# Patient Record
Sex: Female | Born: 1940 | Race: White | Hispanic: Yes | Smoking: Former smoker
Health system: Southern US, Community
[De-identification: ages and names within clinical notes are randomized; demographics above are authoritative.]

## PROBLEM LIST (undated history)

## (undated) DIAGNOSIS — M797 Fibromyalgia: Secondary | ICD-10-CM

## (undated) DIAGNOSIS — K279 Peptic ulcer, site unspecified, unspecified as acute or chronic, without hemorrhage or perforation: Secondary | ICD-10-CM

## (undated) DIAGNOSIS — C8590 Non-Hodgkin lymphoma, unspecified, unspecified site: Secondary | ICD-10-CM

## (undated) DIAGNOSIS — G939 Disorder of brain, unspecified: Secondary | ICD-10-CM

## (undated) DIAGNOSIS — R519 Headache, unspecified: Secondary | ICD-10-CM

## (undated) DIAGNOSIS — G4700 Insomnia, unspecified: Secondary | ICD-10-CM

## (undated) DIAGNOSIS — R51 Headache: Secondary | ICD-10-CM

## (undated) DIAGNOSIS — E785 Hyperlipidemia, unspecified: Secondary | ICD-10-CM

## (undated) DIAGNOSIS — L409 Psoriasis, unspecified: Secondary | ICD-10-CM

## (undated) DIAGNOSIS — R001 Bradycardia, unspecified: Secondary | ICD-10-CM

## (undated) DIAGNOSIS — R778 Other specified abnormalities of plasma proteins: Secondary | ICD-10-CM

## (undated) DIAGNOSIS — D649 Anemia, unspecified: Secondary | ICD-10-CM

## (undated) DIAGNOSIS — M519 Unspecified thoracic, thoracolumbar and lumbosacral intervertebral disc disorder: Secondary | ICD-10-CM

## (undated) DIAGNOSIS — J309 Allergic rhinitis, unspecified: Secondary | ICD-10-CM

## (undated) DIAGNOSIS — I493 Ventricular premature depolarization: Secondary | ICD-10-CM

## (undated) DIAGNOSIS — F329 Major depressive disorder, single episode, unspecified: Secondary | ICD-10-CM

## (undated) DIAGNOSIS — F32A Depression, unspecified: Secondary | ICD-10-CM

## (undated) DIAGNOSIS — J341 Cyst and mucocele of nose and nasal sinus: Secondary | ICD-10-CM

## (undated) DIAGNOSIS — N390 Urinary tract infection, site not specified: Secondary | ICD-10-CM

## (undated) DIAGNOSIS — R5382 Chronic fatigue, unspecified: Secondary | ICD-10-CM

## (undated) DIAGNOSIS — F419 Anxiety disorder, unspecified: Secondary | ICD-10-CM

## (undated) DIAGNOSIS — M1990 Unspecified osteoarthritis, unspecified site: Secondary | ICD-10-CM

## (undated) DIAGNOSIS — T7840XA Allergy, unspecified, initial encounter: Secondary | ICD-10-CM

## (undated) DIAGNOSIS — R350 Frequency of micturition: Secondary | ICD-10-CM

## (undated) HISTORY — DX: Disorder of brain, unspecified: G93.9

## (undated) HISTORY — DX: Depression, unspecified: F32.A

## (undated) HISTORY — DX: Ventricular premature depolarization: I49.3

## (undated) HISTORY — DX: Fibromyalgia: M79.7

## (undated) HISTORY — DX: Headache, unspecified: R51.9

## (undated) HISTORY — DX: Headache: R51

## (undated) HISTORY — DX: Other specified abnormalities of plasma proteins: R77.8

## (undated) HISTORY — DX: Bradycardia, unspecified: R00.1

## (undated) HISTORY — PX: APPENDECTOMY: SHX54

## (undated) HISTORY — DX: Major depressive disorder, single episode, unspecified: F32.9

## (undated) HISTORY — DX: Non-Hodgkin lymphoma, unspecified, unspecified site: C85.90

## (undated) HISTORY — DX: Allergic rhinitis, unspecified: J30.9

## (undated) HISTORY — DX: Chronic fatigue, unspecified: R53.82

## (undated) HISTORY — DX: Cyst and mucocele of nose and nasal sinus: J34.1

## (undated) HISTORY — PX: ABDOMINAL HYSTERECTOMY: SHX81

## (undated) HISTORY — DX: Anxiety disorder, unspecified: F41.9

## (undated) HISTORY — DX: Allergy, unspecified, initial encounter: T78.40XA

## (undated) HISTORY — DX: Insomnia, unspecified: G47.00

## (undated) HISTORY — DX: Peptic ulcer, site unspecified, unspecified as acute or chronic, without hemorrhage or perforation: K27.9

## (undated) HISTORY — DX: Hyperlipidemia, unspecified: E78.5

## (undated) HISTORY — DX: Unspecified osteoarthritis, unspecified site: M19.90

## (undated) HISTORY — DX: Anemia, unspecified: D64.9

## (undated) HISTORY — DX: Frequency of micturition: R35.0

## (undated) HISTORY — DX: Urinary tract infection, site not specified: N39.0

## (undated) HISTORY — DX: Unspecified thoracic, thoracolumbar and lumbosacral intervertebral disc disorder: M51.9

## (undated) HISTORY — DX: Psoriasis, unspecified: L40.9

---

## 2011-11-12 ENCOUNTER — Telehealth: Payer: Self-pay | Admitting: Internal Medicine

## 2011-11-12 NOTE — Telephone Encounter (Signed)
Message copied by Etheleen Sia on Tue Nov 12, 2011  8:25 AM ------      Message from: Etheleen Sia      Created: Tue Nov 05, 2011 11:36 AM      Regarding: FW: NEW PT       Phone didn't work.  Mailed a letter 6-11      ----- Message -----         From: Corwin Levins, MD         Sent: 11/04/2011   1:43 PM           To: Etheleen Sia      Subject: RE: NEW PT                                               Ok with me      ----- Message -----         From: Etheleen Sia         Sent: 11/04/2011   1:24 PM           To: Corwin Levins, MD      Subject: NEW PT                                                   Belinda Mcdowell would like to be worked in as a new [employer] patient before she runs out of medicine at the end of this month.  She moved from [place] at the end of May.

## 2011-11-15 ENCOUNTER — Encounter: Payer: Self-pay | Admitting: Internal Medicine

## 2011-11-15 DIAGNOSIS — Z0000 Encounter for general adult medical examination without abnormal findings: Secondary | ICD-10-CM | POA: Insufficient documentation

## 2011-11-18 ENCOUNTER — Encounter: Payer: Self-pay | Admitting: Internal Medicine

## 2011-11-18 ENCOUNTER — Other Ambulatory Visit (INDEPENDENT_AMBULATORY_CARE_PROVIDER_SITE_OTHER): Payer: Self-pay

## 2011-11-18 ENCOUNTER — Ambulatory Visit (INDEPENDENT_AMBULATORY_CARE_PROVIDER_SITE_OTHER): Payer: Medicare Other | Admitting: Internal Medicine

## 2011-11-18 VITALS — BP 132/80 | HR 57 | Temp 98.1°F | Ht 61.0 in | Wt 150.4 lb

## 2011-11-18 DIAGNOSIS — D649 Anemia, unspecified: Secondary | ICD-10-CM

## 2011-11-18 DIAGNOSIS — F329 Major depressive disorder, single episode, unspecified: Secondary | ICD-10-CM | POA: Insufficient documentation

## 2011-11-18 DIAGNOSIS — F32A Depression, unspecified: Secondary | ICD-10-CM | POA: Insufficient documentation

## 2011-11-18 DIAGNOSIS — C8589 Other specified types of non-Hodgkin lymphoma, extranodal and solid organ sites: Secondary | ICD-10-CM

## 2011-11-18 DIAGNOSIS — K279 Peptic ulcer, site unspecified, unspecified as acute or chronic, without hemorrhage or perforation: Secondary | ICD-10-CM

## 2011-11-18 DIAGNOSIS — G4700 Insomnia, unspecified: Secondary | ICD-10-CM | POA: Insufficient documentation

## 2011-11-18 DIAGNOSIS — Z8572 Personal history of non-Hodgkin lymphomas: Secondary | ICD-10-CM | POA: Insufficient documentation

## 2011-11-18 DIAGNOSIS — M797 Fibromyalgia: Secondary | ICD-10-CM

## 2011-11-18 DIAGNOSIS — F419 Anxiety disorder, unspecified: Secondary | ICD-10-CM | POA: Insufficient documentation

## 2011-11-18 DIAGNOSIS — I4891 Unspecified atrial fibrillation: Secondary | ICD-10-CM

## 2011-11-18 DIAGNOSIS — C8590 Non-Hodgkin lymphoma, unspecified, unspecified site: Secondary | ICD-10-CM

## 2011-11-18 DIAGNOSIS — J309 Allergic rhinitis, unspecified: Secondary | ICD-10-CM

## 2011-11-18 DIAGNOSIS — M519 Unspecified thoracic, thoracolumbar and lumbosacral intervertebral disc disorder: Secondary | ICD-10-CM | POA: Insufficient documentation

## 2011-11-18 DIAGNOSIS — E785 Hyperlipidemia, unspecified: Secondary | ICD-10-CM | POA: Insufficient documentation

## 2011-11-18 DIAGNOSIS — L409 Psoriasis, unspecified: Secondary | ICD-10-CM | POA: Insufficient documentation

## 2011-11-18 DIAGNOSIS — Z0000 Encounter for general adult medical examination without abnormal findings: Secondary | ICD-10-CM

## 2011-11-18 DIAGNOSIS — R002 Palpitations: Secondary | ICD-10-CM | POA: Insufficient documentation

## 2011-11-18 DIAGNOSIS — M1990 Unspecified osteoarthritis, unspecified site: Secondary | ICD-10-CM | POA: Insufficient documentation

## 2011-11-18 DIAGNOSIS — J341 Cyst and mucocele of nose and nasal sinus: Secondary | ICD-10-CM

## 2011-11-18 DIAGNOSIS — Z79899 Other long term (current) drug therapy: Secondary | ICD-10-CM

## 2011-11-18 HISTORY — DX: Psoriasis, unspecified: L40.9

## 2011-11-18 HISTORY — DX: Unspecified osteoarthritis, unspecified site: M19.90

## 2011-11-18 HISTORY — DX: Cyst and mucocele of nose and nasal sinus: J34.1

## 2011-11-18 HISTORY — DX: Fibromyalgia: M79.7

## 2011-11-18 HISTORY — DX: Anemia, unspecified: D64.9

## 2011-11-18 HISTORY — DX: Non-Hodgkin lymphoma, unspecified, unspecified site: C85.90

## 2011-11-18 HISTORY — DX: Unspecified thoracic, thoracolumbar and lumbosacral intervertebral disc disorder: M51.9

## 2011-11-18 HISTORY — DX: Peptic ulcer, site unspecified, unspecified as acute or chronic, without hemorrhage or perforation: K27.9

## 2011-11-18 LAB — LIPID PANEL
HDL: 44 mg/dL (ref >39.00)
Triglycerides: 274 mg/dL — ABNORMAL HIGH (ref 0.0–149.0)

## 2011-11-18 LAB — CBC WITH DIFFERENTIAL/PLATELET
Basophils Relative: 0.8 % (ref 0.0–3.0)
Eosinophils Relative: 4.5 % (ref 0.0–5.0)
HCT: 40.5 % (ref 36.0–46.0)
Hemoglobin: 13.6 g/dL (ref 12.0–15.0)
Lymphs Abs: 1.7 10*3/uL (ref 0.7–4.0)
Monocytes Relative: 7.6 % (ref 3.0–12.0)
Neutro Abs: 4.4 10*3/uL (ref 1.4–7.7)
WBC: 7.1 10*3/uL (ref 4.5–10.5)

## 2011-11-18 LAB — BASIC METABOLIC PANEL
BUN: 15 mg/dL (ref 6–23)
Calcium: 9.8 mg/dL (ref 8.4–10.5)
GFR: 90.41 mL/min (ref >60.00)
Glucose, Bld: 92 mg/dL (ref 70–99)
Sodium: 139 mEq/L (ref 135–145)

## 2011-11-18 LAB — HEPATIC FUNCTION PANEL
Albumin: 4.1 g/dL (ref 3.5–5.2)
Alkaline Phosphatase: 69 U/L (ref 39–117)

## 2011-11-18 LAB — URINALYSIS, ROUTINE W REFLEX MICROSCOPIC
Ketones, ur: NEGATIVE
Specific Gravity, Urine: 1.02 (ref 1.000–1.030)
Urine Glucose: NEGATIVE
pH: 6 (ref 5.0–8.0)

## 2011-11-18 LAB — LDL CHOLESTEROL, DIRECT: Direct LDL: 80.1 mg/dL

## 2011-11-18 MED ORDER — CYCLOBENZAPRINE HCL 5 MG PO TABS: 5.0000 mg | ORAL_TABLET | Freq: Three times a day (TID) | ORAL | Status: DC | PRN

## 2011-11-18 MED ORDER — ATENOLOL 50 MG PO TABS: 50.0000 mg | ORAL_TABLET | Freq: Every day | ORAL | Status: DC

## 2011-11-18 MED ORDER — ASPIRIN 81 MG PO TBEC: 81.0000 mg | DELAYED_RELEASE_TABLET | Freq: Every day | ORAL | Status: AC

## 2011-11-18 MED ORDER — FAMOTIDINE 20 MG PO TABS: 20.0000 mg | ORAL_TABLET | Freq: Two times a day (BID) | ORAL | Status: DC

## 2011-11-18 MED ORDER — FEXOFENADINE HCL 180 MG PO TABS: 180.0000 mg | ORAL_TABLET | Freq: Every day | ORAL | Status: DC

## 2011-11-18 MED ORDER — MECLIZINE HCL 25 MG PO TABS: 25.0000 mg | ORAL_TABLET | Freq: Three times a day (TID) | ORAL | Status: DC | PRN

## 2011-11-18 MED ORDER — ZOLPIDEM TARTRATE 10 MG PO TABS: 10.0000 mg | ORAL_TABLET | Freq: Every evening | ORAL | Status: DC | PRN

## 2011-11-18 MED ORDER — PAROXETINE HCL 20 MG PO TABS: 20.0000 mg | ORAL_TABLET | ORAL | Status: DC

## 2011-11-18 MED ORDER — ATORVASTATIN CALCIUM 20 MG PO TABS: 20.0000 mg | ORAL_TABLET | Freq: Every day | ORAL | Status: DC

## 2011-11-18 MED ORDER — ERGOTAMINE-CAFFEINE 1-100 MG PO TABS: ORAL_TABLET | ORAL | Status: DC

## 2011-11-18 MED ORDER — ALPRAZOLAM 0.5 MG PO TABS: 0.5000 mg | ORAL_TABLET | Freq: Two times a day (BID) | ORAL | Status: DC | PRN

## 2011-11-18 MED ORDER — DIGOXIN 250 MCG PO TABS: 250.0000 ug | ORAL_TABLET | Freq: Every day | ORAL | Status: DC

## 2011-11-18 NOTE — Patient Instructions (Addendum)
Please call with the name of your mail-in pharmacy and ask the nurse to send 90 day refills You are given the short term refills today, including alprazolam  You are up to date with prevention today Continue all other medications as before You will be contacted regarding the referral for: cardiology, and oncology Please go to LAB in the Basement for the blood and/or urine tests to be done today You will be contacted by phone if any changes need to be made immediately.  Otherwise, you will receive a letter about your results with an explanation. Please return in 6 mo with Lab testing done 3-5 days before

## 2011-11-18 NOTE — Assessment & Plan Note (Signed)

## 2011-11-19 ENCOUNTER — Encounter: Payer: Self-pay | Admitting: Internal Medicine

## 2011-11-21 ENCOUNTER — Other Ambulatory Visit: Payer: Self-pay

## 2011-11-21 ENCOUNTER — Telehealth: Payer: Self-pay | Admitting: Hematology and Oncology

## 2011-11-21 DIAGNOSIS — J309 Allergic rhinitis, unspecified: Secondary | ICD-10-CM

## 2011-11-21 MED ORDER — CYCLOBENZAPRINE HCL 5 MG PO TABS: 5.0000 mg | ORAL_TABLET | Freq: Three times a day (TID) | ORAL | Status: DC | PRN

## 2011-11-21 MED ORDER — ALPRAZOLAM 0.5 MG PO TABS: 0.5000 mg | ORAL_TABLET | Freq: Two times a day (BID) | ORAL | Status: AC | PRN

## 2011-11-21 MED ORDER — PAROXETINE HCL 20 MG PO TABS: 20.0000 mg | ORAL_TABLET | ORAL | Status: DC

## 2011-11-21 MED ORDER — MECLIZINE HCL 25 MG PO TABS: 25.0000 mg | ORAL_TABLET | Freq: Three times a day (TID) | ORAL | Status: DC | PRN

## 2011-11-21 MED ORDER — FEXOFENADINE HCL 180 MG PO TABS: 180.0000 mg | ORAL_TABLET | Freq: Every day | ORAL | Status: DC

## 2011-11-21 MED ORDER — FAMOTIDINE 20 MG PO TABS: 20.0000 mg | ORAL_TABLET | Freq: Two times a day (BID) | ORAL | Status: DC

## 2011-11-21 MED ORDER — DIGOXIN 250 MCG PO TABS: 250.0000 ug | ORAL_TABLET | Freq: Every day | ORAL | Status: DC

## 2011-11-21 MED ORDER — ATORVASTATIN CALCIUM 20 MG PO TABS: 20.0000 mg | ORAL_TABLET | Freq: Every day | ORAL | Status: DC

## 2011-11-21 MED ORDER — ZOLPIDEM TARTRATE 10 MG PO TABS: 10.0000 mg | ORAL_TABLET | Freq: Every evening | ORAL | Status: DC | PRN

## 2011-11-21 MED ORDER — ATENOLOL 50 MG PO TABS: 50.0000 mg | ORAL_TABLET | Freq: Every day | ORAL | Status: DC

## 2011-11-21 NOTE — Telephone Encounter (Signed)
Done hardcopy to robin  

## 2011-11-21 NOTE — Telephone Encounter (Signed)
S/w pt re appt for 7/16 @ 1 pm w/LO. D/t per pt.

## 2011-11-21 NOTE — Telephone Encounter (Signed)
Faxed hardcopy to pharmacy. 

## 2011-11-22 ENCOUNTER — Telehealth: Payer: Self-pay

## 2011-11-22 NOTE — Telephone Encounter (Signed)
Pharmacy informed.

## 2011-11-22 NOTE — Telephone Encounter (Signed)
Pharmacy sent fax requesting clarification on Zolpidem 10 mg.  Pharmacy stating medication prescribed is above recommended daily dosing. Please advise if ok to fill

## 2011-11-22 NOTE — Telephone Encounter (Signed)
Ok this time 

## 2011-11-24 ENCOUNTER — Encounter: Payer: Self-pay | Admitting: Internal Medicine

## 2011-11-24 NOTE — Assessment & Plan Note (Signed)
For oncology referral

## 2011-11-24 NOTE — Progress Notes (Signed)
Subjective:    Patient ID: Belinda Mcdowell, female    DOB: 02-22-1941, 71 y.o.   MRN: [id number]  HPI  Here for wellness and f/u;  Overall doing ok;  Pt denies CP, worsening SOB, DOE, wheezing, orthopnea, PND, worsening LE edema, palpitations, dizziness or syncope.  Pt denies neurological change such as new Headache, facial or extremity weakness.  Pt denies polydipsia, polyuria, or low sugar symptoms. Pt states overall good compliance with treatment and medications, good tolerability, and trying to follow lower cholesterol diet.  Pt denies worsening depressive symptoms, suicidal ideation or panic. No fever, wt loss, night sweats, loss of appetite, or other constitutional symptoms.  Pt states good ability with ADL's, low fall risk, home safety reviewed and adequate, no significant changes in hearing or vision, and occasionally active with exercise.  Needs local referral so establish with cardiology/oncology.  Does have several wks ongoing nasal allergy symptoms with clear congestion, itch and sneeze. Past Medical History  Diagnosis Date  . Cancer   . Generalized headaches   . UTI (lower urinary tract infection)   . Allergic rhinitis, cause unspecified   . Arthritis   . Depression   . Insomnia   . Hyperlipidemia   . Anxiety   . Non-Hodgkin lymphoma 11/18/2011    Stage 3  . Atrial fibrillation   . Fibromyalgia 11/18/2011  . Mucous retention cyst of maxillary sinus 11/18/2011    Right   . Psoriasis 11/18/2011    On MTX  . Anemia, unspecified 11/18/2011    Prior on aranesp  . PUD (peptic ulcer disease) 11/18/2011    Duodenal at age 89 and 8  . Degenerative joint disease 11/18/2011    diffuse  . Lumbar disc disease 11/18/2011   Past Surgical History  Procedure Date  . Appendectomy   . Abdominal hysterectomy     for dysfunctional bleeding    reports that she has never smoked. She has never used smokeless tobacco. She reports that she does not drink alcohol or use illicit drugs. family  history includes Arthritis in her other. No Known Allergies Current Outpatient Prescriptions on File Prior to Visit  Medication Sig Dispense Refill  . atenolol (TENORMIN) 50 MG tablet Take 1 tablet (50 mg total) by mouth daily.  90 tablet  3  . atorvastatin (LIPITOR) 20 MG tablet Take 1 tablet (20 mg total) by mouth daily.  90 tablet  3  . digoxin (LANOXIN) 0.25 MG tablet Take 1 tablet (250 mcg total) by mouth daily.  90 tablet  3  . ergotamine-caffeine (CAFERGOT) 1-100 MG per tablet Two tablets at onset of attack; then 1 tablet every 30 minutes as needed; maximum: 6 tablets per attack; do not exceed 10 tablets/week  100 tablet  0  . famotidine (PEPCID) 20 MG tablet Take 1 tablet (20 mg total) by mouth 2 (two) times daily.  180 tablet  3  . fexofenadine (ALLEGRA) 180 MG tablet Take 1 tablet (180 mg total) by mouth daily.  90 tablet  3  . PARoxetine (PAXIL) 20 MG tablet Take 1 tablet (20 mg total) by mouth every morning.  90 tablet  3  . zolpidem (AMBIEN) 10 MG tablet Take 1 tablet (10 mg total) by mouth at bedtime as needed.  30 tablet  5   Review of Systems Review of Systems  Constitutional: Negative for diaphoresis, activity change, appetite change and unexpected weight change.  HENT: Negative for hearing loss, ear pain, facial swelling, mouth sores and neck stiffness.  Eyes: Negative for pain, redness and visual disturbance.  Respiratory: Negative for shortness of breath and wheezing.   Cardiovascular: Negative for chest pain and palpitations.  Gastrointestinal: Negative for diarrhea, blood in stool, abdominal distention and rectal pain.  Genitourinary: Negative for hematuria, flank pain and decreased urine volume.  Musculoskeletal: Negative for myalgias and joint swelling.  Skin: Negative for color change and wound.  Neurological: Negative for syncope and numbness.  Hematological: Negative for adenopathy.  Psychiatric/Behavioral: Negative for hallucinations, self-injury, decreased  concentration and agitation.     Objective:   Physical Exam BP 132/80  Pulse 57  Temp 98.1 F (36.7 C) (Oral)  Ht 5\' 1"  (1.549 m)  Wt 150 lb 6 oz (68.21 kg)  BMI 28.41 kg/m2  SpO2 97% Physical Exam  VS noted Constitutional: Pt is oriented to person, place, and time. Appears well-developed and well-nourished.  HENT:  Head: Normocephalic and atraumatic.  Right Ear: External ear normal.  Left Ear: External ear normal.  Nose: Nose normal.  Mouth/Throat: Oropharynx is clear and moist.  Eyes: Conjunctivae and EOM are normal. Pupils are equal, round, and reactive to light.  Neck: Normal range of motion. Neck supple. No JVD present. No tracheal deviation present.  Cardiovascular: Normal rate, irregular rhythm, normal heart sounds and intact distal pulses.   Pulmonary/Chest: Effort normal and breath sounds normal.  Abdominal: Soft. Bowel sounds are normal. There is no tenderness.  Musculoskeletal: Normal range of motion. Exhibits no edema.  Lymphadenopathy:  Has no cervical adenopathy.  Neurological: Pt is alert and oriented to person, place, and time. Pt has normal reflexes. No cranial nerve deficit.  Skin: Skin is warm and dry. No rash noted.  Psychiatric:  Has  normal mood and affect. Behavior is normal.     Assessment & Plan:

## 2011-11-24 NOTE — Assessment & Plan Note (Signed)
stable overall by hx and exam, , and pt to continue medical treatment as before, for card referral

## 2011-11-24 NOTE — Assessment & Plan Note (Signed)
For allegra otc prn 

## 2011-11-26 ENCOUNTER — Telehealth: Payer: Self-pay | Admitting: Hematology and Oncology

## 2011-11-26 ENCOUNTER — Other Ambulatory Visit: Payer: Self-pay

## 2011-11-26 MED ORDER — CYCLOBENZAPRINE HCL 5 MG PO TABS: 5.0000 mg | ORAL_TABLET | Freq: Three times a day (TID) | ORAL | Status: AC | PRN

## 2011-11-26 MED ORDER — ERGOTAMINE-CAFFEINE 1-100 MG PO TABS: ORAL_TABLET | ORAL | Status: DC

## 2011-11-26 NOTE — Telephone Encounter (Signed)
Referred by Dr. Oliver Barre Dx- NHL

## 2011-11-27 ENCOUNTER — Telehealth: Payer: Self-pay

## 2011-11-27 DIAGNOSIS — L409 Psoriasis, unspecified: Secondary | ICD-10-CM

## 2011-11-27 MED ORDER — PREDNISONE 10 MG PO TABS: ORAL_TABLET | ORAL | Status: DC

## 2011-11-27 NOTE — Telephone Encounter (Signed)
Done per emr 

## 2011-11-27 NOTE — Telephone Encounter (Signed)
Patient informed. 

## 2011-11-27 NOTE — Telephone Encounter (Signed)
Pt called requesting prednisone taper pack for psoriasis flare of the face. Pt says she has taken Methotrexate for this but it has not helped. She is also requesting referral to Rheumatology.

## 2011-12-06 ENCOUNTER — Telehealth: Payer: Self-pay

## 2011-12-06 NOTE — Telephone Encounter (Signed)
A user error has taken place: encounter opened in error, closed for administrative reasons.

## 2011-12-10 ENCOUNTER — Encounter: Payer: Self-pay | Admitting: Hematology and Oncology

## 2011-12-10 ENCOUNTER — Ambulatory Visit: Payer: Medicare Other

## 2011-12-10 ENCOUNTER — Telehealth: Payer: Self-pay | Admitting: Hematology and Oncology

## 2011-12-10 ENCOUNTER — Ambulatory Visit (HOSPITAL_BASED_OUTPATIENT_CLINIC_OR_DEPARTMENT_OTHER): Payer: Medicare Other | Admitting: Hematology and Oncology

## 2011-12-10 VITALS — BP 150/76 | HR 53 | Temp 97.9°F | Ht 61.0 in | Wt 149.1 lb

## 2011-12-10 DIAGNOSIS — C8291 Follicular lymphoma, unspecified, lymph nodes of head, face, and neck: Secondary | ICD-10-CM

## 2011-12-10 DIAGNOSIS — E7800 Pure hypercholesterolemia, unspecified: Secondary | ICD-10-CM

## 2011-12-10 DIAGNOSIS — M359 Systemic involvement of connective tissue, unspecified: Secondary | ICD-10-CM

## 2011-12-10 DIAGNOSIS — C8590 Non-Hodgkin lymphoma, unspecified, unspecified site: Secondary | ICD-10-CM

## 2011-12-10 DIAGNOSIS — I4891 Unspecified atrial fibrillation: Secondary | ICD-10-CM

## 2011-12-10 NOTE — Progress Notes (Signed)
Dr.    Daneil Dan     -     Oncologist/Hematologist  In  [place]. Dr.    Oliver Barre      -    Primary. Dr.    Dareen Piano      -     Rheumatologist.  [facility]   On   [address]  Prefers   Home  Phone  For   Voice   Mail      [phone].

## 2011-12-10 NOTE — Telephone Encounter (Signed)
appts made and printed for pt aom °

## 2011-12-10 NOTE — Progress Notes (Signed)
This office note has been dictated.

## 2011-12-10 NOTE — Patient Instructions (Addendum)
Belinda Mcdowell  [id number]  [facility] Discharge Instructions  RECOMMENDATIONS MADE BY THE CONSULTANT AND ANY TEST RESULTS WILL BE SENT TO YOUR REFERRING DOCTOR.   EXAM FINDINGS BY MD TODAY AND SIGNS AND SYMPTOMS TO REPORT TO CLINIC OR PRIMARY MD:   Your current list of medications are: Current Outpatient Prescriptions  Medication Sig Dispense Refill  . ALPRAZolam (XANAX) 0.5 MG tablet Take 1 tablet (0.5 mg total) by mouth 2 (two) times daily as needed for sleep.  60 tablet  5  . aspirin 81 MG EC tablet Take 1 tablet (81 mg total) by mouth daily. Swallow whole.  30 tablet  12  . atenolol (TENORMIN) 50 MG tablet Take 1 tablet (50 mg total) by mouth daily.  90 tablet  3  . atorvastatin (LIPITOR) 20 MG tablet Take 1 tablet (20 mg total) by mouth daily.  90 tablet  3  . digoxin (LANOXIN) 0.25 MG tablet Take 1 tablet (250 mcg total) by mouth daily.  90 tablet  3  . ergotamine-caffeine (CAFERGOT) 1-100 MG per tablet Two tablets at onset of attack; then 1 tablet every 30 minutes as needed; maximum: 6 tablets per attack; do not exceed 10 tablets/week  100 tablet  0  . famotidine (PEPCID) 20 MG tablet Take 1 tablet (20 mg total) by mouth 2 (two) times daily.  180 tablet  3  . fexofenadine (ALLEGRA) 180 MG tablet Take 1 tablet (180 mg total) by mouth daily.  90 tablet  3  . meclizine (ANTIVERT) 25 MG tablet Take 1 tablet (25 mg total) by mouth 3 (three) times daily as needed.  180 tablet  3  . PARoxetine (PAXIL) 20 MG tablet Take 1 tablet (20 mg total) by mouth every morning.  90 tablet  3  . predniSONE (DELTASONE) 10 MG tablet 3 tabs by mouth per day for 3 days,2tabs per day for 3 days,1tab per day for 3 days  18 tablet  0  . zolpidem (AMBIEN) 10 MG tablet Take 1 tablet (10 mg total) by mouth at bedtime as needed.  30 tablet  5     INSTRUCTIONS GIVEN AND DISCUSSED:   SPECIAL INSTRUCTIONS/FOLLOW-UP:  See above.  I acknowledge that I have been informed and understand all the  instructions given to me and received a copy. I do not have any more questions at this time, but understand that I may call the [facility] at [phone] during business hours should I have any further questions or need assistance in obtaining follow-up care.

## 2011-12-11 NOTE — Progress Notes (Signed)
CC:   Corwin Levins, MD Ann Maki, MD, Fax [phone] Azzie Roup, MD  IDENTIFYING STATEMENT:  The patient is a 71 year old woman seen at the request of Dr. Jonny Ruiz with lymphoma.  HISTORY OF PRESENT ILLNESS:  The patient relocated to the [place] area from [place], [place], 2 months ago to be close to family.  She presents for continued oncology care.  After reviewing her chart, her history is summarized as follows:  She presented in 2005 with a large right mandibular mass.  She underwent an excision of the mass at the right cheek and mandibular area.  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  Staging PET scan had shown bilateral cervical lymphadenopathy, abnormal uptake in small bilateral axillary lymph nodes, abnormal bilateral pelvic lymphadenopathy.  She was staged as a stage III.  She was treated with 8 cycles of Rituxan, fludarabine, Novantrone, Decadron, completing therapy in March 2006.  Thereafter, she received 2 years of maintenance Rituxan therapy.  The patient reports that the treatment process was complicated with pancytopenia.  Otherwise, she did reasonably well.  Her last staging PET scan on 05/02/2011 done in [place] revealed no evidence of disease.  Since her move, the patient has noticed she feels remarkably well.  She feels that the [place] "weather" suits her very well. She has no fever, chills, or night sweats.  She has not lost any weight. She denies new adenopathy.  PAST MEDICAL HISTORY: 1. Autoimmune disease on methotrexate.  2.  History of     hypercholesteremia.  3.  History of seasonal allergies.  4.  Atrial     fibrillation.  5.  Tinnitus.  6.  History of duodenal ulcers at age     44 and 49.  7.  Status post total abdominal hysterectomy for     uterine fibroids.  ALLERGIES:  None.  MEDICATIONS:  Lipitor 20 mg daily, Paxil 20 mg daily, atenolol 50 mg daily, digoxin 50 mcg daily, aspirin 81 mg  daily, Xanax 0.5 mg t.i.d. p.r.n., Cafergot 1/100 p.r.n. for migraines, Pepcid 20 mg b.i.d., Allegra 180 mg p.r.n., Antivert 25 mg t.i.d. p.r.n., prednisone 10 mg daily, Ambien 10 mg daily p.r.n., methotrexate.  SOCIAL HISTORY:  The patient is widowed with 3 children.  One of her children lives in [place].  She denies alcohol or tobacco use.  She is a retired Engineer, site.  FAMILY HISTORY:  Positive for liver cancer in a paternal aunt, otherwise negative.  HEALTH MAINTENANCE:  Last colonoscopy performed around 2010 in [place]. The patient reports this as being unremarkable.  Mammogram:  She is due for mammogram.  REVIEW OF SYSTEMS:  Denies fever, chills, night sweats, anorexia, adenopathy pain.  GI:  Denies nausea, vomiting, abdominal pain, diarrhea, melena, hematochezia.  Cardiovascular:  Denies chest pain, PND, orthopnea, ankle swelling.  Respiration:  Denies cough, hemoptysis, wheeze, shortness of breath.  Skin:  Denies bruising, bleeding. Musculoskeletal:  Denies joint aches, muscle pains.  Neurologic:  Denies headaches, vision change, extremity weakness.  Rest of review of systems negative.  PHYSICAL EXAMINATION:  General:  The patient is a well-appearing, well- nourished woman in no distress.  Vitals:  Pulse 53, blood pressure 150/76, temperature 97.9, respirations 20, weight 149 pounds.  HEENT: Head is atraumatic, normocephalic.  Sclerae anicteric.  Pupils equal, round, and reactive to light.  Mouth moist without ulcerations, thrush, or lesions.  Neck:  Supple.  Chest:  Clear to both percussion and auscultation.  Cardiovascular:  1st and 2nd heart sounds present.  No added sounds or murmurs.  Abdomen:  Soft, nontender.  No palpable masses.  No hepatomegaly.  Bowel sounds present.  Lymph Nodes:  No palpable cervical, axilla, or inguinal adenopathy.  Skin:  No bruising or petechiae.  Extremities:  Trace 1 edema bilaterally with no calf tenderness.  CNS:   Nonfocal.  LABORATORY DATA:  Performed at Dr. Melvyn Novas office on 11/18/2011:  White cell count 7.1, hemoglobin 13.6, hematocrit 40.5, platelets 154.  Sodium 139, potassium 5.2, chloride 101, CO2 30, BUN 15, creatinine 0.7, glucose 92, T Bili 0.7, alkaline phosphatase 69, AST 45, ALT 36, LDH pending.  IMPRESSION AND PLAN:  Mrs. Omura is a pleasant 71 year old woman with history of a stage III, grade 1 follicular lymphoma diagnosed in 2005, diagnosed and treated in [place].  Received 8 cycles of Rituxan with fludarabine, Novantrone, and Decadron until March 2006 achieving a complete remission.  Then went on to receive 2 years of Rituxan therapy. Her last staging PET 6 months ago showed no evidence of disease.  Her current followup exam and blood work also indicates no evidence of disease.  At this point, she has been receiving PET scans annually. She has been NED for at least 8 years.  Reasonable to obtain a CT of the neck, chest, abdomen, and pelvis in 6 months' time.  If she continues to have no evidence to disease, would recommend scaling out radiographic studies and just proceeding with routine clinic visit.  Health maintenance notes she is due for a gynecological evaluation.  I will refer her to see Dr. Reynaldo Minium.  I have stressed the importance of healthy diet and exercise.  We spent more than half the time in clinical care and discussion.  She had a number of questions answered.  She follows up in 6 months' time.    ______________________________ Laurice Record, M.D. LIO/MEDQ  D:  12/10/2011  T:  12/10/2011  Job:  [id number]

## 2011-12-12 ENCOUNTER — Telehealth: Payer: Self-pay

## 2011-12-12 NOTE — Telephone Encounter (Signed)
[facility] called to inform of contraindication for the patient receiving Cafergot with history of MI/ heart disease.  The patient states she has always gotten this medication, but pharmacy will not fill until ok by MD.  Please advise if ok for pharmacy to fill with knowledge of contraindication.  Call back number is [phone] and ref. Number is [id number] PLEASE ADVISE as patient upset as she has not received this medication.

## 2011-12-12 NOTE — Telephone Encounter (Signed)
Ok for fill?

## 2011-12-12 NOTE — Telephone Encounter (Signed)
[facility] informed ok to fill per MD instructions.  Called the patient informed prescription problem has been taken care of and medication will be mailed asap per [facility] pharmacist.

## 2011-12-19 ENCOUNTER — Ambulatory Visit: Payer: Medicare Other | Admitting: Internal Medicine

## 2012-01-06 ENCOUNTER — Ambulatory Visit: Payer: Medicare Other | Admitting: Gynecology

## 2012-02-07 ENCOUNTER — Ambulatory Visit (INDEPENDENT_AMBULATORY_CARE_PROVIDER_SITE_OTHER): Payer: Medicare Other | Admitting: Internal Medicine

## 2012-02-07 ENCOUNTER — Encounter: Payer: Self-pay | Admitting: Internal Medicine

## 2012-02-07 VITALS — BP 161/82 | HR 65 | Ht 61.0 in | Wt 157.1 lb

## 2012-02-07 DIAGNOSIS — I4891 Unspecified atrial fibrillation: Secondary | ICD-10-CM

## 2012-02-07 DIAGNOSIS — R002 Palpitations: Secondary | ICD-10-CM

## 2012-02-07 MED ORDER — METOPROLOL SUCCINATE ER 50 MG PO TB24: 50.0000 mg | ORAL_TABLET | Freq: Every day | ORAL | Status: DC

## 2012-02-07 NOTE — Patient Instructions (Signed)
Your physician has recommended you make the following change in your medication:  1) Stop atenolol. 2) Start metoprolol succinate 25 mg one tablet by mouth once daily.  Your physician has requested that you have an echocardiogram. Echocardiography is a painless test that uses sound waves to create images of your heart. It provides your doctor with information about the size and shape of your heart and how well your heart's chambers and valves are working. This procedure takes approximately one hour. There are no restrictions for this procedure.  Your physician has recommended that you wear a 48 hour holter monitor. Holter monitors are medical devices that record the heart's electrical activity. Doctors most often use these monitors to diagnose arrhythmias. Arrhythmias are problems with the speed or rhythm of the heartbeat. The monitor is a small, portable device. You can wear one while you do your normal daily activities. This is usually used to diagnose what is causing palpitations/syncope (passing out).  Your physician recommends that you schedule a follow-up appointment in: 2-3 weeks with Dr. Graciela Husbands.

## 2012-02-07 NOTE — Progress Notes (Signed)
ELECTROPHYSIOLOGY CONSULT NOTE  Patient ID: Belinda Mcdowell, MRN: [id number], DOB/AGE: 72-Oct-1942 72 y.o. Admit date: (Not on file) Date of Consult: 02/07/2012  Primary Physician: Oliver Barre, MD Primary Cardiologist:    Chief Complaint:  Atrial fibrillation   HPI Belinda Mcdowell is a 71 y.o. female  Hx of lymphoma in 2005 Rx  Rituxan fludabaine  She has been in remission now for 8 years  She carries a history of atrial fibrillation which she has daily. Left ventricular function status is not known  Past Medical History  Diagnosis Date  . Cancer   . Generalized headaches   . UTI (lower urinary tract infection)   . Allergic rhinitis, cause unspecified   . Arthritis   . Depression   . Insomnia   . Hyperlipidemia   . Anxiety   . Non-Hodgkin lymphoma 11/18/2011    Stage 3  . Atrial fibrillation   . Fibromyalgia 11/18/2011  . Mucous retention cyst of maxillary sinus 11/18/2011    Right   . Psoriasis 11/18/2011    On MTX  . Anemia, unspecified 11/18/2011    Prior on aranesp  . PUD (peptic ulcer disease) 11/18/2011    Duodenal at age 26 and 53  . Degenerative joint disease 11/18/2011    diffuse  . Lumbar disc disease 11/18/2011      Surgical History:  Past Surgical History  Procedure Date  . Appendectomy   . Abdominal hysterectomy     for dysfunctional bleeding     Home Meds: Prior to Admission medications   Medication Sig Start Date End Date Taking? Authorizing Provider  ALPRAZolam Prudy Feeler) 0.5 MG tablet Take 0.5 mg by mouth as needed.   Yes Historical Provider, MD  aspirin 81 MG EC tablet Take 1 tablet (81 mg total) by mouth daily. Swallow whole. 11/18/11 11/17/12 Yes Corwin Levins, MD  atenolol (TENORMIN) 50 MG tablet Take 1 tablet (50 mg total) by mouth daily. 11/21/11 11/20/12 Yes Corwin Levins, MD  atorvastatin (LIPITOR) 20 MG tablet Take 1 tablet (20 mg total) by mouth daily. 11/21/11  Yes Corwin Levins, MD  digoxin (LANOXIN) 0.25 MG tablet Take 1 tablet (250 mcg total) by  mouth daily. 11/21/11 11/20/12 Yes Corwin Levins, MD  famotidine (PEPCID) 20 MG tablet Take 1 tablet (20 mg total) by mouth 2 (two) times daily. 11/21/11 11/20/12 Yes Corwin Levins, MD  methotrexate (RHEUMATREX) 2.5 MG tablet Take 7.5 mg by mouth once a week.   Yes Historical Provider, MD  PARoxetine (PAXIL) 20 MG tablet Take 1 tablet (20 mg total) by mouth every morning. 11/21/11 11/20/12 Yes Corwin Levins, MD  zolpidem (AMBIEN) 10 MG tablet Take 1 tablet (10 mg total) by mouth at bedtime as needed. 11/21/11  Yes Corwin Levins, MD     Allergies: No Known Allergies  History   Social History  . Marital Status: Single    Spouse Name: N/A    Number of Children: N/A  . Years of Education: 16   Occupational History  . Teacher, Retired    Social History Main Topics  . Smoking status: Former Smoker -- 0.2 packs/day for 15 years    Types: Cigarettes  . Smokeless tobacco: Never Used  . Alcohol Use: No  . Drug Use: No  . Sexually Active: Not on file   Other Topics Concern  . Not on file   Social History Narrative  . No narrative on file     Family History  Problem  Relation Age of Onset  . Arthritis Other      ROS:  Please see the history of present illness.    All other systems reviewed and negative.    Physical Exam: Blood pressure 161/82, pulse 65, height 5\' 1"  (1.549 m), weight 157 lb 1.9 oz (71.269 kg). General: Well developed, well nourished female in no acute distress. Head: Normocephalic, atraumatic, sclera non-icteric, no xanthomas, nares are without discharge. Lymph Nodes:  none Back: without scoliosis/kyphosis, no CVA tendersness Neck: Negative for carotid bruits. JVD not elevated. Lungs: Clear bilaterally to auscultation without wheezes, rales, or rhonchi. Breathing is unlabored. Heart: RRR with S1 S2. No  murmur , rubs, or gallops appreciated. Abdomen: Soft, non-tender, non-distended with normoactive bowel sounds. No hepatomegaly. No rebound/guarding. No obvious abdominal  masses. Msk:  Strength and tone appear normal for age. Extremities: No clubbing or cyanosis. No  edema.  Distal pedal pulses are 2+ and equal bilaterally. Skin: Warm and Dry Neuro: Alert and oriented X 3. CN III-XII intact Grossly normal sensory and motor function . Psych:  Responds to questions appropriately with a normal affect.      Labs: Cardiac Enzymes No results found for this basename: CKTOTAL:4,CKMB:4,TROPONINI:4 in the last 72 hours CBC Lab Results  Component Value Date   WBC 7.1 11/18/2011   HGB 13.6 11/18/2011   HCT 40.5 11/18/2011   MCV 87.8 11/18/2011   PLT 154.0 11/18/2011   PROTIME: No results found for this basename: LABPROT:3,INR:3 in the last 72 hours Chemistry No results found for this basename: NA,K,CL,CO2,BUN,CREATININE,CALCIUM,LABALBU,PROT,BILITOT,ALKPHOS,ALT,AST,GLUCOSE in the last 168 hours Lipids Lab Results  Component Value Date   CHOL 148 11/18/2011   HDL 44.00 11/18/2011   TRIG 274.0* 11/18/2011   BNP No results found for this basename: probnp   Miscellaneous No results found for this basename: DDIMER    Radiology/Studies:  No results found.  EKG:NSR  Assessment and Plan:  Belinda Mcdowell

## 2012-02-13 ENCOUNTER — Ambulatory Visit (HOSPITAL_COMMUNITY): Payer: Medicare Other | Attending: Cardiology | Admitting: Radiology

## 2012-02-13 DIAGNOSIS — I4891 Unspecified atrial fibrillation: Secondary | ICD-10-CM | POA: Insufficient documentation

## 2012-02-13 DIAGNOSIS — I369 Nonrheumatic tricuspid valve disorder, unspecified: Secondary | ICD-10-CM | POA: Insufficient documentation

## 2012-02-13 DIAGNOSIS — I059 Rheumatic mitral valve disease, unspecified: Secondary | ICD-10-CM | POA: Insufficient documentation

## 2012-02-13 NOTE — Progress Notes (Signed)
Echocardiogram performed.  

## 2012-02-18 ENCOUNTER — Ambulatory Visit: Payer: Medicare Other | Admitting: Gynecology

## 2012-02-25 ENCOUNTER — Ambulatory Visit (INDEPENDENT_AMBULATORY_CARE_PROVIDER_SITE_OTHER): Payer: Medicare Other | Admitting: Internal Medicine

## 2012-02-25 ENCOUNTER — Encounter: Payer: Self-pay | Admitting: Internal Medicine

## 2012-02-25 VITALS — BP 126/76 | HR 59 | Ht 61.0 in | Wt 159.8 lb

## 2012-02-25 DIAGNOSIS — R002 Palpitations: Secondary | ICD-10-CM

## 2012-02-25 DIAGNOSIS — R6889 Other general symptoms and signs: Secondary | ICD-10-CM | POA: Insufficient documentation

## 2012-02-25 DIAGNOSIS — R001 Bradycardia, unspecified: Secondary | ICD-10-CM

## 2012-02-25 DIAGNOSIS — I498 Other specified cardiac arrhythmias: Secondary | ICD-10-CM

## 2012-02-25 NOTE — Assessment & Plan Note (Signed)
We will discontinue her digoxin and wean her off of her beta blocker. If this will improve heart rate excursion and exercise tolerance

## 2012-02-25 NOTE — Assessment & Plan Note (Signed)
As noted this was thought to be atrial fibrillation. A 48 hour Holter monitor  will be undertaken to identify the mechanism of her daily palpitations and clarify whether in fact atrial fibrillation is present as this would have implications to anticoagulation and other management. We'll also undertake a 2-D echo to look at left ventricular function

## 2012-02-25 NOTE — Assessment & Plan Note (Signed)
Hopefully we can ascribe this to bradycardia with this resolution. Will also discontinue her statin at this time it is not clear to me that is indicated. She denies coronary artery disease. She does not have treated hypertension.

## 2012-02-25 NOTE — Patient Instructions (Addendum)
Stop taking Lipitor and Pepcid.  Continue Metoprolol every other day for 5 days then stop all together.  Your physician recommends that you schedule a follow-up appointment in: 6-8 weeks with Dr. Graciela Husbands.

## 2012-02-25 NOTE — Progress Notes (Signed)
Patient Care Team: Corwin Levins, MD as PCP - General (Internal Medicine)   HPI  Belinda Mcdowell is a 71 y.o. female Seen in followup for a questionable diagnosis of atrial fibrillation which she was having symptoms of almost daily. She has a history of chemotherapy for lymphoma. Since we saw her last,, because of fatigue, we have changed her atenolol-metoprolol succinate to see if that would have any impact. It did not. Holter monitor demonstrated PVCs that correlated with her symptoms of "atrial fibrillation". Echocardiogram demonstrated normal left ventricular function; mild left atrial enlargement (September 2013) Her major complaint remains exercise intolerance  Past Medical History  Diagnosis Date  . Cancer   . Generalized headaches   . UTI (lower urinary tract infection)   . Allergic rhinitis, cause unspecified   . Arthritis   . Depression   . Insomnia   . Hyperlipidemia   . Anxiety   . Non-Hodgkin lymphoma 11/18/2011    Stage 3  . Atrial fibrillation   . Fibromyalgia 11/18/2011  . Mucous retention cyst of maxillary sinus 11/18/2011    Right   . Psoriasis 11/18/2011    On MTX  . Anemia, unspecified 11/18/2011    Prior on aranesp  . PUD (peptic ulcer disease) 11/18/2011    Duodenal at age 6 and 64  . Degenerative joint disease 11/18/2011    diffuse  . Lumbar disc disease 11/18/2011    Past Surgical History  Procedure Date  . Appendectomy   . Abdominal hysterectomy     for dysfunctional bleeding    Current Outpatient Prescriptions  Medication Sig Dispense Refill  . ALPRAZolam (XANAX) 0.5 MG tablet Take 0.5 mg by mouth as needed.      Marland Kitchen aspirin 81 MG EC tablet Take 1 tablet (81 mg total) by mouth daily. Swallow whole.  30 tablet  12  . atorvastatin (LIPITOR) 20 MG tablet Take 1 tablet (20 mg total) by mouth daily.  90 tablet  3  . digoxin (LANOXIN) 0.25 MG tablet Take 1 tablet (250 mcg total) by mouth daily.  90 tablet  3  . famotidine (PEPCID) 20 MG tablet Take 1  tablet (20 mg total) by mouth 2 (two) times daily.  180 tablet  3  . methotrexate (RHEUMATREX) 2.5 MG tablet Take 7.5 mg by mouth once a week.      . metoprolol succinate (TOPROL-XL) 50 MG 24 hr tablet Take 1 tablet (50 mg total) by mouth daily. Take with or immediately following a meal.  30 tablet  6  . PARoxetine (PAXIL) 20 MG tablet Take 1 tablet (20 mg total) by mouth every morning.  90 tablet  3  . zolpidem (AMBIEN) 10 MG tablet Take 1 tablet (10 mg total) by mouth at bedtime as needed.  30 tablet  5    No Known Allergies  Review of Systems negative except from HPI and PMH  Physical Exam BP 126/76  Pulse 59  Ht 5\' 1"  (1.549 m)  Wt 159 lb 12.8 oz (72.485 kg)  BMI 30.19 kg/m2  SpO2 96% Well developed and well nourished in no acute distress HENT normal E scleral and icterus clear Neck Supple JVP flat; carotids brisk and full Clear to ausculation Regular rate and rhythm, no murmurs gallops or rub Soft with active bowel sounds No clubbing cyanosis none Edema Alert and oriented, grossly normal motor and sensory function Skin Warm and Dry    Assessment and  Plan

## 2012-02-25 NOTE — Assessment & Plan Note (Signed)
Reviewed with the patient. The frequency is less than 1%. I have told her that they're benign.

## 2012-02-27 ENCOUNTER — Ambulatory Visit (INDEPENDENT_AMBULATORY_CARE_PROVIDER_SITE_OTHER): Payer: Medicare Other | Admitting: *Deleted

## 2012-02-27 DIAGNOSIS — Z23 Encounter for immunization: Secondary | ICD-10-CM

## 2012-04-28 ENCOUNTER — Other Ambulatory Visit: Payer: Self-pay

## 2012-04-28 MED ORDER — ALPRAZOLAM 0.5 MG PO TABS: 0.5000 mg | ORAL_TABLET | Freq: Every evening | ORAL | Status: DC | PRN

## 2012-04-28 MED ORDER — ZOLPIDEM TARTRATE 10 MG PO TABS: 10.0000 mg | ORAL_TABLET | Freq: Every evening | ORAL | Status: DC | PRN

## 2012-04-28 NOTE — Telephone Encounter (Signed)
Faxed hardcopy to pharmacy. 

## 2012-04-28 NOTE — Telephone Encounter (Signed)
Done hardcopy to robin  

## 2012-04-30 ENCOUNTER — Ambulatory Visit: Payer: Medicare Other | Admitting: Internal Medicine

## 2012-05-16 ENCOUNTER — Telehealth: Payer: Self-pay | Admitting: Oncology

## 2012-05-16 NOTE — Telephone Encounter (Signed)
Talked to patient, she is aware of appt lab and CT on 1/27 and she is aware that she is seeing Dr. Arline Asp on 06/24/11 for 1 hour

## 2012-05-29 ENCOUNTER — Ambulatory Visit: Payer: Self-pay | Admitting: Internal Medicine

## 2012-05-30 ENCOUNTER — Encounter: Payer: Self-pay | Admitting: *Deleted

## 2012-06-02 ENCOUNTER — Ambulatory Visit: Payer: Medicare Other | Admitting: Gynecology

## 2012-06-09 ENCOUNTER — Encounter: Payer: Self-pay | Admitting: Internal Medicine

## 2012-06-09 ENCOUNTER — Ambulatory Visit (INDEPENDENT_AMBULATORY_CARE_PROVIDER_SITE_OTHER): Payer: Medicare Other | Admitting: Internal Medicine

## 2012-06-09 VITALS — BP 132/90 | HR 90 | Temp 98.4°F | Ht 61.0 in | Wt 163.1 lb

## 2012-06-09 DIAGNOSIS — F32A Depression, unspecified: Secondary | ICD-10-CM

## 2012-06-09 DIAGNOSIS — E785 Hyperlipidemia, unspecified: Secondary | ICD-10-CM

## 2012-06-09 DIAGNOSIS — F3289 Other specified depressive episodes: Secondary | ICD-10-CM

## 2012-06-09 DIAGNOSIS — Z0000 Encounter for general adult medical examination without abnormal findings: Secondary | ICD-10-CM

## 2012-06-09 DIAGNOSIS — H1045 Other chronic allergic conjunctivitis: Secondary | ICD-10-CM

## 2012-06-09 DIAGNOSIS — F329 Major depressive disorder, single episode, unspecified: Secondary | ICD-10-CM

## 2012-06-09 DIAGNOSIS — H1010 Acute atopic conjunctivitis, unspecified eye: Secondary | ICD-10-CM

## 2012-06-09 MED ORDER — CYCLOBENZAPRINE HCL 5 MG PO TABS: 5.0000 mg | ORAL_TABLET | Freq: Three times a day (TID) | ORAL | Status: DC | PRN

## 2012-06-09 MED ORDER — FAMOTIDINE 20 MG PO TABS: 20.0000 mg | ORAL_TABLET | Freq: Two times a day (BID) | ORAL | Status: DC

## 2012-06-09 NOTE — Progress Notes (Signed)
  Subjective:    Patient ID: Belinda Mcdowell, female    DOB: [birth year], 72 y.o.   MRN: [id number]  HPI  Here to f/u; overall doing ok,  Pt denies chest pain, increased sob or doe, wheezing, orthopnea, PND, increased LE swelling, palpitations, dizziness or syncope.  Pt denies polydipsia, polyuria,   Pt denies new neurological symptoms such as new headache, or facial or extremity weakness or numbness.   Pt states overall good compliance with meds, has been trying to follow lower cholesterol diet, with overall stable.  Dig stopped per cardiology, normal EF on echo sept 2013.    Saw Dr Dareen Piano rheumatology as well.   Pt denies fever, wt loss, night sweats, loss of appetite, No recent falls.  Denies worsening depressive symptoms, suicidal ideation, or panic.  Does have bilat eye itching/redness for over a wk Past Medical History  Diagnosis Date  . Generalized headaches   . UTI (lower urinary tract infection)   . Allergic rhinitis, cause unspecified   . Arthritis   . Depression   . Insomnia   . Hyperlipidemia   . Anxiety   . Non-Hodgkin lymphoma 11/18/2011    Stage 3  . Atrial fibrillation     by history; the third suggests PVCs  . Fibromyalgia 11/18/2011  . Mucous retention cyst of maxillary sinus 11/18/2011    Right   . Psoriasis 11/18/2011    On MTX  . Anemia, unspecified 11/18/2011    Prior on aranesp  . PUD (peptic ulcer disease) 11/18/2011    Duodenal at age 33 and 42  . Degenerative joint disease 11/18/2011    diffuse  . Lumbar disc disease 11/18/2011   Past Surgical History  Procedure Date  . Appendectomy   . Abdominal hysterectomy     for dysfunctional bleeding    reports that she has quit smoking. Her smoking use included Cigarettes. She has a 3.75 pack-year smoking history. She has never used smokeless tobacco. She reports that she does not drink alcohol or use illicit drugs. family history includes Arthritis in her other. No Known Allergies    Review of Systems  Constitutional: Negative for unexpected weight change, or unusual diaphoresis  HENT: Negative for tinnitus.   Eyes: Negative for photophobia and visual disturbance.  Respiratory: Negative for choking and stridor.   Gastrointestinal: Negative for vomiting and blood in stool.  Genitourinary: Negative for hematuria and decreased urine volume.  Musculoskeletal: Negative for acute joint swelling Skin: Negative for color change and wound.  Neurological: Negative for tremors and numbness other than noted  Psychiatric/Behavioral: Negative for decreased concentration or  hyperactivity.       Objective:   Physical Exam BP 132/90  Pulse 90  Temp 98.4 F (36.9 C) (Oral)  Ht 5\' 1"  (1.549 m)  Wt 163 lb 2 oz (73.993 kg)  BMI 30.82 kg/m2  SpO2 98% VS noted,  Constitutional: Pt appears well-developed and well-nourished.  HENT: Head: NCAT.  Right Ear: External ear normal.  Left Ear: External ear normal.  Eyes: Conjunctivae and EOM are normal. Pupils are equal, round, and reactive to light.  Neck: Normal range of motion. Neck supple.  Cardiovascular: Normal rate and regular rhythm.   Pulmonary/Chest: Effort normal and breath sounds normal.  Neurological: Pt is alert. Not confused  Skin: Skin is warm. No erythema.  Psychiatric: Pt behavior is normal. Thought content normal. not depressed appearing, mild nervous    Assessment & Plan:

## 2012-06-09 NOTE — Patient Instructions (Addendum)
Please take all new medication as prescribed - the patanol for the eyes (or OTC Zaditor) Please remember to followup with your GYN for the yearly pap smear and/or mammogram - to consider [facility] or [facility] on [place] for mammogram Please continue all other medications as before, and refills have been done if requested. Please have the pharmacy call with any other refills you may need. Thank you for enrolling in MyChart. Please follow the instructions below to securely access your online medical record. MyChart allows you to send messages to your doctor, view your test results, renew your prescriptions, schedule appointments, and more To Log into My Chart online, please go by [facility] or [employer] to [email or web address], or download the MyChart App from the [employer] .  Your Username is: [email or web address] Please return in 6 months, or sooner if needed, with Lab testing done 3-5 days before

## 2012-06-10 ENCOUNTER — Encounter: Payer: Self-pay | Admitting: Internal Medicine

## 2012-06-10 DIAGNOSIS — H1010 Acute atopic conjunctivitis, unspecified eye: Secondary | ICD-10-CM | POA: Insufficient documentation

## 2012-06-10 NOTE — Assessment & Plan Note (Signed)
stable overall by history and exam, recent data reviewed with pt, and pt to continue medical treatment as before,  to f/u any worsening symptoms or concerns Lab Results  Component Value Date   WBC 7.1 11/18/2011   HGB 13.6 11/18/2011   HCT 40.5 11/18/2011   PLT 154.0 11/18/2011   GLUCOSE 92 11/18/2011   CHOL 148 11/18/2011   TRIG 274.0* 11/18/2011   HDL 44.00 11/18/2011   LDLDIRECT 80.1 11/18/2011   ALT 36* 11/18/2011   AST 25 11/18/2011   NA 139 11/18/2011   K 5.2* 11/18/2011   CL 101 11/18/2011   CREATININE 0.7 11/18/2011   BUN 15 11/18/2011   CO2 30 11/18/2011   TSH 1.71 11/18/2011

## 2012-06-10 NOTE — Assessment & Plan Note (Signed)
For patanol asd,  to f/u any worsening symptoms or concerns

## 2012-06-10 NOTE — Assessment & Plan Note (Signed)
stable overall by history and exam, recent data reviewed with pt, and pt to continue medical treatment as before,  to f/u any worsening symptoms or concerns Lab Results  Component Value Date   CHOL 148 11/18/2011   HDL 44.00 11/18/2011   LDLDIRECT 80.1 11/18/2011   TRIG 274.0* 11/18/2011   CHOLHDL 3 11/18/2011

## 2012-06-16 ENCOUNTER — Ambulatory Visit: Payer: Medicare Other | Admitting: Internal Medicine

## 2012-06-21 ENCOUNTER — Other Ambulatory Visit: Payer: Self-pay | Admitting: Oncology

## 2012-06-22 ENCOUNTER — Other Ambulatory Visit: Payer: Self-pay | Admitting: Hematology and Oncology

## 2012-06-22 ENCOUNTER — Encounter (HOSPITAL_COMMUNITY): Payer: Self-pay

## 2012-06-22 ENCOUNTER — Ambulatory Visit (HOSPITAL_COMMUNITY)
Admission: RE | Admit: 2012-06-22 | Discharge: 2012-06-22 | Disposition: A | Discharge status: Home / Self Care | Payer: Medicare Other | Source: Ambulatory Visit | Attending: Hematology and Oncology | Admitting: Hematology and Oncology

## 2012-06-22 ENCOUNTER — Ambulatory Visit (HOSPITAL_COMMUNITY): Payer: Medicare Other

## 2012-06-22 ENCOUNTER — Other Ambulatory Visit (HOSPITAL_BASED_OUTPATIENT_CLINIC_OR_DEPARTMENT_OTHER): Payer: Medicare Other | Admitting: Lab

## 2012-06-22 DIAGNOSIS — C8589 Other specified types of non-Hodgkin lymphoma, extranodal and solid organ sites: Secondary | ICD-10-CM | POA: Insufficient documentation

## 2012-06-22 DIAGNOSIS — Z9221 Personal history of antineoplastic chemotherapy: Secondary | ICD-10-CM | POA: Insufficient documentation

## 2012-06-22 DIAGNOSIS — C8590 Non-Hodgkin lymphoma, unspecified, unspecified site: Secondary | ICD-10-CM

## 2012-06-22 DIAGNOSIS — C8291 Follicular lymphoma, unspecified, lymph nodes of head, face, and neck: Secondary | ICD-10-CM

## 2012-06-22 DIAGNOSIS — Z90710 Acquired absence of both cervix and uterus: Secondary | ICD-10-CM | POA: Insufficient documentation

## 2012-06-22 LAB — COMPREHENSIVE METABOLIC PANEL (CC13)
AST: 20 U/L (ref 5–34)
Albumin: 3.9 g/dL (ref 3.5–5.0)
Alkaline Phosphatase: 91 U/L (ref 40–150)
BUN: 22.2 mg/dL (ref 7.0–26.0)
Calcium: 10.2 mg/dL (ref 8.4–10.4)
Chloride: 103 mEq/L (ref 98–107)
Glucose: 92 mg/dl (ref 70–99)
Potassium: 4 mEq/L (ref 3.5–5.1)
Sodium: 141 mEq/L (ref 136–145)
Total Protein: 6.5 g/dL (ref 6.4–8.3)

## 2012-06-22 LAB — CBC WITH DIFFERENTIAL/PLATELET
EOS%: 5 % (ref 0.0–7.0)
Eosinophils Absolute: 0.3 10*3/uL (ref 0.0–0.5)
LYMPH%: 25.9 % (ref 14.0–49.7)
MCH: 30.7 pg (ref 25.1–34.0)
MCHC: 34.9 g/dL (ref 31.5–36.0)
MCV: 87.9 fL (ref 79.5–101.0)
MONO%: 8.6 % (ref 0.0–14.0)
NEUT#: 4 10*3/uL (ref 1.5–6.5)
Platelets: 167 10*3/uL (ref 145–400)
RBC: 4.37 10*6/uL (ref 3.70–5.45)
RDW: 14.8 % — ABNORMAL HIGH (ref 11.2–14.5)

## 2012-06-22 MED ORDER — IOHEXOL 300 MG/ML  SOLN
100.0000 mL | Freq: Once | INTRAMUSCULAR | Status: AC | PRN
  Administered 2012-06-22: 100 mL via INTRAVENOUS

## 2012-06-23 ENCOUNTER — Ambulatory Visit (HOSPITAL_BASED_OUTPATIENT_CLINIC_OR_DEPARTMENT_OTHER): Payer: Medicare Other | Admitting: Oncology

## 2012-06-23 ENCOUNTER — Telehealth: Payer: Self-pay | Admitting: Oncology

## 2012-06-23 ENCOUNTER — Encounter: Payer: Self-pay | Admitting: Oncology

## 2012-06-23 VITALS — BP 159/91 | HR 83 | Temp 98.1°F | Resp 18 | Ht 61.0 in | Wt 162.9 lb

## 2012-06-23 DIAGNOSIS — C8589 Other specified types of non-Hodgkin lymphoma, extranodal and solid organ sites: Secondary | ICD-10-CM

## 2012-06-23 DIAGNOSIS — C8590 Non-Hodgkin lymphoma, unspecified, unspecified site: Secondary | ICD-10-CM

## 2012-06-23 NOTE — Telephone Encounter (Signed)
appts made and pritned for pt aom °

## 2012-06-23 NOTE — Progress Notes (Signed)
CC:   Corwin Levins, MD Azzie Roup, MD  PROBLEM LIST: 1. Non-Hodgkin's lymphoma, follicular cell origin, grade 1, CD10 and     CD20 positive presenting as a right submandibular mass in 2005 when     the patient was living in [place], [place].  PET scan showed stage     III disease.  The patient received chemotherapy with Rituxan,     fludarabine, mitoxantrone and Decadron for 8 cycles from September     2005 through March 2006.  She then received maintenance Rituxan for     2 years and has been disease free since that time.  PET scan     carried out in [place] on 05/02/2011 showed no evidence of disease.     CT scans from 06/22/2012 involving neck, chest, abdomen and pelvis     also showed no evidence of disease.  The patient is not on any     therapy. 2. Autoimmune disease treated with methotrexate under the direction of     Dr. Azzie Roup.  The patient states that she has had what     sounds like angioedema. 3. Dyslipidemia. 4. History of atrial fibrillation. 5. History of duodenal ulcers in the distant past. 6. History of uterine fibroids status post total abdominal     hysterectomy. 7. Generalized arthritis.  MEDICATIONS:  Reviewed and recorded. Current Outpatient Prescriptions  Medication Sig Dispense Refill  . ALPRAZolam (XANAX) 0.5 MG tablet Take 1 tablet (0.5 mg total) by mouth at bedtime as needed.  30 tablet  2  . aspirin 81 MG EC tablet Take 1 tablet (81 mg total) by mouth daily. Swallow whole.  30 tablet  12  . cyclobenzaprine (FLEXERIL) 5 MG tablet Take 1 tablet (5 mg total) by mouth 3 (three) times daily as needed for muscle spasms.  60 tablet  1  . famotidine (PEPCID) 20 MG tablet Take 1 tablet (20 mg total) by mouth 2 (two) times daily.  90 tablet  3  . methotrexate (RHEUMATREX) 2.5 MG tablet Take 7.5 mg by mouth once a week.      Marland Kitchen PARoxetine (PAXIL) 20 MG tablet Take 1 tablet (20 mg total) by mouth every morning.  90 tablet  3  . zolpidem (AMBIEN) 10 MG tablet  Take 1 tablet (10 mg total) by mouth at bedtime as needed.  30 tablet  5    SMOKING HISTORY:  The patient is a former cigarette smoker.   HISTORY:  Belinda Mcdowell is a 72 year old female who relocated to [place] from [place], [place], in June 2013 to be near her son.  The patient lives in a 1 level apartment.  She drives and is independent. She is currently doing well.  She denies any symptoms that might be related to recurrence of lymphoma.  Appetite is good.  Energy is also good.  No fever.  She has had night sweats for many years.  She tells me that for 10 years she had a right submandibular mass that ultimately was excised and turned out to be non-Hodgkin's lymphoma.  She apparently had a bone marrow that was negative and PET scan showed stage III disease. Treatment as described above.  The patient remains without evidence of disease following treatment, now about 8 years following her induction therapy and about 6 years following completion of maintenance Rituxan. She is without any complaints today.   PHYSICAL EXAMINATION:  She looks well.  Weight is 162.8 pounds.  Height 5 feet 1 inch.  Body surface area 1.78 sq m.  Blood pressure 159/91. Other vital signs are normal.  There is no scleral icterus.  Mouth and pharynx are benign.  There is no peripheral adenopathy palpable in the neck, supraclavicular, axillary or inguinal areas.  She has a rather inapparent scar in her right submandibular area.  Heart and lungs were normal.  No murmur or rub.  Abdomen is benign with no organomegaly or masses palpable.  Extremities, puffy ankles without pitting edema.  No clubbing.  She has lot of cherry angioma over her upper abdomen.  She has some arthritic changes in her fingers.  Neurologic exam is nonfocal.  LABORATORY DATA:  Today, white count 6.8, ANC 4.0, hemoglobin 13.4, hematocrit 38.5, platelets 167,000.  Chemistries today were normal including an LDH of 202 and an albumin of  3.9.  IMAGING STUDIES:  CT scan of neck, chest, abdomen and pelvis with IV contrast carried out on 06/22/2012 showed no evidence for lymphoma recurrence.  There were degenerative changes involving the spine.  IMPRESSION AND PLAN:  The patient appears to be in remission regarding her non-Hodgkin's lymphoma.  It is interesting that the patient relates what sounds like angioedema which may be associated with lymphomas.  At this point followup should be rather conservative.  I would tend to minimize scanning especially if the patient is asymptomatic and doing well.  We will plan to see Belinda Mcdowell again in 6 months at which time we will check CBC and chemistries.  Previous records were reviewed for this first time appointment with me.    ______________________________ Samul Dada, M.D. DSM/MEDQ  D:  06/23/2012  T:  06/23/2012  Job:  [id number]

## 2012-06-23 NOTE — Progress Notes (Signed)
This office note has been dictated.  #[id number]

## 2012-06-24 ENCOUNTER — Ambulatory Visit: Payer: Medicare Other | Admitting: Family

## 2012-06-24 ENCOUNTER — Ambulatory Visit: Payer: Medicare Other | Admitting: Hematology and Oncology

## 2012-06-30 ENCOUNTER — Encounter: Payer: Self-pay | Admitting: Internal Medicine

## 2012-06-30 ENCOUNTER — Ambulatory Visit (INDEPENDENT_AMBULATORY_CARE_PROVIDER_SITE_OTHER): Payer: Medicare Other | Admitting: Internal Medicine

## 2012-06-30 VITALS — BP 150/89 | HR 77 | Ht 62.0 in | Wt 161.4 lb

## 2012-06-30 DIAGNOSIS — R002 Palpitations: Secondary | ICD-10-CM

## 2012-06-30 DIAGNOSIS — B999 Unspecified infectious disease: Secondary | ICD-10-CM

## 2012-06-30 DIAGNOSIS — L089 Local infection of the skin and subcutaneous tissue, unspecified: Secondary | ICD-10-CM

## 2012-06-30 DIAGNOSIS — E782 Mixed hyperlipidemia: Secondary | ICD-10-CM

## 2012-06-30 LAB — LIPID PANEL
HDL: 51.9 mg/dL (ref >39.00)
Triglycerides: 261 mg/dL — ABNORMAL HIGH (ref 0.0–149.0)
VLDL: 52.2 mg/dL — ABNORMAL HIGH (ref 0.0–40.0)

## 2012-06-30 MED ORDER — CEPHALEXIN 500 MG PO CAPS: ORAL_CAPSULE | ORAL | Status: DC

## 2012-06-30 MED ORDER — CEPHALEXIN 250 MG PO CAPS: ORAL_CAPSULE | ORAL | Status: DC

## 2012-06-30 NOTE — Assessment & Plan Note (Signed)
stable °

## 2012-06-30 NOTE — Progress Notes (Signed)
Patient Care Team: Corwin Levins, MD as PCP - General (Internal Medicine)   HPI  Belinda Mcdowell is a 72 y.o. female Seen in followup for a questionable diagnosis of atrial fibrillation which she was having symptoms of almost daily. Holter data clarified these as  PVCs.  She has a history of chemotherapy for lymphoma.   Echocardiogram demonstrated normal left ventricular function; mild left atrial enlargement (September 2013)   She had complaints of exercise intolerance and notable sinus bradycardia. In October we discontinued her beta blockers and her Lanoxin.   Her energy level is not better. It has been like this for many years. We discussed the potential contribution of other medications which she takes for her anxiety, depression.  We also stopped her status. Repeat lipid testing is pending    Past Medical History  Diagnosis Date  . Generalized headaches   . UTI (lower urinary tract infection)   . Allergic rhinitis, cause unspecified   . Arthritis   . Depression   . Insomnia   . Hyperlipidemia   . Anxiety   . Non-Hodgkin lymphoma 11/18/2011    Stage 3  . Atrial fibrillation     by history; the third suggests PVCs  . Fibromyalgia 11/18/2011  . Mucous retention cyst of maxillary sinus 11/18/2011    Right   . Psoriasis 11/18/2011    On MTX  . Anemia, unspecified 11/18/2011    Prior on aranesp  . PUD (peptic ulcer disease) 11/18/2011    Duodenal at age 68 and 66  . Degenerative joint disease 11/18/2011    diffuse  . Lumbar disc disease 11/18/2011    Past Surgical History  Procedure Date  . Appendectomy   . Abdominal hysterectomy     for dysfunctional bleeding    Current Outpatient Prescriptions  Medication Sig Dispense Refill  . ALPRAZolam (XANAX) 0.5 MG tablet Take 1 tablet (0.5 mg total) by mouth at bedtime as needed.  30 tablet  2  . aspirin 81 MG EC tablet Take 1 tablet (81 mg total) by mouth daily. Swallow whole.  30 tablet  12  . cyclobenzaprine (FLEXERIL) 5 MG  tablet Take 1 tablet (5 mg total) by mouth 3 (three) times daily as needed for muscle spasms.  60 tablet  1  . famotidine (PEPCID) 20 MG tablet Take 1 tablet (20 mg total) by mouth 2 (two) times daily.  90 tablet  3  . methotrexate (RHEUMATREX) 2.5 MG tablet Take 7.5 mg by mouth once a week.      Marland Kitchen PARoxetine (PAXIL) 20 MG tablet Take 1 tablet (20 mg total) by mouth every morning.  90 tablet  3  . zolpidem (AMBIEN) 10 MG tablet Take 1 tablet (10 mg total) by mouth at bedtime as needed.  30 tablet  5    No Known Allergies  Review of Systems negative except from HPI and PMH  Physical Exam BP 150/89  Pulse 77  Ht 5\' 2"  (1.575 m)  Wt 161 lb 6.4 oz (73.211 kg)  BMI 29.52 kg/m2  Well developed and well nourished in no acute distress HENT normal E scleral and icterus clear Neck Supple JVP flat; carotids brisk and full Clear to ausculation  Regular rate and rhythm, no murmurs gallops or rub Soft with active bowel sounds No clubbing cyanosis none Edema there is erythema around the distal third and fourth fingers of her right hand. There is tenderness. Alert and oriented, grossly normal motor and sensory function Skin Warm and Dry  Assessment and  Plan

## 2012-06-30 NOTE — Assessment & Plan Note (Signed)
We will recheck her lipid status off of her statins.

## 2012-06-30 NOTE — Patient Instructions (Signed)
Your physician recommends that you have lab work today: lipid panel/ direct LDL  Your physician has recommended you make the following change in your medication:  1) Start Keflex 250 mg one capsule by mouth every 8 hours for 7 days.  Your physician wants you to follow-up in: 6 months with Dr. Graciela Husbands. You will receive a reminder letter in the mail two months in advance. If you don't receive a letter, please call our office to schedule the follow-up appointment.

## 2012-06-30 NOTE — Assessment & Plan Note (Signed)
She has soft tissue infections of the tips of her fingers. I wonder whether is related to cutting her nails. Keflex has worked in the past. We will prescribe that. I advised her that if she does not see improvement in the next 48 hours she'll need to get up and this with Dr. Jonny Ruiz. I've told her that there is potential seriousness of soft tissue infections in the hands

## 2012-07-01 ENCOUNTER — Telehealth: Payer: Self-pay | Admitting: Internal Medicine

## 2012-07-01 ENCOUNTER — Ambulatory Visit (INDEPENDENT_AMBULATORY_CARE_PROVIDER_SITE_OTHER): Payer: Medicare Other | Admitting: Internal Medicine

## 2012-07-01 ENCOUNTER — Encounter: Payer: Self-pay | Admitting: Internal Medicine

## 2012-07-01 VITALS — BP 142/92 | HR 82 | Temp 98.9°F | Wt 162.1 lb

## 2012-07-01 DIAGNOSIS — B999 Unspecified infectious disease: Secondary | ICD-10-CM

## 2012-07-01 DIAGNOSIS — L089 Local infection of the skin and subcutaneous tissue, unspecified: Secondary | ICD-10-CM

## 2012-07-01 DIAGNOSIS — R030 Elevated blood-pressure reading, without diagnosis of hypertension: Secondary | ICD-10-CM

## 2012-07-01 DIAGNOSIS — F419 Anxiety disorder, unspecified: Secondary | ICD-10-CM

## 2012-07-01 DIAGNOSIS — F411 Generalized anxiety disorder: Secondary | ICD-10-CM

## 2012-07-01 MED ORDER — DOXYCYCLINE HYCLATE 100 MG PO TABS: 100.0000 mg | ORAL_TABLET | Freq: Two times a day (BID) | ORAL | Status: DC

## 2012-07-01 NOTE — Progress Notes (Signed)
  Subjective:    Patient ID: Belinda Mcdowell, female    DOB: 01-May-1941, 72 y.o.   MRN: [id number]  HPI   Here after seeing Dr Graciela Husbands yesterday, with incidental c/o then and today of 4th finger right hand pain/red/swelling starting x 3 days, started tx with cephalexin yesterday but seemed worse overnight, and driained some so little fluctuant today but pain/red is increased.  No high fevers but feels warm.  Denies worsening depressive symptoms, suicidal ideation, or panic; has ongoing anxiety, some increased situationally.    Past Medical History  Diagnosis Date  . Generalized headaches   . UTI (lower urinary tract infection)   . Allergic rhinitis, cause unspecified   . Arthritis   . Depression   . Insomnia   . Hyperlipidemia   . Anxiety   . Non-Hodgkin lymphoma 11/18/2011    Stage 3  . Atrial fibrillation     by history; the third suggests PVCs  . Fibromyalgia 11/18/2011  . Mucous retention cyst of maxillary sinus 11/18/2011    Right   . Psoriasis 11/18/2011    On MTX  . Anemia, unspecified 11/18/2011    Prior on aranesp  . PUD (peptic ulcer disease) 11/18/2011    Duodenal at age 42 and 26  . Degenerative joint disease 11/18/2011    diffuse  . Lumbar disc disease 11/18/2011   Past Surgical History  Procedure Date  . Appendectomy   . Abdominal hysterectomy     for dysfunctional bleeding    reports that she has quit smoking. Her smoking use included Cigarettes. She has a 3.75 pack-year smoking history. She has never used smokeless tobacco. She reports that she does not drink alcohol or use illicit drugs. family history includes Arthritis in her other. No Known Allergies Review of Systems All otherwise neg per pt     Objective:   Physical Exam BP 142/92  Pulse 82  Temp 98.9 F (37.2 C) (Oral)  Wt 162 lb 2 oz (73.539 kg)  SpO2 96% VS noted, not ill appearing Constitutional: Pt appears well-developed and well-nourished.  HENT: Head: NCAT.  Right Ear: External ear normal.   Left Ear: External ear normal.  Eyes: Conjunctivae and EOM are normal. Pupils are equal, round, and reactive to light.  Neck: Normal range of motion. Neck supple.  Cardiovascular: Normal rate and regular rhythm.   Pulmonary/Chest: Effort normal and breath sounds normal.  Neurological: Pt is alert. Not confused  Skin: right ring finger with medial nail trace fluctuance and tender/red/swelling to most of finger distal to the DIP o/w neurovasc intact, no red streaks Psychiatric: Pt behavior is normal. Thought content normal. Mild nervous, not depressed affect    Assessment & Plan:

## 2012-07-01 NOTE — Telephone Encounter (Signed)
New problem:    Would like a call back today .    Seen in the office on yesterday  Was given an antibiotic Dosage was  250 mg . Patient states she needs  500 mg antibiotic .

## 2012-07-01 NOTE — Patient Instructions (Addendum)
Please take all new medication as prescribed - the doxycycline OK to stop the cephalexin Please continue all other medications as before, and refills have been done if requested. Thank you for enrolling in MyChart. Please follow the instructions below to securely access your online medical record. MyChart allows you to send messages to your doctor, view your test results, renew your prescriptions, schedule appointments, and more.

## 2012-07-01 NOTE — Telephone Encounter (Signed)
Patient Information:  Caller Name: Darrel Reach  Phone: [phone]  Patient: Belinda Mcdowell, Belinda Mcdowell  Gender: Female  DOB: Nov 09, 1940  Age: 72 Years  PCP: Oliver Barre (Adults only)  Office Follow Up:  Does the office need to follow up with this patient?: No  Instructions For The Office: N/A   Symptoms  Reason For Call & Symptoms: Patient had an appointment with her cardiologist yesterday, 06/30/12. She was given antibiotics for an infection on one of her fingers. She feels the medication is not helping and she thinks it is getting worse. She tried calling the cardiologist to have the medication adjusted, but they will not do anything today because the doctor who prescribed this medication is not in the office today. She was told to call her PCP about this.  Reviewed Health History In EMR: Yes  Reviewed Medications In EMR: Yes  Reviewed Allergies In EMR: Yes  Reviewed Surgeries / Procedures: Yes  Date of Onset of Symptoms: 07/01/2012  Treatments Tried: Keflex  Treatments Tried Worked: No  Guideline(s) Used:  No Protocol Available - Sick Adult  Disposition Per Guideline:   See Today in Office  Reason For Disposition Reached:   Nursing judgment  Advice Given:  N/A  Appointment Scheduled:  07/01/2012 14:30:00 Appointment Scheduled Provider:  Oliver Barre (Adults only)

## 2012-07-01 NOTE — Telephone Encounter (Signed)
I talked with Belinda Mcdowell who does not agree with the doseage of keflex as prescribed yesterday by Dr. Graciela Husbands.  After talking with pharmacist doseage of 250mg  was appropriate.  Belinda Mcdowell states her pcp in [place], [place] "always prescribed 500mg  because that is what worked for me! A child takes 250mg  not someone my age or size!!"  Discussed with Belinda Mcdowell that she could see her pcp about changing the dose as Dr. Graciela Husbands is out of the office at this time. Also that he was being helpful yesterday in that this was regarding a skin infection of her fingers and not a cardiology issue. She was more receptive to this fact that Dr. Graciela Husbands was being helpful.  Belinda Mcdowell understands that I cannot change the dose of her keflex for her. I will forward this to Dr. Graciela Husbands for review. Mylo Red RN

## 2012-07-02 ENCOUNTER — Encounter: Payer: Self-pay | Admitting: Internal Medicine

## 2012-07-02 DIAGNOSIS — R030 Elevated blood-pressure reading, without diagnosis of hypertension: Secondary | ICD-10-CM | POA: Insufficient documentation

## 2012-07-02 NOTE — Assessment & Plan Note (Signed)
Mild elev, likely situational,  to f/u any worsening symptoms or concerns

## 2012-07-02 NOTE — Assessment & Plan Note (Signed)
With paronychia , to change to doxy course, d/c cephalexin,  to f/u any worsening symptoms or concerns

## 2012-07-02 NOTE — Assessment & Plan Note (Signed)
Overall stable overall by history and exam, recent data reviewed with pt, and pt to continue medical treatment as before,  to f/u any worsening symptoms or concerns Lab Results  Component Value Date   WBC 6.8 06/22/2012   HGB 13.4 06/22/2012   HCT 38.5 06/22/2012   PLT 167 06/22/2012   GLUCOSE 92 06/22/2012   CHOL 243* 06/30/2012   TRIG 261.0* 06/30/2012   HDL 51.90 06/30/2012   LDLDIRECT 148.7 06/30/2012   ALT 24 06/22/2012   AST 20 06/22/2012   NA 141 06/22/2012   K 4.0 06/22/2012   CL 103 06/22/2012   CREATININE 0.8 06/22/2012   BUN 22.2 06/22/2012   CO2 27 06/22/2012   TSH 1.71 11/18/2011

## 2012-07-12 ENCOUNTER — Other Ambulatory Visit: Payer: Self-pay

## 2012-08-26 ENCOUNTER — Other Ambulatory Visit: Payer: Self-pay

## 2012-08-26 MED ORDER — ALPRAZOLAM 0.5 MG PO TABS: 0.5000 mg | ORAL_TABLET | Freq: Every evening | ORAL | Status: DC | PRN

## 2012-08-26 NOTE — Telephone Encounter (Signed)
Done hardcopy to robin  

## 2012-08-27 NOTE — Telephone Encounter (Signed)
Faxed hardcopy to [facility]. 

## 2012-10-06 ENCOUNTER — Telehealth: Payer: Self-pay

## 2012-10-06 MED ORDER — TIZANIDINE HCL 4 MG PO TABS: 4.0000 mg | ORAL_TABLET | Freq: Three times a day (TID) | ORAL | Status: DC | PRN

## 2012-10-06 NOTE — Telephone Encounter (Signed)
PA was completed and denied please advise on alternative

## 2012-10-06 NOTE — Telephone Encounter (Signed)
Ok to try change to zanaflex - done erx

## 2012-10-06 NOTE — Telephone Encounter (Signed)
Pt called stating she was advised by her insurance company that a PA is now needed for cyclobenzaprine. Please advise pt directly if paperwork was received/status of request.

## 2012-10-07 NOTE — Telephone Encounter (Signed)
Called left msg to call back. 

## 2012-10-07 NOTE — Telephone Encounter (Signed)
Patient informed. 

## 2012-10-13 ENCOUNTER — Telehealth: Payer: Self-pay | Admitting: Internal Medicine

## 2012-10-13 MED ORDER — TIZANIDINE HCL 4 MG PO TABS: 4.0000 mg | ORAL_TABLET | Freq: Three times a day (TID) | ORAL | Status: DC | PRN

## 2012-10-13 NOTE — Telephone Encounter (Signed)
Called the patient and sent new muscle relaxer tizanidine to [facility] and not local

## 2012-10-13 NOTE — Telephone Encounter (Signed)
Patient is requesting that her new medication be sent to [facility] now, she states that she has been to the pharmacy 3 times and they still do not have the medication, would like a call back when this has been completed

## 2012-10-20 ENCOUNTER — Other Ambulatory Visit: Payer: Self-pay

## 2012-10-20 MED ORDER — PAROXETINE HCL 20 MG PO TABS: 20.0000 mg | ORAL_TABLET | ORAL | Status: DC

## 2012-10-20 NOTE — Telephone Encounter (Signed)
Done erx 

## 2012-10-27 ENCOUNTER — Other Ambulatory Visit: Payer: Self-pay

## 2012-10-27 MED ORDER — ZOLPIDEM TARTRATE 10 MG PO TABS: 10.0000 mg | ORAL_TABLET | Freq: Every evening | ORAL | Status: DC | PRN

## 2012-10-27 NOTE — Telephone Encounter (Signed)
Done hardcopy to robin  

## 2012-10-27 NOTE — Telephone Encounter (Signed)
Faxed hardcopy to [facility]. 

## 2012-11-03 ENCOUNTER — Telehealth: Payer: Self-pay | Admitting: *Deleted

## 2012-11-03 MED ORDER — ALPRAZOLAM 0.5 MG PO TABS: 0.5000 mg | ORAL_TABLET | Freq: Two times a day (BID) | ORAL | Status: DC | PRN

## 2012-11-03 NOTE — Telephone Encounter (Signed)
Called the patient left message to call back 

## 2012-11-03 NOTE — Telephone Encounter (Signed)
Done hardcopy to robin  

## 2012-11-03 NOTE — Telephone Encounter (Signed)
Called left message to call back 

## 2012-11-03 NOTE — Telephone Encounter (Signed)
Left msg on triage stating her alprazolam instructions was change. She use to take 1 twice a day. Only been getting # 30. Pt is requesting rx to be change back to twice a day with # 60...Belinda Mcdowell

## 2012-11-04 NOTE — Telephone Encounter (Signed)
Called left message to call back with pharmacy to fax rx to.

## 2012-11-05 NOTE — Telephone Encounter (Signed)
Received patient return call to fax hardcopy to [facility]

## 2012-12-02 ENCOUNTER — Telehealth: Payer: Self-pay | Admitting: Hematology and Oncology

## 2012-12-02 NOTE — Telephone Encounter (Signed)
lmonvm advising the pt of her appts on 12/22/2012@12 :45pm

## 2012-12-04 ENCOUNTER — Telehealth: Payer: Self-pay | Admitting: Oncology

## 2012-12-04 NOTE — Telephone Encounter (Signed)
pt called to r/s appt due to insurance and copay.Marland KitchenMarland KitchenMarland KitchenDone

## 2012-12-08 ENCOUNTER — Ambulatory Visit (INDEPENDENT_AMBULATORY_CARE_PROVIDER_SITE_OTHER): Payer: Medicare Other | Admitting: Internal Medicine

## 2012-12-08 ENCOUNTER — Encounter: Payer: Self-pay | Admitting: Internal Medicine

## 2012-12-08 VITALS — BP 152/88 | HR 43 | Temp 98.5°F | Ht 62.0 in | Wt 165.2 lb

## 2012-12-08 DIAGNOSIS — R030 Elevated blood-pressure reading, without diagnosis of hypertension: Secondary | ICD-10-CM

## 2012-12-08 DIAGNOSIS — Z0000 Encounter for general adult medical examination without abnormal findings: Secondary | ICD-10-CM | POA: Insufficient documentation

## 2012-12-08 MED ORDER — ATORVASTATIN CALCIUM 20 MG PO TABS: 20.0000 mg | ORAL_TABLET | Freq: Every day | ORAL | Status: DC

## 2012-12-08 NOTE — Progress Notes (Signed)
Subjective:    Patient ID: Belinda Mcdowell, female    DOB: [birth year], 72 y.o.   MRN: [id number]  HPI    Here for wellness and f/u;  Overall doing ok;  Pt denies CP, worsening SOB, DOE, wheezing, orthopnea, PND, worsening LE edema, palpitations, dizziness or syncope.  Pt denies neurological change such as new headache, facial or extremity weakness.  Pt denies polydipsia, polyuria, or low sugar symptoms. Pt states overall good compliance with treatment and medications, good tolerability, and has been trying to follow lower cholesterol diet.  Pt denies worsening depressive symptoms, suicidal ideation or panic. No fever, night sweats, wt loss, loss of appetite, or other constitutional symptoms.  Pt states good ability with ADL's, has low fall risk, home safety reviewed and adequate, no other significant changes in hearing or vision, and only occasionally active with exercise.  No acute commplaints.  Asks to re-start the lipitor which has been stopped per card oct 2013 as not indicated for secondary prevention, but pt is asking for primary prevention and re-start med. Bp at home has been < 140/90 at home Past Medical History  Diagnosis Date  . Generalized headaches   . UTI (lower urinary tract infection)   . Allergic rhinitis, cause unspecified   . Arthritis   . Depression   . Insomnia   . Hyperlipidemia   . Anxiety   . Non-Hodgkin lymphoma 11/18/2011    Stage 3  . Atrial fibrillation     by history; the third suggests PVCs  . Fibromyalgia 11/18/2011  . Mucous retention cyst of maxillary sinus 11/18/2011    Right   . Psoriasis 11/18/2011    On MTX  . Anemia, unspecified 11/18/2011    Prior on aranesp  . PUD (peptic ulcer disease) 11/18/2011    Duodenal at age 67 and 50  . Degenerative joint disease 11/18/2011    diffuse  . Lumbar disc disease 11/18/2011   Past Surgical History  Procedure Laterality Date  . Appendectomy    . Abdominal hysterectomy      for dysfunctional bleeding    reports  that she has quit smoking. Her smoking use included Cigarettes. She has a 3.75 pack-year smoking history. She has never used smokeless tobacco. She reports that she does not drink alcohol or use illicit drugs. family history includes Arthritis in her other. No Known Allergies Current Outpatient Prescriptions on File Prior to Visit  Medication Sig Dispense Refill  . ALPRAZolam (XANAX) 0.5 MG tablet Take 1 tablet (0.5 mg total) by mouth 2 (two) times daily as needed for anxiety.  60 tablet  2  . famotidine (PEPCID) 20 MG tablet Take 1 tablet (20 mg total) by mouth 2 (two) times daily.  90 tablet  3  . methotrexate (RHEUMATREX) 2.5 MG tablet Take 7.5 mg by mouth once a week.      Marland Kitchen PARoxetine (PAXIL) 20 MG tablet Take 1 tablet (20 mg total) by mouth every morning.  90 tablet  3  . tiZANidine (ZANAFLEX) 4 MG tablet Take 1 tablet (4 mg total) by mouth every 8 (eight) hours as needed.  90 tablet  3  . zolpidem (AMBIEN) 10 MG tablet Take 1 tablet (10 mg total) by mouth at bedtime as needed.  30 tablet  5   No current facility-administered medications on file prior to visit.   Review of Systems Constitutional: Negative for diaphoresis, activity change, appetite change or unexpected weight change.  HENT: Negative for hearing loss, ear pain, facial swelling, mouth  sores and neck stiffness.   Eyes: Negative for pain, redness and visual disturbance.  Respiratory: Negative for shortness of breath and wheezing.   Cardiovascular: Negative for chest pain and palpitations.  Gastrointestinal: Negative for diarrhea, blood in stool, abdominal distention or other pain Genitourinary: Negative for hematuria, flank pain or change in urine volume.  Musculoskeletal: Negative for myalgias and joint swelling.  Skin: Negative for color change and wound.  Neurological: Negative for syncope and numbness. other than noted Hematological: Negative for adenopathy.  Psychiatric/Behavioral: Negative for hallucinations,  self-injury, decreased concentration and agitation.      Objective:   Physical Exam BP 152/88  Pulse 43  Temp(Src) 98.5 F (36.9 C) (Oral)  Ht 5\' 2"  (1.575 m)  Wt 165 lb 4 oz (74.957 kg)  BMI 30.22 kg/m2  SpO2 97% VS noted,  Constitutional: Pt is oriented to person, place, and time. Appears well-developed and well-nourished.  Head: Normocephalic and atraumatic.  Right Ear: External ear normal.  Left Ear: External ear normal.  Nose: Nose normal.  Mouth/Throat: Oropharynx is clear and moist.  Eyes: Conjunctivae and EOM are normal. Pupils are equal, round, and reactive to light.  Neck: Normal range of motion. Neck supple. No JVD present. No tracheal deviation present.  Cardiovascular: Normal rate, regular rhythm, normal heart sounds and intact distal pulses.   Pulmonary/Chest: Effort normal and breath sounds normal.  Abdominal: Soft. Bowel sounds are normal. There is no tenderness. No HSM  Musculoskeletal: Normal range of motion. Exhibits no edema.  Lymphadenopathy:  Has no cervical adenopathy.  Neurological: Pt is alert and oriented to person, place, and time. Pt has normal reflexes. No cranial nerve deficit.  Skin: Skin is warm and dry. No rash noted.  Psychiatric:  Has  normal mood and affect. Behavior is normal.     Assessment & Plan:

## 2012-12-08 NOTE — Patient Instructions (Addendum)
Please continue all other medications as before, and refills have been done if requested. Please have the pharmacy call with any other refills you may need. Please continue your efforts at being more active, low cholesterol diet, and weight control. You are otherwise up to date with prevention measures today. Please keep your appointments with your specialists as you have planned Please remember to followup with your GYN for the yearly pap smear and/or mammogram (consider [facility], and [facility] on [address])  Please remember to sign up for My Chart if you have not done so, as this will be important to you in the future with finding out test results, communicating by private email, and scheduling acute appointments online when needed.  Please return in 6 months, or sooner if needed

## 2012-12-08 NOTE — Assessment & Plan Note (Signed)

## 2012-12-08 NOTE — Assessment & Plan Note (Signed)
To cont monitor closely

## 2012-12-22 ENCOUNTER — Ambulatory Visit: Payer: Medicare Other

## 2012-12-22 ENCOUNTER — Other Ambulatory Visit: Payer: Medicare Other

## 2012-12-22 ENCOUNTER — Other Ambulatory Visit: Payer: Self-pay

## 2012-12-22 MED ORDER — FAMOTIDINE 20 MG PO TABS: 20.0000 mg | ORAL_TABLET | Freq: Two times a day (BID) | ORAL | Status: DC

## 2012-12-30 ENCOUNTER — Other Ambulatory Visit: Payer: Self-pay

## 2012-12-31 ENCOUNTER — Other Ambulatory Visit: Payer: Self-pay

## 2012-12-31 NOTE — Telephone Encounter (Signed)
Pharmacy informed.

## 2012-12-31 NOTE — Telephone Encounter (Signed)
Xanax too soon 

## 2013-01-26 ENCOUNTER — Ambulatory Visit (HOSPITAL_BASED_OUTPATIENT_CLINIC_OR_DEPARTMENT_OTHER): Payer: Medicare Other | Admitting: Internal Medicine

## 2013-01-26 ENCOUNTER — Other Ambulatory Visit (HOSPITAL_BASED_OUTPATIENT_CLINIC_OR_DEPARTMENT_OTHER): Payer: Medicare Other | Admitting: Lab

## 2013-01-26 ENCOUNTER — Telehealth: Payer: Self-pay | Admitting: Internal Medicine

## 2013-01-26 ENCOUNTER — Other Ambulatory Visit: Payer: Self-pay

## 2013-01-26 ENCOUNTER — Encounter: Payer: Self-pay | Admitting: Internal Medicine

## 2013-01-26 VITALS — BP 149/97 | HR 84 | Temp 99.0°F | Resp 18 | Ht 62.0 in | Wt 168.1 lb

## 2013-01-26 DIAGNOSIS — C8590 Non-Hodgkin lymphoma, unspecified, unspecified site: Secondary | ICD-10-CM

## 2013-01-26 DIAGNOSIS — C8589 Other specified types of non-Hodgkin lymphoma, extranodal and solid organ sites: Secondary | ICD-10-CM

## 2013-01-26 LAB — CBC WITH DIFFERENTIAL/PLATELET
BASO%: 1.6 % (ref 0.0–2.0)
EOS%: 5.1 % (ref 0.0–7.0)
HCT: 37.9 % (ref 34.8–46.6)
LYMPH%: 18.7 % (ref 14.0–49.7)
MCH: 30.5 pg (ref 25.1–34.0)
MCHC: 35 g/dL (ref 31.5–36.0)
MCV: 87.2 fL (ref 79.5–101.0)
MONO%: 10.3 % (ref 0.0–14.0)
NEUT%: 64.3 % (ref 38.4–76.8)
Platelets: 162 10*3/uL (ref 145–400)
RBC: 4.34 10*6/uL (ref 3.70–5.45)
WBC: 6.7 10*3/uL (ref 3.9–10.3)

## 2013-01-26 LAB — COMPREHENSIVE METABOLIC PANEL (CC13)
ALT: 27 U/L (ref 0–55)
AST: 22 U/L (ref 5–34)
Alkaline Phosphatase: 78 U/L (ref 40–150)
CO2: 28 mEq/L (ref 22–29)
Creatinine: 0.8 mg/dL (ref 0.6–1.1)
Sodium: 143 mEq/L (ref 136–145)
Total Bilirubin: 0.84 mg/dL (ref 0.20–1.20)
Total Protein: 6.2 g/dL — ABNORMAL LOW (ref 6.4–8.3)

## 2013-01-26 LAB — LACTATE DEHYDROGENASE (CC13): LDH: 204 U/L (ref 125–245)

## 2013-01-26 MED ORDER — ALPRAZOLAM 0.5 MG PO TABS: 0.5000 mg | ORAL_TABLET | Freq: Two times a day (BID) | ORAL | Status: DC | PRN

## 2013-01-26 NOTE — Progress Notes (Signed)
Hematology and Oncology Follow Up Visit  Belinda Mcdowell [id number] 12/21/1940 72 y.o. 01/26/2013 9:25 PM Oliver Barre, MD  Principle Diagnosis: Non-Hodgkin's Lymphoma  Current therapy: None  Oncology History   --2005.  Living in [place], [place]. Non-Hodgkin's lymphoma, follicular cell origin, grade 1, CD10 and CD20 positive presenting as a right submandibular mass. PET scan showed stage  III disease.    --September, 2005 - March 2006.  Received 8 cycles of Rituxan,fludarabine, mitoxantrone and Decadron.  --March, 2006 - March, 2008.  Received maintenance Rituxan x 2 years.    --05/02/2011.   PET scan c/w NED     --06/22/2012.  CT of neck, chest, abdomen and pelvis, c/w NED. The patient is not on any therapy.     Non-Hodgkin lymphoma   Interim History:  Today, Belinda Mcdowell reports feeling overall well since her last visit on 06/23/2012.   She is scheduled to see both her PCP and rheumatologist in November of 2014. She denies any symptoms that might be related to recurrence of lymphoma.  Her appetite is good.  Her Energy is also good.  She denies fever.  She has had night sweats for many years that remains stable.   She reports compliance to her medicines including her methotrexate.  She endorses occasional abdominal pain not related to position, bowel movement and not GERD-like. Her last colonoscopy was 3-4 year ago.  She continues to live by herself and handles her basic and intermediate ADLs without difficulty.  Her son and daughter lives in the [place] area and visits often.  She also reports back pain that is relieved partially with tylenol. She states that she is concerned about taking images because she still has to pay on her copayment.    Medications: I have reviewed the patient's current medications.   Current Outpatient Prescriptions  Medication Sig Dispense Refill  . atorvastatin (LIPITOR) 20 MG tablet Take 1 tablet (20 mg total) by mouth daily.  90 tablet  3  . famotidine  (PEPCID) 20 MG tablet Take 1 tablet (20 mg total) by mouth 2 (two) times daily.  180 tablet  3  . methotrexate (RHEUMATREX) 2.5 MG tablet Take 7.5 mg by mouth once a week.      Marland Kitchen PARoxetine (PAXIL) 20 MG tablet Take 1 tablet (20 mg total) by mouth every morning.  90 tablet  3  . tiZANidine (ZANAFLEX) 4 MG tablet Take 1 tablet (4 mg total) by mouth every 8 (eight) hours as needed.  90 tablet  3  . zolpidem (AMBIEN) 10 MG tablet Take 1 tablet (10 mg total) by mouth at bedtime as needed.  30 tablet  5  . ALPRAZolam (XANAX) 0.5 MG tablet Take 1 tablet (0.5 mg total) by mouth 2 (two) times daily as needed for anxiety. To fill sept 5, 2014  60 tablet  2   No current facility-administered medications for this visit.     Allergies: No Known Allergies  Past Medical History, Surgical history, Social history, and Family History were reviewed and updated.  Review of Systems: Constitutional:  Negative for fever, chills, night sweats, anorexia, weight loss, pain. Cardiovascular: no chest pain or dyspnea on exertion Respiratory: no cough, shortness of breath, or wheezing Neurological: no TIA or stroke symptoms Dermatological: negative for rashes ENT: negative Skin: Negative. Gastrointestinal: no abdominal pain, change in bowel habits, or black or bloody stools Genito-Urinary: no dysuria, trouble voiding, or hematuria Hematological and Lymphatic: negative Musculoskeletal: Endorses arthritis generalized Remaining ROS negative.  Physical Exam: Blood  pressure 149/97, pulse 84, temperature 99 F (37.2 C), temperature source Oral, resp. rate 18, height 5\' 2"  (1.575 m), weight 168 lb 1.6 oz (76.25 kg). ECOG: 1 General appearance: alert, cooperative, appears stated age and no distress Head: Normocephalic, without obvious abnormality, atraumatic Neck: no adenopathy, supple, symmetrical, trachea midline and thyroid not enlarged, symmetric, no tenderness/mass/nodules Lymph nodes: Cervical adenopathy: None  appreciated and Supraclavicular adenopathy: None appreciated Heart:irregularly irregular rhythm and S1, S2 normal Lung:chest clear, no wheezing, rales, normal symmetric air entry, Heart exam - S1, S2 normal, no murmur, no gallop, rate regular Abdomin: soft, non-tender, without masses or organomegaly EXT:No edema Neurological exam: No focal deficits. Normal gait.  Lab Results: Lab Results  Component Value Date   WBC 6.7 01/26/2013   HGB 13.3 01/26/2013   HCT 37.9 01/26/2013   MCV 87.2 01/26/2013   PLT 162 01/26/2013     Chemistry      Component Value Date/Time   NA 143 01/26/2013 1106   NA 139 11/18/2011 1533   K 4.0 01/26/2013 1106   K 5.2* 11/18/2011 1533   CL 103 06/22/2012 1045   CL 101 11/18/2011 1533   CO2 28 01/26/2013 1106   CO2 30 11/18/2011 1533   BUN 17.7 01/26/2013 1106   BUN 15 11/18/2011 1533   CREATININE 0.8 01/26/2013 1106   CREATININE 0.7 11/18/2011 1533      Component Value Date/Time   CALCIUM 9.8 01/26/2013 1106   CALCIUM 9.8 11/18/2011 1533   ALKPHOS 78 01/26/2013 1106   ALKPHOS 69 11/18/2011 1533   AST 22 01/26/2013 1106   AST 25 11/18/2011 1533   ALT 27 01/26/2013 1106   ALT 36* 11/18/2011 1533   BILITOT 0.84 01/26/2013 1106   BILITOT 0.7 11/18/2011 1533     Radiological Studies: IMAGING STUDIES:  CT scan of neck, chest, abdomen and pelvis with IV contrast carried out on 06/22/2012 showed no evidence for lymphoma recurrence.  There were degenerative changes involving the spine.  Impression and Plan: 1. Non-Hodgkin's lymphoma, now in remission.  --  Her last scans demonstrated NED.  We will repeat based on symptoms, i.e., fevers  or physical exam findings.   We will repeat her CBC, CMP and LDH in six months. Today's labs remain stable.  -- RTC in 6 months.  Patient also informed to return to clinic sooner if she develops symptoms in the interim.   Spent more than half the time coordinating care.   Belinda Sponsel, MD 9/2/20149:25 PM

## 2013-01-26 NOTE — Telephone Encounter (Signed)
gave pt appt for lab and MD on March 2015 °

## 2013-01-26 NOTE — Telephone Encounter (Signed)
Faxed hardcopy to [facility]. 

## 2013-01-26 NOTE — Patient Instructions (Addendum)
1. RTC in 6 months. 2. Repeat Labs in 6 months including CBC with differential, CMP and LDH.  3. Discussed to return sooner with symptoms including but not limited to persistent fever (temperature greater than 100.4) or night sweats or new palpable nodules.

## 2013-01-26 NOTE — Telephone Encounter (Signed)
Done hardcopy to robin  

## 2013-02-25 ENCOUNTER — Ambulatory Visit (INDEPENDENT_AMBULATORY_CARE_PROVIDER_SITE_OTHER): Payer: Medicare Other

## 2013-02-25 DIAGNOSIS — Z23 Encounter for immunization: Secondary | ICD-10-CM

## 2013-03-17 ENCOUNTER — Other Ambulatory Visit: Payer: Self-pay

## 2013-03-17 DIAGNOSIS — Z1231 Encounter for screening mammogram for malignant neoplasm of breast: Secondary | ICD-10-CM

## 2013-03-19 ENCOUNTER — Encounter: Payer: Self-pay | Admitting: Internal Medicine

## 2013-03-19 ENCOUNTER — Ambulatory Visit (INDEPENDENT_AMBULATORY_CARE_PROVIDER_SITE_OTHER): Payer: Medicare Other | Admitting: Internal Medicine

## 2013-03-19 VITALS — BP 172/91 | HR 73 | Ht 62.0 in | Wt 170.0 lb

## 2013-03-19 DIAGNOSIS — R6889 Other general symptoms and signs: Secondary | ICD-10-CM

## 2013-03-19 DIAGNOSIS — R0600 Dyspnea, unspecified: Secondary | ICD-10-CM

## 2013-03-19 DIAGNOSIS — R0609 Other forms of dyspnea: Secondary | ICD-10-CM

## 2013-03-19 DIAGNOSIS — I10 Essential (primary) hypertension: Secondary | ICD-10-CM

## 2013-03-19 MED ORDER — CHLORTHALIDONE 25 MG PO TABS: 25.0000 mg | ORAL_TABLET | Freq: Every day | ORAL | Status: DC

## 2013-03-19 NOTE — Assessment & Plan Note (Signed)
Still with come significant SOB   Will undertake Myoview scan with stress to look for chronotropic response. 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

## 2013-03-19 NOTE — Addendum Note (Signed)
Addended by: Freddi Starr on: 03/19/2013 12:44 PM   Modules accepted: Orders

## 2013-03-19 NOTE — Assessment & Plan Note (Signed)
Blood pressure measurements they were repeatedly 170. She will followup for blood pressures at home. She'll be in touch with Dr. Jonny Ruiz. As noted above, given the thought that she probably hasn't had past, I would use a diuretic, probably HCTZ, in the event that has persisted elevated measurements

## 2013-03-19 NOTE — Patient Instructions (Addendum)
Your physician wants you to follow-up in: ONE YEAR WITH DR Logan Bores will receive a reminder letter in the mail two months in advance. If you don't receive a letter, please call our office to schedule the follow-up appointment.   Your physician has requested that you have en exercise stress myoview. For further information please visit [email or web address] Please follow instruction sheet, as given.   START CHLORTHALIDONE 25 MG ONE TABLET ONCE DAILY  Your physician recommends that you return for lab work WITH STRESS TEST

## 2013-03-19 NOTE — Progress Notes (Signed)
      Patient Care Team: Corwin Levins, MD as PCP - General (Internal Medicine)   HPI  Belinda Mcdowell is a 72 y.o. female Seen in followup for symptomatic palpitations that were described as atrial fibrillation although by monitoring via been clarified as PVCs.  She has normal left ventricular function.  She has a history of exercise intolerance and sinus bradycardia. We stopped digoxin and beta blockers without significant improvement.  She continues to have significant DOE without chest pain or edema  Past Medical History  Diagnosis Date  . Generalized headaches   . UTI (lower urinary tract infection)   . Allergic rhinitis, cause unspecified   . Arthritis   . Depression   . Insomnia   . Hyperlipidemia   . Anxiety   . Non-Hodgkin lymphoma 11/18/2011    Stage 3  . Atrial fibrillation     by history; the third suggests PVCs  . Fibromyalgia 11/18/2011  . Mucous retention cyst of maxillary sinus 11/18/2011    Right   . Psoriasis 11/18/2011    On MTX  . Anemia, unspecified 11/18/2011    Prior on aranesp  . PUD (peptic ulcer disease) 11/18/2011    Duodenal at age 74 and 54  . Degenerative joint disease 11/18/2011    diffuse  . Lumbar disc disease 11/18/2011    Past Surgical History  Procedure Laterality Date  . Appendectomy    . Abdominal hysterectomy      for dysfunctional bleeding    Current Outpatient Prescriptions  Medication Sig Dispense Refill  . ALPRAZolam (XANAX) 0.5 MG tablet Take 1 tablet (0.5 mg total) by mouth 2 (two) times daily as needed for anxiety. To fill sept 5, 2014  60 tablet  2  . atorvastatin (LIPITOR) 20 MG tablet Take 1 tablet (20 mg total) by mouth daily.  90 tablet  3  . famotidine (PEPCID) 20 MG tablet Take 1 tablet (20 mg total) by mouth 2 (two) times daily.  180 tablet  3  . methotrexate (RHEUMATREX) 2.5 MG tablet Take 7.5 mg by mouth once a week.      Marland Kitchen PARoxetine (PAXIL) 20 MG tablet Take 1 tablet (20 mg total) by mouth every morning.  90  tablet  3  . tiZANidine (ZANAFLEX) 4 MG tablet Take 1 tablet (4 mg total) by mouth every 8 (eight) hours as needed.  90 tablet  3  . zolpidem (AMBIEN) 10 MG tablet Take 1 tablet (10 mg total) by mouth at bedtime as needed.  30 tablet  5   No current facility-administered medications for this visit.    No Known Allergies  Review of Systems negative except from HPI and PMH  Physical Exam BP 172/91  Pulse 73  Ht 5\' 2"  (1.575 m)  Wt 170 lb (77.111 kg)  BMI 31.09 kg/m2 Well developed and nourished in no acute distress HENT normal Neck supple with JVP-flat Clear Regular rate and rhythm, no murmurs or gallops Abd-soft with active BS No Clubbing cyanosis edema Skin-warm and dry A & Oriented  Grossly normal sensory and motor function     Assessment and  Plan

## 2013-04-06 ENCOUNTER — Other Ambulatory Visit: Payer: Medicare Other

## 2013-04-08 ENCOUNTER — Encounter (HOSPITAL_COMMUNITY): Payer: Medicare Other

## 2013-04-15 ENCOUNTER — Encounter (HOSPITAL_COMMUNITY): Payer: Medicare Other

## 2013-04-29 ENCOUNTER — Other Ambulatory Visit: Payer: Self-pay

## 2013-04-29 MED ORDER — ZOLPIDEM TARTRATE 10 MG PO TABS: 10.0000 mg | ORAL_TABLET | Freq: Every evening | ORAL | Status: DC | PRN

## 2013-04-29 NOTE — Telephone Encounter (Signed)
Faxed hardcopy to [facility]. 

## 2013-04-29 NOTE — Telephone Encounter (Signed)
Done hardcopy to robin  

## 2013-05-04 ENCOUNTER — Other Ambulatory Visit: Payer: Self-pay

## 2013-05-04 ENCOUNTER — Ambulatory Visit
Admission: RE | Admit: 2013-05-04 | Discharge: 2013-05-04 | Disposition: A | Discharge status: Home / Self Care | Payer: Medicare Other | Source: Ambulatory Visit

## 2013-05-04 DIAGNOSIS — Z1231 Encounter for screening mammogram for malignant neoplasm of breast: Secondary | ICD-10-CM

## 2013-05-04 MED ORDER — ALPRAZOLAM 0.5 MG PO TABS: 0.5000 mg | ORAL_TABLET | Freq: Two times a day (BID) | ORAL | Status: DC | PRN

## 2013-05-04 NOTE — Telephone Encounter (Signed)
Done hardcopy to robin  

## 2013-05-04 NOTE — Telephone Encounter (Signed)
Faxed hardcopy to [facility]. 

## 2013-05-18 ENCOUNTER — Other Ambulatory Visit: Payer: Self-pay | Admitting: Internal Medicine

## 2013-05-18 DIAGNOSIS — R928 Other abnormal and inconclusive findings on diagnostic imaging of breast: Secondary | ICD-10-CM

## 2013-05-25 ENCOUNTER — Other Ambulatory Visit: Payer: Self-pay

## 2013-05-25 MED ORDER — TIZANIDINE HCL 4 MG PO TABS: 4.0000 mg | ORAL_TABLET | Freq: Three times a day (TID) | ORAL | Status: DC | PRN

## 2013-06-01 ENCOUNTER — Telehealth: Payer: Self-pay | Admitting: *Deleted

## 2013-06-01 ENCOUNTER — Ambulatory Visit
Admission: RE | Admit: 2013-06-01 | Discharge: 2013-06-01 | Disposition: A | Discharge status: Home / Self Care | Payer: Medicare Other | Source: Ambulatory Visit | Attending: Internal Medicine | Admitting: Internal Medicine

## 2013-06-01 DIAGNOSIS — R928 Other abnormal and inconclusive findings on diagnostic imaging of breast: Secondary | ICD-10-CM

## 2013-06-01 LAB — HM MAMMOGRAPHY

## 2013-06-01 NOTE — Telephone Encounter (Signed)
Called pt to discuss cancellation of recommended stress testing by Dr. Caryl Comes. Pt states that she will not be rescheduling, she says she wants to find another cardiologist. I advised her that I would document this in her chart. Pt agreeable to plan.

## 2013-06-10 ENCOUNTER — Ambulatory Visit (INDEPENDENT_AMBULATORY_CARE_PROVIDER_SITE_OTHER): Payer: Medicare Other | Admitting: Internal Medicine

## 2013-06-10 ENCOUNTER — Encounter: Payer: Self-pay | Admitting: Internal Medicine

## 2013-06-10 VITALS — BP 142/82 | HR 72 | Temp 97.9°F | Ht 63.0 in | Wt 165.5 lb

## 2013-06-10 DIAGNOSIS — F419 Anxiety disorder, unspecified: Secondary | ICD-10-CM

## 2013-06-10 DIAGNOSIS — Z23 Encounter for immunization: Secondary | ICD-10-CM

## 2013-06-10 DIAGNOSIS — I10 Essential (primary) hypertension: Secondary | ICD-10-CM

## 2013-06-10 DIAGNOSIS — E785 Hyperlipidemia, unspecified: Secondary | ICD-10-CM

## 2013-06-10 DIAGNOSIS — F411 Generalized anxiety disorder: Secondary | ICD-10-CM

## 2013-06-10 MED ORDER — ATORVASTATIN CALCIUM 20 MG PO TABS: 20.0000 mg | ORAL_TABLET | Freq: Every day | ORAL | Status: DC

## 2013-06-10 MED ORDER — HYDROCHLOROTHIAZIDE 12.5 MG PO TABS: 12.5000 mg | ORAL_TABLET | Freq: Every day | ORAL | Status: DC

## 2013-06-10 NOTE — Patient Instructions (Addendum)
You had the new Prevnar pneumonia shot today  Please take all new medication as prescribed - the low dose hydrochlorothiazide at 12.5 mg per day (sent to your mailin pharmacy)  Please continue all other medications as before, and refills have been done if requested, including the lipitor  Please call if you wish a referral for counseling  Please return in 6 months, or sooner if needed

## 2013-06-10 NOTE — Progress Notes (Signed)
Subjective:    Patient ID: Belinda Mcdowell, female    DOB: 07-22-1940, 73 y.o.   MRN: [id number]  HPI   Here to f/u; overall doing ok,  Pt denies chest pain, increased sob or doe, wheezing, orthopnea, PND, increased LE swelling, palpitations, dizziness or syncope.  Pt denies polydipsia, polyuria, or low sugar symptoms such as weakness or confusion improved with po intake.  Pt denies new neurological symptoms such as new headache, or facial or extremity weakness or numbness.   Pt states overall good compliance with meds, has been trying to follow lower cholesterol diet, with wt overall stable/.  Denies worsening depressive symptoms, suicidal ideation, or panic; has ongoing anxiety, overall stable per pt.  Might look in to selfreferral for counseling. Wants to get back on lipitor after off much of the past yr. Pt not happy with her relation with Dr Caryl Comes, does not plan to f/u or have the stress test.  Did take the chlorthalidone for a short time, but did not like the diuretic effect adn expensive at $75 for 1 mo, so stopped. She is willing to try hct.  Past Medical History  Diagnosis Date  . Generalized headaches   . UTI (lower urinary tract infection)   . Allergic rhinitis, cause unspecified   . Arthritis   . Depression   . Insomnia   . Hyperlipidemia   . Anxiety   . Non-Hodgkin lymphoma 11/18/2011    Stage 3  . PVC's (premature ventricular contractions)     by holter  previously thought to be A. fib  . Fibromyalgia 11/18/2011  . Mucous retention cyst of maxillary sinus 11/18/2011    Right   . Psoriasis 11/18/2011    On MTX  . Anemia, unspecified 11/18/2011    Prior on aranesp  . PUD (peptic ulcer disease) 11/18/2011    Duodenal at age 49 and 5  . Degenerative joint disease 11/18/2011    diffuse  . Lumbar disc disease 11/18/2011  . Sinus bradycardia    Past Surgical History  Procedure Laterality Date  . Appendectomy    . Abdominal hysterectomy      for dysfunctional bleeding    reports that she has quit smoking. Her smoking use included Cigarettes. She has a 3.75 pack-year smoking history. She has never used smokeless tobacco. She reports that she does not drink alcohol or use illicit drugs. family history includes Arthritis in her other. No Known Allergies Current Outpatient Prescriptions on File Prior to Visit  Medication Sig Dispense Refill  . ALPRAZolam (XANAX) 0.5 MG tablet Take 1 tablet (0.5 mg total) by mouth 2 (two) times daily as needed for anxiety. To fill sept 5, 2014  60 tablet  2  . famotidine (PEPCID) 20 MG tablet Take 1 tablet (20 mg total) by mouth 2 (two) times daily.  180 tablet  3  . methotrexate (RHEUMATREX) 2.5 MG tablet Take 7.5 mg by mouth once a week.      Marland Kitchen PARoxetine (PAXIL) 20 MG tablet Take 1 tablet (20 mg total) by mouth every morning.  90 tablet  3  . tiZANidine (ZANAFLEX) 4 MG tablet Take 1 tablet (4 mg total) by mouth every 8 (eight) hours as needed.  90 tablet  3  . zolpidem (AMBIEN) 10 MG tablet Take 1 tablet (10 mg total) by mouth at bedtime as needed.  30 tablet  5   No current facility-administered medications on file prior to visit.   Review of Systems  Constitutional: Negative for  unexpected weight change, or unusual diaphoresis  HENT: Negative for tinnitus.   Eyes: Negative for photophobia and visual disturbance.  Respiratory: Negative for choking and stridor.   Gastrointestinal: Negative for vomiting and blood in stool.  Genitourinary: Negative for hematuria and decreased urine volume.  Musculoskeletal: Negative for acute joint swelling Skin: Negative for color change and wound.  Neurological: Negative for tremors and numbness other than noted  Psychiatric/Behavioral: Negative for decreased concentration or  hyperactivity.       Objective:   Physical Exam BP 142/82  Pulse 72  Temp(Src) 97.9 F (36.6 C) (Oral)  Ht 5\' 3"  (1.6 m)  Wt 165 lb 8 oz (75.07 kg)  BMI 29.32 kg/m2  SpO2 96% VS noted,  Constitutional: Pt  appears well-developed and well-nourished.  HENT: Head: NCAT.  Right Ear: External ear normal.  Left Ear: External ear normal.  Eyes: Conjunctivae and EOM are normal. Pupils are equal, round, and reactive to light.  Neck: Normal range of motion. Neck supple.  Cardiovascular: Normal rate and regular rhythm.   Pulmonary/Chest: Effort normal and breath sounds normal.  Neurological: Pt is alert. Not confused  Skin: Skin is warm. No erythema.  Psychiatric: Pt behavior is normal. Thought content normal. 1+ edema    Assessment & Plan:

## 2013-06-10 NOTE — Assessment & Plan Note (Signed)
Ok to start hct 12.5 qd, f/u at home and next visit  BP Readings from Last 3 Encounters:  06/10/13 142/82  03/19/13 172/91  01/26/13 149/97

## 2013-06-10 NOTE — Progress Notes (Signed)
Pre-visit discussion using our clinic review tool. No additional management support is needed unless otherwise documented below in the visit note.  

## 2013-06-11 ENCOUNTER — Telehealth: Payer: Self-pay | Admitting: Internal Medicine

## 2013-06-11 NOTE — Telephone Encounter (Signed)
Relevant patient education assigned to patient using Emmi. ° °

## 2013-06-12 NOTE — Assessment & Plan Note (Signed)
I think would benefit from counseling, declines referall for now, may call back later or self refer

## 2013-06-12 NOTE — Assessment & Plan Note (Signed)
Uncontrolled, re-start lipitor 20 ,  to f/u any worsening symptoms or concerns

## 2013-07-23 ENCOUNTER — Telehealth: Payer: Self-pay | Admitting: Internal Medicine

## 2013-07-23 NOTE — Telephone Encounter (Signed)
pt called and r/s appt from march to April lab and MD

## 2013-07-26 ENCOUNTER — Ambulatory Visit: Payer: Medicare Other

## 2013-07-26 ENCOUNTER — Other Ambulatory Visit: Payer: Medicare Other

## 2013-08-03 ENCOUNTER — Telehealth: Payer: Self-pay | Admitting: *Deleted

## 2013-08-03 NOTE — Telephone Encounter (Signed)
Patient phoned requesting refills be sent to [facility], but I'm sorry-I could NOT understand her.  CB# [phone]

## 2013-08-03 NOTE — Telephone Encounter (Signed)
Called left message to call back 

## 2013-08-04 NOTE — Addendum Note (Signed)
Addended by: Sharon Seller B on: 08/04/2013 10:53 AM   Modules accepted: Orders

## 2013-08-04 NOTE — Telephone Encounter (Signed)
Called left message to call back 

## 2013-09-03 ENCOUNTER — Telehealth: Payer: Self-pay | Admitting: Internal Medicine

## 2013-09-03 NOTE — Telephone Encounter (Signed)
Called pt,left message r/s appt from 4/14 to 429 per MD, mailed appt

## 2013-09-07 ENCOUNTER — Ambulatory Visit: Payer: Medicare Other

## 2013-09-07 ENCOUNTER — Other Ambulatory Visit: Payer: Medicare Other

## 2013-09-20 ENCOUNTER — Telehealth: Payer: Self-pay | Admitting: Internal Medicine

## 2013-09-20 NOTE — Telephone Encounter (Signed)
Pt called and cancelled all appts, pt has fracture and cannot drive, nurse notified

## 2013-09-22 ENCOUNTER — Other Ambulatory Visit: Payer: Self-pay

## 2013-09-22 ENCOUNTER — Other Ambulatory Visit: Payer: Medicare Other

## 2013-09-22 ENCOUNTER — Ambulatory Visit: Payer: Medicare Other

## 2013-09-22 NOTE — Progress Notes (Signed)
S/w pt and she is in leg boot. Cannot drive. She asked to be r/s in about 2 months. POF sent. Dr Juliann Mule informed.

## 2013-09-23 ENCOUNTER — Other Ambulatory Visit: Payer: Self-pay

## 2013-09-23 MED ORDER — ALPRAZOLAM 0.5 MG PO TABS
0.5000 mg | ORAL_TABLET | Freq: Two times a day (BID) | ORAL | Status: DC | PRN
Start: 2013-09-23 — End: 2013-12-28

## 2013-09-23 NOTE — Telephone Encounter (Signed)
Done hardcopy to robin  

## 2013-09-23 NOTE — Telephone Encounter (Signed)
Faxed hardcopy to [facility]. 

## 2013-10-14 ENCOUNTER — Telehealth: Payer: Self-pay | Admitting: *Deleted

## 2013-10-14 MED ORDER — ZOLPIDEM TARTRATE 10 MG PO TABS: 10.0000 mg | ORAL_TABLET | Freq: Every evening | ORAL | Status: DC | PRN

## 2013-10-14 NOTE — Telephone Encounter (Signed)
Done hardcopy to robin  

## 2013-10-14 NOTE — Telephone Encounter (Signed)
Pt called requesting Ambien refill.  Last refill 12.4.14.  Please advise

## 2013-10-15 NOTE — Telephone Encounter (Signed)
Patient called back and faxed hardcopy to [facility].

## 2013-10-15 NOTE — Telephone Encounter (Signed)
Called left message to call back with pharmacy to fax refill to.

## 2013-10-26 ENCOUNTER — Telehealth: Payer: Self-pay | Admitting: Internal Medicine

## 2013-10-26 ENCOUNTER — Other Ambulatory Visit: Payer: Self-pay

## 2013-10-26 MED ORDER — TIZANIDINE HCL 4 MG PO TABS: 4.0000 mg | ORAL_TABLET | Freq: Three times a day (TID) | ORAL | Status: DC | PRN

## 2013-10-26 NOTE — Telephone Encounter (Signed)
[facility] called back to inform PA for Zolpidem has been approved for one year.  They will fax letter to our office and home of patient.  Called the patient to inform had to leave a message to call back

## 2013-10-26 NOTE — Telephone Encounter (Signed)
Called the patient informed of PA approval.

## 2013-10-26 NOTE — Telephone Encounter (Signed)
Pt called stated that [facility] will not send her zolpidem unless they get PA from [facility]. Please call pt about his [phone], pt only got 4 pill left.

## 2013-10-26 NOTE — Telephone Encounter (Signed)
PA has been completed and faxed to [facility].  Have spoken to the patient this morning to inform waiting for response from [facility].  Will call the patient once I receive decision.

## 2013-10-27 ENCOUNTER — Telehealth: Payer: Self-pay | Admitting: Internal Medicine

## 2013-10-27 NOTE — Telephone Encounter (Signed)
Called the patient left a detailed message to contact [employer] to have form faxed to [place].  At this time we have not received a PA request for this medication.

## 2013-10-27 NOTE — Telephone Encounter (Signed)
Pt called stated that [employer] called to her get our office to start PA for Tizanidine 4 mg. Please call pt, she request to speak to the assistant.

## 2013-10-28 ENCOUNTER — Other Ambulatory Visit: Payer: Self-pay

## 2013-10-28 MED ORDER — TIZANIDINE HCL 4 MG PO TABS: 4.0000 mg | ORAL_TABLET | Freq: Three times a day (TID) | ORAL | Status: DC | PRN

## 2013-10-28 NOTE — Telephone Encounter (Signed)
Returned [facility] call back at [phone]. They had called left message needing Diagnosis code and medications tried before using Tizanidine 4 mg. Did give [employer] RN the information they needed.  She stated that the information would be enough to get the medication approved and brought down to a tier 1 cost for this patient.  They will contact the patient of approval.

## 2013-10-29 ENCOUNTER — Telehealth: Payer: Self-pay | Admitting: *Deleted

## 2013-10-29 NOTE — Telephone Encounter (Signed)
Belinda Mcdowell from [facility] called states Tizanidine is approved from [id number].

## 2013-12-01 ENCOUNTER — Ambulatory Visit: Payer: Medicare Other | Admitting: Internal Medicine

## 2013-12-07 ENCOUNTER — Ambulatory Visit (INDEPENDENT_AMBULATORY_CARE_PROVIDER_SITE_OTHER): Payer: Medicare Other | Admitting: Internal Medicine

## 2013-12-07 ENCOUNTER — Encounter: Payer: Self-pay | Admitting: Internal Medicine

## 2013-12-07 VITALS — BP 128/88 | HR 70 | Temp 98.6°F | Wt 170.5 lb

## 2013-12-07 DIAGNOSIS — R350 Frequency of micturition: Secondary | ICD-10-CM

## 2013-12-07 DIAGNOSIS — Z0000 Encounter for general adult medical examination without abnormal findings: Secondary | ICD-10-CM

## 2013-12-07 DIAGNOSIS — J309 Allergic rhinitis, unspecified: Secondary | ICD-10-CM

## 2013-12-07 MED ORDER — CETIRIZINE HCL 10 MG PO TABS: 10.0000 mg | ORAL_TABLET | Freq: Every day | ORAL | Status: DC

## 2013-12-07 NOTE — Patient Instructions (Signed)
Please take all new medication as prescribed - the zyrtec  Please continue all other medications as before, and refills have been done if requested.  Please have the pharmacy call with any other refills you may need.  Please continue your efforts at being more active, low cholesterol diet, and weight control.  You are otherwise up to date with prevention measures today.  Please keep your appointments with your specialists as you may have planned  Please go to the LAB in the Basement (turn left off the elevator) for the tests to be done tomorrow or after  You will be contacted by phone if any changes need to be made immediately.  Otherwise, you will receive a letter about your results with an explanation, but please check with MyChart first.  Please remember to sign up for MyChart if you have not done so, as this will be important to you in the future with finding out test results, communicating by private email, and scheduling acute appointments online when needed.  Please return in 6 months, or sooner if needed

## 2013-12-07 NOTE — Progress Notes (Signed)
Pre visit review using our clinic review tool, if applicable. No additional management support is needed unless otherwise documented below in the visit note. 

## 2013-12-07 NOTE — Progress Notes (Signed)
Subjective:    Patient ID: Belinda Mcdowell, female    DOB: 28-Apr-1941, 73 y.o.   MRN: [id number]  HPI  Here for wellness and f/u;  Overall doing ok;  Pt denies CP, worsening SOB, DOE, wheezing, orthopnea, PND, worsening LE edema, palpitations, dizziness or syncope.  Pt denies neurological change such as new headache, facial or extremity weakness.  Pt denies polydipsia, polyuria, or low sugar symptoms. Pt states overall good compliance with treatment and medications, good tolerability, and has been trying to follow lower cholesterol diet.  Pt denies worsening depressive symptoms, suicidal ideation or panic. No fever, night sweats, wt loss, loss of appetite, or other constitutional symptoms.  Pt states good ability with ADL's, has low fall risk, home safety reviewed and adequate, no other significant changes in hearing or vision, and only occasionally active with exercise.  Did have some wt gain with right foot fx x 3 mo, still some pain with 3 fx after fall.   Also with urinary freq for 4 wks, asks for urine study, has remote hx of infection.  Does have several wks ongoing nasal allergy symptoms with clearish congestion, itch and sneezing, without fever, pain, ST, cough, swelling or wheezing. Past Medical History  Diagnosis Date  . Generalized headaches   . UTI (lower urinary tract infection)   . Allergic rhinitis, cause unspecified   . Arthritis   . Depression   . Insomnia   . Hyperlipidemia   . Anxiety   . Non-Hodgkin lymphoma 11/18/2011    Stage 3  . PVC's (premature ventricular contractions)     by holter  previously thought to be A. fib  . Fibromyalgia 11/18/2011  . Mucous retention cyst of maxillary sinus 11/18/2011    Right   . Psoriasis 11/18/2011    On MTX  . Anemia, unspecified 11/18/2011    Prior on aranesp  . PUD (peptic ulcer disease) 11/18/2011    Duodenal at age 58 and 77  . Degenerative joint disease 11/18/2011    diffuse  . Lumbar disc disease 11/18/2011  . Sinus bradycardia     Past Surgical History  Procedure Laterality Date  . Appendectomy    . Abdominal hysterectomy      for dysfunctional bleeding    reports that she has quit smoking. Her smoking use included Cigarettes. She has a 3.75 pack-year smoking history. She has never used smokeless tobacco. She reports that she does not drink alcohol or use illicit drugs. family history includes Arthritis in her other. No Known Allergies Current Outpatient Prescriptions on File Prior to Visit  Medication Sig Dispense Refill  . ALPRAZolam (XANAX) 0.5 MG tablet Take 1 tablet (0.5 mg total) by mouth 2 (two) times daily as needed for anxiety. To fill sept 5, 2014  60 tablet  2  . atorvastatin (LIPITOR) 20 MG tablet Take 1 tablet (20 mg total) by mouth daily.  90 tablet  3  . famotidine (PEPCID) 20 MG tablet Take 1 tablet (20 mg total) by mouth 2 (two) times daily.  180 tablet  3  . hydrochlorothiazide (HYDRODIURIL) 12.5 MG tablet Take 1 tablet (12.5 mg total) by mouth daily.  90 tablet  3  . methotrexate (RHEUMATREX) 2.5 MG tablet Take 7.5 mg by mouth once a week.      Marland Kitchen tiZANidine (ZANAFLEX) 4 MG tablet Take 1 tablet (4 mg total) by mouth every 8 (eight) hours as needed.  90 tablet  3  . zolpidem (AMBIEN) 10 MG tablet Take 1 tablet (  10 mg total) by mouth at bedtime as needed.  30 tablet  5  . PARoxetine (PAXIL) 20 MG tablet Take 1 tablet (20 mg total) by mouth every morning.  90 tablet  3   No current facility-administered medications on file prior to visit.   Review of Systems Constitutional: Negative for increased diaphoresis, other activity, appetite or other siginficant weight change  HENT: Negative for worsening hearing loss, ear pain, facial swelling, mouth sores and neck stiffness.   Eyes: Negative for other worsening pain, redness or visual disturbance.  Respiratory: Negative for shortness of breath and wheezing.   Cardiovascular: Negative for chest pain and palpitations.  Gastrointestinal: Negative for  diarrhea, blood in stool, abdominal distention or other pain Genitourinary: Negative for hematuria, flank pain or change in urine volume.  Musculoskeletal: Negative for myalgias or other joint complaints.  Skin: Negative for color change and wound.  Neurological: Negative for syncope and numbness. other than noted Hematological: Negative for adenopathy. or other swelling Psychiatric/Behavioral: Negative for hallucinations, self-injury, decreased concentration or other worsening agitation.      Objective:   Physical Exam BP 128/88  Pulse 70  Temp(Src) 98.6 F (37 C) (Oral)  Wt 170 lb 8 oz (77.338 kg)  SpO2 95% VS noted,  Constitutional: Pt is oriented to person, place, and time. Appears well-developed and well-nourished.  Head: Normocephalic and atraumatic.  Right Ear: External ear normal.  Left Ear: External ear normal.  Nose: Nose normal.  Mouth/Throat: Oropharynx is clear and moist.  Eyes: Conjunctivae and EOM are normal. Pupils are equal, round, and reactive to light.  Neck: Normal range of motion. Neck supple. No JVD present. No tracheal deviation present.  Bilat tm's with mild erythema.  Max sinus areas mild tender.  Pharynx with mild erythema, no exudate Cardiovascular: Normal rate, regular rhythm, normal heart sounds and intact distal pulses.   Pulmonary/Chest: Effort normal and breath sounds without rales or wheezing  Abdominal: Soft. Bowel sounds are normal. NT. No HSM  Musculoskeletal: Normal range of motion. Exhibits no edema.  Lymphadenopathy:  Has no cervical adenopathy.  Neurological: Pt is alert and oriented to person, place, and time. Pt has normal reflexes. No cranial nerve deficit. Motor grossly intact Skin: Skin is warm and dry. No rash noted.  Psychiatric:  Has normal mood and affect. Behavior is normal.      Assessment & Plan:

## 2013-12-08 ENCOUNTER — Other Ambulatory Visit (INDEPENDENT_AMBULATORY_CARE_PROVIDER_SITE_OTHER): Payer: Medicare Other

## 2013-12-08 DIAGNOSIS — Z0000 Encounter for general adult medical examination without abnormal findings: Secondary | ICD-10-CM

## 2013-12-08 DIAGNOSIS — E785 Hyperlipidemia, unspecified: Secondary | ICD-10-CM

## 2013-12-08 LAB — CBC WITH DIFFERENTIAL/PLATELET
Basophils Absolute: 0 10*3/uL (ref 0.0–0.1)
Basophils Relative: 0.3 % (ref 0.0–3.0)
EOS PCT: 3.7 % (ref 0.0–5.0)
Eosinophils Absolute: 0.2 10*3/uL (ref 0.0–0.7)
HEMATOCRIT: 38.8 % (ref 36.0–46.0)
Hemoglobin: 13.1 g/dL (ref 12.0–15.0)
LYMPHS ABS: 1.1 10*3/uL (ref 0.7–4.0)
Lymphocytes Relative: 15.9 % (ref 12.0–46.0)
MCHC: 33.9 g/dL (ref 30.0–36.0)
MCV: 88.6 fl (ref 78.0–100.0)
MONOS PCT: 7.8 % (ref 3.0–12.0)
Monocytes Absolute: 0.5 10*3/uL (ref 0.1–1.0)
NEUTROS PCT: 72.3 % (ref 43.0–77.0)
Neutro Abs: 4.9 10*3/uL (ref 1.4–7.7)
Platelets: 167 10*3/uL (ref 150.0–400.0)
RBC: 4.38 Mil/uL (ref 3.87–5.11)
RDW: 16 % — ABNORMAL HIGH (ref 11.5–15.5)
WBC: 6.7 10*3/uL (ref 4.0–10.5)

## 2013-12-08 LAB — URINALYSIS, ROUTINE W REFLEX MICROSCOPIC
Bilirubin Urine: NEGATIVE
Ketones, ur: NEGATIVE
NITRITE: NEGATIVE
SPECIFIC GRAVITY, URINE: 1.01 (ref 1.000–1.030)
Urine Glucose: NEGATIVE
Urobilinogen, UA: 0.2 (ref 0.0–1.0)
pH: 6.5 (ref 5.0–8.0)

## 2013-12-08 LAB — LIPID PANEL
Cholesterol: 163 mg/dL (ref 0–200)
HDL: 52.6 mg/dL (ref >39.00)
LDL CALC: 60 mg/dL (ref 0–99)
NonHDL: 110.4
TRIGLYCERIDES: 250 mg/dL — AB (ref 0.0–149.0)
Total CHOL/HDL Ratio: 3
VLDL: 50 mg/dL — ABNORMAL HIGH (ref 0.0–40.0)

## 2013-12-08 LAB — BASIC METABOLIC PANEL
BUN: 17 mg/dL (ref 6–23)
CHLORIDE: 101 meq/L (ref 96–112)
CO2: 35 mEq/L — ABNORMAL HIGH (ref 19–32)
CREATININE: 0.6 mg/dL (ref 0.4–1.2)
Calcium: 9.7 mg/dL (ref 8.4–10.5)
GFR: 110.2 mL/min (ref >60.00)
Glucose, Bld: 93 mg/dL (ref 70–99)
Potassium: 4.1 mEq/L (ref 3.5–5.1)
Sodium: 138 mEq/L (ref 135–145)

## 2013-12-08 LAB — HEPATIC FUNCTION PANEL
ALT: 33 U/L (ref 0–35)
AST: 27 U/L (ref 0–37)
Albumin: 4.4 g/dL (ref 3.5–5.2)
Alkaline Phosphatase: 69 U/L (ref 39–117)
BILIRUBIN TOTAL: 0.8 mg/dL (ref 0.2–1.2)
Bilirubin, Direct: 0.1 mg/dL (ref 0.0–0.3)
Total Protein: 6.3 g/dL (ref 6.0–8.3)

## 2013-12-08 LAB — TSH: TSH: 1.41 u[IU]/mL (ref 0.35–4.50)

## 2013-12-09 ENCOUNTER — Ambulatory Visit: Payer: Medicare Other | Admitting: Internal Medicine

## 2013-12-09 ENCOUNTER — Other Ambulatory Visit: Payer: Self-pay | Admitting: Internal Medicine

## 2013-12-09 MED ORDER — CEPHALEXIN 500 MG PO CAPS: 500.0000 mg | ORAL_CAPSULE | Freq: Four times a day (QID) | ORAL | Status: DC

## 2013-12-10 DIAGNOSIS — R350 Frequency of micturition: Secondary | ICD-10-CM | POA: Insufficient documentation

## 2013-12-10 NOTE — Assessment & Plan Note (Signed)
For ua, r/o infection but ?OAB, consider vesicare trial

## 2013-12-10 NOTE — Assessment & Plan Note (Signed)
For zyrtec qd prn

## 2013-12-10 NOTE — Assessment & Plan Note (Signed)

## 2013-12-24 ENCOUNTER — Other Ambulatory Visit: Payer: Self-pay

## 2013-12-24 MED ORDER — FAMOTIDINE 20 MG PO TABS: 20.0000 mg | ORAL_TABLET | Freq: Two times a day (BID) | ORAL | Status: DC

## 2013-12-28 ENCOUNTER — Other Ambulatory Visit: Payer: Self-pay

## 2013-12-28 MED ORDER — ALPRAZOLAM 0.5 MG PO TABS: 0.5000 mg | ORAL_TABLET | Freq: Two times a day (BID) | ORAL | Status: DC | PRN

## 2013-12-28 NOTE — Telephone Encounter (Signed)
Done hardcopy to robin  

## 2013-12-29 ENCOUNTER — Other Ambulatory Visit: Payer: Self-pay

## 2013-12-29 MED ORDER — PAROXETINE HCL 20 MG PO TABS: 20.0000 mg | ORAL_TABLET | ORAL | Status: DC

## 2013-12-29 NOTE — Telephone Encounter (Signed)
Faxed hardcopy to [employer] [employer]

## 2013-12-29 NOTE — Telephone Encounter (Signed)
Done erx 

## 2014-03-14 ENCOUNTER — Other Ambulatory Visit: Payer: Self-pay

## 2014-03-14 MED ORDER — HYDROCHLOROTHIAZIDE 12.5 MG PO TABS: 12.5000 mg | ORAL_TABLET | Freq: Every day | ORAL | Status: DC

## 2014-03-17 ENCOUNTER — Ambulatory Visit (INDEPENDENT_AMBULATORY_CARE_PROVIDER_SITE_OTHER): Payer: Medicare Other

## 2014-03-17 DIAGNOSIS — Z23 Encounter for immunization: Secondary | ICD-10-CM

## 2014-04-19 ENCOUNTER — Ambulatory Visit (INDEPENDENT_AMBULATORY_CARE_PROVIDER_SITE_OTHER): Payer: Medicare Other | Admitting: Internal Medicine

## 2014-04-19 ENCOUNTER — Telehealth: Payer: Self-pay

## 2014-04-19 ENCOUNTER — Encounter: Payer: Self-pay | Admitting: Internal Medicine

## 2014-04-19 ENCOUNTER — Other Ambulatory Visit: Payer: Medicare Other

## 2014-04-19 VITALS — BP 120/82 | HR 69 | Temp 98.3°F | Ht 61.0 in | Wt 172.5 lb

## 2014-04-19 DIAGNOSIS — I10 Essential (primary) hypertension: Secondary | ICD-10-CM

## 2014-04-19 DIAGNOSIS — R109 Unspecified abdominal pain: Secondary | ICD-10-CM

## 2014-04-19 DIAGNOSIS — R300 Dysuria: Secondary | ICD-10-CM

## 2014-04-19 LAB — POCT URINALYSIS DIPSTICK
Bilirubin, UA: NEGATIVE
Glucose, UA: NEGATIVE
Ketones, UA: NEGATIVE
NITRITE UA: NEGATIVE
PROTEIN UA: NEGATIVE
Spec Grav, UA: 1.02
UROBILINOGEN UA: NEGATIVE
pH, UA: 7

## 2014-04-19 MED ORDER — CEPHALEXIN 500 MG PO CAPS: 500.0000 mg | ORAL_CAPSULE | Freq: Four times a day (QID) | ORAL | Status: DC

## 2014-04-19 MED ORDER — CEFTRIAXONE SODIUM 1 G IJ SOLR
1.0000 g | Freq: Once | INTRAMUSCULAR | Status: AC
  Administered 2014-04-19: 1 g via INTRAMUSCULAR

## 2014-04-19 NOTE — Telephone Encounter (Signed)
[place] for 3pm visit

## 2014-04-19 NOTE — Progress Notes (Signed)
Subjective:    Patient ID: Belinda Mcdowell, female    DOB: April 30, 1941, 72 y.o.   MRN: [id number]  HPI  Here with 2-3 days acute onset dysuria, frequency, but no urgency,  hematuria or n/v, fever, chills.  Does have some minor nauasea ahd right flank pain. Pt denies chest pain, increased sob or doe, wheezing, orthopnea, PND, increased LE swelling, palpitations, dizziness or syncope.   Pt denies polydipsia, polyuria.  Has hx of UTI, this is second for 2015. Past Medical History  Diagnosis Date  . Generalized headaches   . UTI (lower urinary tract infection)   . Allergic rhinitis, cause unspecified   . Arthritis   . Depression   . Insomnia   . Hyperlipidemia   . Anxiety   . Non-Hodgkin lymphoma 11/18/2011    Stage 3  . PVC's (premature ventricular contractions)     by holter  previously thought to be A. fib  . Fibromyalgia 11/18/2011  . Mucous retention cyst of maxillary sinus 11/18/2011    Right   . Psoriasis 11/18/2011    On MTX  . Anemia, unspecified 11/18/2011    Prior on aranesp  . PUD (peptic ulcer disease) 11/18/2011    Duodenal at age 45 and 29  . Degenerative joint disease 11/18/2011    diffuse  . Lumbar disc disease 11/18/2011  . Sinus bradycardia    Past Surgical History  Procedure Laterality Date  . Appendectomy    . Abdominal hysterectomy      for dysfunctional bleeding    reports that she has quit smoking. Her smoking use included Cigarettes. She has a 3.75 pack-year smoking history. She has never used smokeless tobacco. She reports that she does not drink alcohol or use illicit drugs. family history includes Arthritis in her other. No Known Allergies Current Outpatient Prescriptions on File Prior to Visit  Medication Sig Dispense Refill  . ALPRAZolam (XANAX) 0.5 MG tablet Take 1 tablet (0.5 mg total) by mouth 2 (two) times daily as needed for anxiety. 60 tablet 2  . atorvastatin (LIPITOR) 20 MG tablet Take 1 tablet (20 mg total) by mouth daily. 90 tablet 3  .  cetirizine (ZYRTEC) 10 MG tablet Take 1 tablet (10 mg total) by mouth daily. 30 tablet 11  . famotidine (PEPCID) 20 MG tablet Take 1 tablet (20 mg total) by mouth 2 (two) times daily. 180 tablet 3  . hydrochlorothiazide (HYDRODIURIL) 12.5 MG tablet Take 1 tablet (12.5 mg total) by mouth daily. 90 tablet 1  . methotrexate (RHEUMATREX) 2.5 MG tablet Take 7.5 mg by mouth once a week.    Marland Kitchen PARoxetine (PAXIL) 20 MG tablet Take 1 tablet (20 mg total) by mouth every morning. 90 tablet 3  . tiZANidine (ZANAFLEX) 4 MG tablet Take 1 tablet (4 mg total) by mouth every 8 (eight) hours as needed. 90 tablet 3  . zolpidem (AMBIEN) 10 MG tablet Take 1 tablet (10 mg total) by mouth at bedtime as needed. 30 tablet 5   No current facility-administered medications on file prior to visit.    Review of Systems  Constitutional: Negative for unusual diaphoresis or other sweats  HENT: Negative for ringing in ear Eyes: Negative for double vision or worsening visual disturbance.  Respiratory: Negative for choking and stridor.   Gastrointestinal: Negative for vomiting or other signifcant bowel change Genitourinary: Negative for hematuria or decreased urine volume.  Musculoskeletal: Negative for other MSK pain or swelling Skin: Negative for color change and worsening wound.  Neurological: Negative  for tremors and numbness other than noted  Psychiatric/Behavioral: Negative for decreased concentration or agitation other than above       Objective:   Physical Exam BP 120/82 mmHg  Pulse 69  Temp(Src) 98.3 F (36.8 C) (Oral)  Ht 5\' 1"  (1.549 m)  Wt 172 lb 8 oz (78.245 kg)  BMI 32.61 kg/m2  SpO2 96% VS noted, mild ill Constitutional: Pt appears well-developed, well-nourished.  HENT: Head: NCAT.  Right Ear: External ear normal.  Left Ear: External ear normal.  Eyes: . Pupils are equal, round, and reactive to light. Conjunctivae and EOM are normal Neck: Normal range of motion. Neck supple.  Cardiovascular:  Normal rate and regular rhythm.   Pulmonary/Chest: Effort normal and breath sounds normal.  Abd:  Soft, ND, + BS, mild tender low mid abd, without guarding or rebound Neurological: Pt is alert. Not confused , motor grossly intact Skin: Skin is warm. No rash Psychiatric: Pt behavior is normal. No agitation.     Assessment & Plan:

## 2014-04-19 NOTE — Telephone Encounter (Signed)
The patient thinks she may have a UTI.  States she is having burning and urgency to go.  She did request an antibiotic sent in if ok to [facility].  Call back number is [phone]

## 2014-04-19 NOTE — Progress Notes (Signed)
Pre visit review using our clinic review tool, if applicable. No additional management support is needed unless otherwise documented below in the visit note. 

## 2014-04-19 NOTE — Telephone Encounter (Signed)
Patient scheduled.

## 2014-04-19 NOTE — Patient Instructions (Signed)
You have had the antibiotic shot today (rocephin)  Please take all new medication as prescribed - the antibiotic pills  Your specimen will be sent to the lab for culture, with the results expected in 2-3 days.  Please continue all other medications as before, and refills have been done if requested.  Please have the pharmacy call with any other refills you may need.  Please keep your appointments with your specialists as you may have planned

## 2014-04-20 ENCOUNTER — Other Ambulatory Visit: Payer: Self-pay | Admitting: Internal Medicine

## 2014-04-20 LAB — URINE CULTURE

## 2014-04-20 NOTE — Telephone Encounter (Signed)
Done hardcopy to robin  

## 2014-04-20 NOTE — Assessment & Plan Note (Signed)
Mild to mod, likley uti related, for cephalexin course, f/u culture, declines labs today such as cbc

## 2014-04-20 NOTE — Assessment & Plan Note (Signed)
?   Early pyelonephritis, also for rocephin IM , f/u cx results

## 2014-04-20 NOTE — Telephone Encounter (Signed)
Faxed hardcopy for zolpidem to [facility]

## 2014-04-20 NOTE — Assessment & Plan Note (Signed)
stable overall by history and exam, recent data reviewed with pt, and pt to continue medical treatment as before,  to f/u any worsening symptoms or concerns BP Readings from Last 3 Encounters:  04/19/14 120/82  12/07/13 128/88  06/10/13 142/82

## 2014-06-09 ENCOUNTER — Ambulatory Visit (INDEPENDENT_AMBULATORY_CARE_PROVIDER_SITE_OTHER): Payer: Medicare Other | Admitting: Internal Medicine

## 2014-06-09 ENCOUNTER — Encounter: Payer: Self-pay | Admitting: Internal Medicine

## 2014-06-09 VITALS — BP 130/84 | HR 72 | Temp 97.9°F | Ht 61.0 in | Wt 173.5 lb

## 2014-06-09 DIAGNOSIS — F329 Major depressive disorder, single episode, unspecified: Secondary | ICD-10-CM

## 2014-06-09 DIAGNOSIS — M797 Fibromyalgia: Secondary | ICD-10-CM

## 2014-06-09 DIAGNOSIS — F32A Depression, unspecified: Secondary | ICD-10-CM

## 2014-06-09 DIAGNOSIS — Z23 Encounter for immunization: Secondary | ICD-10-CM

## 2014-06-09 DIAGNOSIS — Z0189 Encounter for other specified special examinations: Secondary | ICD-10-CM

## 2014-06-09 DIAGNOSIS — I10 Essential (primary) hypertension: Secondary | ICD-10-CM

## 2014-06-09 DIAGNOSIS — Z0000 Encounter for general adult medical examination without abnormal findings: Secondary | ICD-10-CM

## 2014-06-09 DIAGNOSIS — J309 Allergic rhinitis, unspecified: Secondary | ICD-10-CM

## 2014-06-09 MED ORDER — HYDROCHLOROTHIAZIDE 12.5 MG PO TABS: 12.5000 mg | ORAL_TABLET | Freq: Every day | ORAL | Status: DC

## 2014-06-09 MED ORDER — TIZANIDINE HCL 4 MG PO TABS: 4.0000 mg | ORAL_TABLET | Freq: Three times a day (TID) | ORAL | Status: AC | PRN

## 2014-06-09 MED ORDER — ALPRAZOLAM 0.5 MG PO TABS: 0.5000 mg | ORAL_TABLET | Freq: Two times a day (BID) | ORAL | Status: DC | PRN

## 2014-06-09 MED ORDER — FAMOTIDINE 20 MG PO TABS: 20.0000 mg | ORAL_TABLET | Freq: Two times a day (BID) | ORAL | Status: DC

## 2014-06-09 MED ORDER — CETIRIZINE HCL 10 MG PO TABS: 10.0000 mg | ORAL_TABLET | Freq: Every day | ORAL | Status: DC

## 2014-06-09 MED ORDER — ATORVASTATIN CALCIUM 20 MG PO TABS: 20.0000 mg | ORAL_TABLET | Freq: Every day | ORAL | Status: DC

## 2014-06-09 NOTE — Progress Notes (Signed)
Subjective:    Patient ID: Belinda Mcdowell, female    DOB: 1940-08-05, 74 y.o.   MRN: [id number]  HPI  Here to f/u; overall doing ok,  Pt denies chest pain, increased sob or doe, wheezing, orthopnea, PND, increased LE swelling, palpitations, dizziness or syncope.  Pt denies polydipsia, polyuria, or low sugar symptoms such as weakness or confusion improved with po intake.  Pt denies new neurological symptoms such as new headache, or facial or extremity weakness or numbness.   Pt states overall good compliance with meds, has been trying to follow lower cholesterol diet, with wt overall stable. Denies worsening depressive symptoms, suicidal ideation, or panic; has ongoing anxiety, not increased recently., needs xanax refill. Denies urinary symptoms such as dysuria, frequency, urgency, flank pain, hematuria or n/v, fever, chills. S/ps 2 UTi last yr. Asks for incr tizanidine to bid prn for muscle spasms.  Zyrtec really helped for allergies after last visit. Past Medical History  Diagnosis Date  . Generalized headaches   . UTI (lower urinary tract infection)   . Allergic rhinitis, cause unspecified   . Arthritis   . Depression   . Insomnia   . Hyperlipidemia   . Anxiety   . Non-Hodgkin lymphoma 11/18/2011    Stage 3  . PVC's (premature ventricular contractions)     by holter  previously thought to be A. fib  . Fibromyalgia 11/18/2011  . Mucous retention cyst of maxillary sinus 11/18/2011    Right   . Psoriasis 11/18/2011    On MTX  . Anemia, unspecified 11/18/2011    Prior on aranesp  . PUD (peptic ulcer disease) 11/18/2011    Duodenal at age 66 and 72  . Degenerative joint disease 11/18/2011    diffuse  . Lumbar disc disease 11/18/2011  . Sinus bradycardia    Past Surgical History  Procedure Laterality Date  . Appendectomy    . Abdominal hysterectomy      for dysfunctional bleeding    reports that she has quit smoking. Her smoking use included Cigarettes. She has a 3.75 pack-year smoking  history. She has never used smokeless tobacco. She reports that she does not drink alcohol or use illicit drugs. family history includes Arthritis in her other. No Known Allergies Current Outpatient Prescriptions on File Prior to Visit  Medication Sig Dispense Refill  . methotrexate (RHEUMATREX) 2.5 MG tablet Take 7.5 mg by mouth once a week.    Marland Kitchen PARoxetine (PAXIL) 20 MG tablet Take 1 tablet (20 mg total) by mouth every morning. 90 tablet 3  . zolpidem (AMBIEN) 10 MG tablet TAKE 1 TABLET BY MOUTH EVERY NIGHT AT BEDTIME AS NEEDED 30 tablet 5   No current facility-administered medications on file prior to visit.   Review of Systems  Constitutional: Negative for unusual diaphoresis or other sweats  HENT: Negative for ringing in ear Eyes: Negative for double vision or worsening visual disturbance.  Respiratory: Negative for choking and stridor.   Gastrointestinal: Negative for vomiting or other signifcant bowel change Genitourinary: Negative for hematuria or decreased urine volume.  Musculoskeletal: Negative for other MSK pain or swelling Skin: Negative for color change and worsening wound.  Neurological: Negative for tremors and numbness other than noted  Psychiatric/Behavioral: Negative for decreased concentration or agitation other than above       Objective:   Physical Exam BP 130/84 mmHg  Pulse 72  Temp(Src) 97.9 F (36.6 C) (Oral)  Ht 5\' 1"  (1.549 m)  Wt 173 lb 8 oz (78.699  kg)  BMI 32.80 kg/m2  SpO2 95% VS noted,  Constitutional: Pt appears well-developed, well-nourished.  HENT: Head: NCAT.  Right Ear: External ear normal.  Left Ear: External ear normal.  Eyes: . Pupils are equal, round, and reactive to light. Conjunctivae and EOM are normal Neck: Normal range of motion. Neck supple.  Cardiovascular: Normal rate and regular rhythm.   Pulmonary/Chest: Effort normal and breath sounds without rales or wheezing.  Abd:  Soft, NT, ND, + BS Neurological: Pt is alert. Not  confused , motor grossly intact Skin: Skin is warm. No rash Psychiatric: Pt behavior is normal. No agitation. not depressed affect    Assessment & Plan:

## 2014-06-09 NOTE — Patient Instructions (Addendum)
You had the tetanus (Td) shot today  Please continue all other medications as before, and refills have been done if requested - xanax  Please have the pharmacy call with any other refills you may need.  Please continue your efforts at being more active, low cholesterol diet, and weight control..  Please keep your appointments with your specialists as you may have planned  Please return in 6 months, or sooner if needed, with Lab testing done 3-5 days before

## 2014-06-09 NOTE — Progress Notes (Signed)
Pre visit review using our clinic review tool, if applicable. No additional management support is needed unless otherwise documented below in the visit note. 

## 2014-06-12 NOTE — Assessment & Plan Note (Signed)
With recurrent muscle spasms, Ok for tizanidine prn,  to f/u any worsening symptoms or concerns

## 2014-06-12 NOTE — Assessment & Plan Note (Signed)
stable overall by history and exam, recent data reviewed with pt, and pt to continue medical treatment as before,  to f/u any worsening symptoms or concerns BP Readings from Last 3 Encounters:  06/09/14 130/84  04/19/14 120/82  12/07/13 128/88

## 2014-06-12 NOTE — Assessment & Plan Note (Signed)
stable overall by history and exam, recent data reviewed with pt, and pt to continue medical treatment as before,  to f/u any worsening symptoms or concerns Lab Results  Component Value Date   WBC 6.7 12/08/2013   HGB 13.1 12/08/2013   HCT 38.8 12/08/2013   PLT 167.0 12/08/2013   GLUCOSE 93 12/08/2013   CHOL 163 12/08/2013   TRIG 250.0* 12/08/2013   HDL 52.60 12/08/2013   LDLDIRECT 148.7 06/30/2012   LDLCALC 60 12/08/2013   ALT 33 12/08/2013   AST 27 12/08/2013   NA 138 12/08/2013   K 4.1 12/08/2013   CL 101 12/08/2013   CREATININE 0.6 12/08/2013   BUN 17 12/08/2013   CO2 35* 12/08/2013   TSH 1.41 12/08/2013

## 2014-06-12 NOTE — Assessment & Plan Note (Signed)
Improved, cont zyrtec prn,  to f/u any worsening symptoms or concerns

## 2014-07-29 ENCOUNTER — Telehealth: Payer: Self-pay | Admitting: Internal Medicine

## 2014-07-29 NOTE — Telephone Encounter (Signed)
[employer] states that they sent a non formulary form for alprazolam. I have verified that we do not have at the front. Asked that she fax once more. Please call [employer] re non formulary form.

## 2014-07-29 NOTE — Telephone Encounter (Signed)
Form received and given to doctor.

## 2014-08-04 ENCOUNTER — Telehealth: Payer: Self-pay | Admitting: Internal Medicine

## 2014-08-04 NOTE — Telephone Encounter (Signed)
Belinda Mcdowell is calling re: patient appeal for denial of covg for alprazolam. She has questions on other meds that the patient has tried and failed. Please contact beth to discuss

## 2014-08-04 NOTE — Telephone Encounter (Signed)
She is on paxil 20 qd  I am not aware of other meds previously tried  We are ultimately not responsible for a patients drug coverage by insurance, as this is between the pt and the insurance

## 2014-08-04 NOTE — Telephone Encounter (Signed)
Not familiar with this patient. Do you know if any medications that she has tried before being prescribed the xanax?

## 2014-08-10 ENCOUNTER — Other Ambulatory Visit: Payer: Self-pay

## 2014-08-10 DIAGNOSIS — Z1231 Encounter for screening mammogram for malignant neoplasm of breast: Secondary | ICD-10-CM

## 2014-08-23 ENCOUNTER — Telehealth: Payer: Self-pay

## 2014-08-23 NOTE — Telephone Encounter (Signed)
Needs paperwork sent to [facility] in regards to her xanax PA.

## 2014-08-30 NOTE — Telephone Encounter (Signed)
Could you please check on the refill for this. She has been denied this medication and called in on this issue almost a month ago. She says she has been taking this for 30 years and cannot take anything else cause of the side effects. ALPRAZolam (XANAX) 0.5 MG tablet [[id number].

## 2014-09-01 ENCOUNTER — Ambulatory Visit
Admission: RE | Admit: 2014-09-01 | Discharge: 2014-09-01 | Disposition: A | Discharge status: Home / Self Care | Payer: Medicare Other | Source: Ambulatory Visit

## 2014-09-01 DIAGNOSIS — Z1231 Encounter for screening mammogram for malignant neoplasm of breast: Secondary | ICD-10-CM

## 2014-09-06 ENCOUNTER — Telehealth: Payer: Self-pay | Admitting: Internal Medicine

## 2014-09-06 MED ORDER — ALPRAZOLAM 0.5 MG PO TABS: 0.5000 mg | ORAL_TABLET | Freq: Two times a day (BID) | ORAL | Status: DC | PRN

## 2014-09-06 NOTE — Telephone Encounter (Signed)
Pt request refill for ALPRAZolam (XANAX) 0.5 MG tablet to be send into [facility] on [address], she will pay out of pock ate over there.

## 2014-09-06 NOTE — Telephone Encounter (Signed)
Done hardcopy to cherina 

## 2014-09-07 NOTE — Telephone Encounter (Signed)
Rx sent 

## 2014-09-13 ENCOUNTER — Telehealth: Payer: Self-pay | Admitting: Internal Medicine

## 2014-09-13 NOTE — Telephone Encounter (Signed)
Patient is requesting a refill for ALPRAZolam (XANAX) 0.5 MG tablet [[id number]. Her [facility] is using [facility] [address]. Phone [phone]. She is also requesting if Dr. Jenny Reichmann can accept her daughter and grandson as new patients.

## 2014-09-14 NOTE — Telephone Encounter (Signed)
This was addressed apr 12 already, unless the rx did not print it was given to cherina  I am unable to accept the daughter and grandson as patients at this time, as my patient panel is "full" so that I will not be able to give the approp care if I take too many patients

## 2014-09-14 NOTE — Telephone Encounter (Signed)
LMOVM explaining we faxed prescription to [facility] and that Dr. Jenny Reichmann can not accept new patients

## 2014-09-27 ENCOUNTER — Telehealth: Payer: Self-pay

## 2014-09-27 NOTE — Telephone Encounter (Signed)
Patient called to educate on Medicare Wellness apt. LVM for the patient to call back to educate and schedule for wellness visit.   

## 2014-09-29 NOTE — Telephone Encounter (Signed)
Called the patient back and stated she would come in for her [place] and she will call back with her schedule and make an apt.

## 2014-09-30 NOTE — Telephone Encounter (Signed)
Called the patient back and scheduled apt for 5/10 at 3:15pm

## 2014-10-04 ENCOUNTER — Telehealth: Payer: Self-pay

## 2014-10-04 ENCOUNTER — Ambulatory Visit (INDEPENDENT_AMBULATORY_CARE_PROVIDER_SITE_OTHER): Payer: Medicare Other

## 2014-10-04 VITALS — BP 160/100 | Ht 62.0 in | Wt 172.2 lb

## 2014-10-04 DIAGNOSIS — Z0000 Encounter for general adult medical examination without abnormal findings: Secondary | ICD-10-CM | POA: Diagnosis not present

## 2014-10-04 NOTE — Patient Instructions (Addendum)
[name] , Thank you for taking time to come for your Medicare Wellness Visit. I appreciate your ongoing commitment to your health goals. Please review the following plan we discussed and let me know if I can assist you in the future.  The patient agrees to: to check BP three times a week and will  re-start BP medicine and will come back to the office in 2 weeks for BP check  Will schedule dexa scan today prior to leaving  Advanced directive: to bring Living will to the office to scan to chart  Will schedule fup with Dr. Jenny Reichmann if BP remains elevated.    These are the goals we discussed: Goals    None      This is a list of the screening recommended for you and due dates:  Health Maintenance  Topic Date Due  . DEXA scan (bone density measurement)  12/04/2004  . Pneumonia vaccines (2 of 2 - PPSV23) 06/10/2014  . Flu Shot  12/26/2014  . Mammogram  08/31/2016  . Colon Cancer Screening  05/27/2018  . Tetanus Vaccine  06/09/2024  . Shingles Vaccine  Addressed      Health Maintenance Adopting a healthy lifestyle and getting preventive care can go a long way to promote health and wellness. Talk with your health care provider about what schedule of regular examinations is right for you. This is a good chance for you to check in with your provider about disease prevention and staying healthy. In between checkups, there are plenty of things you can do on your own. Experts have done a lot of research about which lifestyle changes and preventive measures are most likely to keep you healthy. Ask your health care provider for more information. WEIGHT AND DIET  Eat a healthy diet  Be sure to include plenty of vegetables, fruits, low-fat dairy products, and lean protein.  Do not eat a lot of foods high in solid fats, added sugars, or salt.  Get regular exercise. This is one of the most important things you can do for your health.  Most adults should exercise for at least 150 minutes  each week. The exercise should increase your heart rate and make you sweat (moderate-intensity exercise).  Most adults should also do strengthening exercises at least twice a week. This is in addition to the moderate-intensity exercise.  Maintain a healthy weight  Body mass index (BMI) is a measurement that can be used to identify possible weight problems. It estimates body fat based on height and weight. Your health care provider can help determine your BMI and help you achieve or maintain a healthy weight.  For females 53 years of age and older:   A BMI below 18.5 is considered underweight.  A BMI of 18.5 to 24.9 is normal.  A BMI of 25 to 29.9 is considered overweight.  A BMI of 30 and above is considered obese.  Watch levels of cholesterol and blood lipids  You should start having your blood tested for lipids and cholesterol at 75 years of age, then have this test every 5 years.  You may need to have your cholesterol levels checked more often if:  Your lipid or cholesterol levels are high.  You are older than 75 years of age.  You are at high risk for heart disease.  CANCER SCREENING   Lung Cancer  Lung cancer screening is recommended for adults 59-53 years old who are at high risk for lung cancer because of a history  of smoking.  A yearly low-dose CT scan of the lungs is recommended for people who:  Currently smoke.  Have quit within the past 15 years.  Have at least a 30-pack-year history of smoking. A pack year is smoking an average of one pack of cigarettes a day for 1 year.  Yearly screening should continue until it has been 15 years since you quit.  Yearly screening should stop if you develop a health problem that would prevent you from having lung cancer treatment.  Breast Cancer  Practice breast self-awareness. This means understanding how your breasts normally appear and feel.  It also means doing regular breast self-exams. Let your health care  provider know about any changes, no matter how small.  If you are in your 20s or 30s, you should have a clinical breast exam (CBE) by a health care provider every 1-3 years as part of a regular health exam.  If you are 52 or older, have a CBE every year. Also consider having a breast X-ray (mammogram) every year.  If you have a family history of breast cancer, talk to your health care provider about genetic screening.  If you are at high risk for breast cancer, talk to your health care provider about having an MRI and a mammogram every year.  Breast cancer gene (BRCA) assessment is recommended for women who have family members with BRCA-related cancers. BRCA-related cancers include:  Breast.  Ovarian.  Tubal.  Peritoneal cancers.  Results of the assessment will determine the need for genetic counseling and BRCA1 and BRCA2 testing. Cervical Cancer Routine pelvic examinations to screen for cervical cancer are no longer recommended for nonpregnant women who are considered low risk for cancer of the pelvic organs (ovaries, uterus, and vagina) and who do not have symptoms. A pelvic examination may be necessary if you have symptoms including those associated with pelvic infections. Ask your health care provider if a screening pelvic exam is right for you.   The Pap test is the screening test for cervical cancer for women who are considered at risk.  If you had a hysterectomy for a problem that was not cancer or a condition that could lead to cancer, then you no longer need Pap tests.  If you are older than 65 years, and you have had normal Pap tests for the past 10 years, you no longer need to have Pap tests.  If you have had past treatment for cervical cancer or a condition that could lead to cancer, you need Pap tests and screening for cancer for at least 20 years after your treatment.  If you no longer get a Pap test, assess your risk factors if they change (such as having a new sexual  partner). This can affect whether you should start being screened again.  Some women have medical problems that increase their chance of getting cervical cancer. If this is the case for you, your health care provider may recommend more frequent screening and Pap tests.  The human papillomavirus (HPV) test is another test that may be used for cervical cancer screening. The HPV test looks for the virus that can cause cell changes in the cervix. The cells collected during the Pap test can be tested for HPV.  The HPV test can be used to screen women 38 years of age and older. Getting tested for HPV can extend the interval between normal Pap tests from three to five years.  An HPV test also should be used to screen  women of any age who have unclear Pap test results.  After 75 years of age, women should have HPV testing as often as Pap tests.  Colorectal Cancer  This type of cancer can be detected and often prevented.  Routine colorectal cancer screening usually begins at 75 years of age and continues through 75 years of age.  Your health care provider may recommend screening at an earlier age if you have risk factors for colon cancer.  Your health care provider may also recommend using home test kits to check for hidden blood in the stool.  A small camera at the end of a tube can be used to examine your colon directly (sigmoidoscopy or colonoscopy). This is done to check for the earliest forms of colorectal cancer.  Routine screening usually begins at age 76.  Direct examination of the colon should be repeated every 5-10 years through 75 years of age. However, you may need to be screened more often if early forms of precancerous polyps or small growths are found. Skin Cancer  Check your skin from head to toe regularly.  Tell your health care provider about any new moles or changes in moles, especially if there is a change in a mole's shape or color.  Also tell your health care provider if  you have a mole that is larger than the size of a pencil eraser.  Always use sunscreen. Apply sunscreen liberally and repeatedly throughout the day.  Protect yourself by wearing long sleeves, pants, a wide-brimmed hat, and sunglasses whenever you are outside. HEART DISEASE, DIABETES, AND HIGH BLOOD PRESSURE   Have your blood pressure checked at least every 1-2 years. High blood pressure causes heart disease and increases the risk of stroke.  If you are between 27 years and 19 years old, ask your health care provider if you should take aspirin to prevent strokes.  Have regular diabetes screenings. This involves taking a blood sample to check your fasting blood sugar level.  If you are at a normal weight and have a low risk for diabetes, have this test once every three years after 75 years of age.  If you are overweight and have a high risk for diabetes, consider being tested at a younger age or more often. PREVENTING INFECTION  Hepatitis B  If you have a higher risk for hepatitis B, you should be screened for this virus. You are considered at high risk for hepatitis B if:  You were born in a country where hepatitis B is common. Ask your health care provider which countries are considered high risk.  Your parents were born in a high-risk country, and you have not been immunized against hepatitis B (hepatitis B vaccine).  You have HIV or AIDS.  You use needles to inject street drugs.  You live with someone who has hepatitis B.  You have had sex with someone who has hepatitis B.  You get hemodialysis treatment.  You take certain medicines for conditions, including cancer, organ transplantation, and autoimmune conditions. Hepatitis C  Blood testing is recommended for:  Everyone born from [birth year] through 1966.  Anyone with known risk factors for hepatitis C. Sexually transmitted infections (STIs)  You should be screened for sexually transmitted infections (STIs) including  gonorrhea and chlamydia if:  You are sexually active and are younger than 75 years of age.  You are older than 75 years of age and your health care provider tells you that you are at risk for this type of infection.  Your sexual activity has changed since you were last screened and you are at an increased risk for chlamydia or gonorrhea. Ask your health care provider if you are at risk.  If you do not have HIV, but are at risk, it may be recommended that you take a prescription medicine daily to prevent HIV infection. This is called pre-exposure prophylaxis (PrEP). You are considered at risk if:  You are sexually active and do not regularly use condoms or know the HIV status of your partner(s).  You take drugs by injection.  You are sexually active with a partner who has HIV. Talk with your health care provider about whether you are at high risk of being infected with HIV. If you choose to begin PrEP, you should first be tested for HIV. You should then be tested every 3 months for as long as you are taking PrEP.  PREGNANCY   If you are premenopausal and you may become pregnant, ask your health care provider about preconception counseling.  If you may become pregnant, take 400 to 800 micrograms (mcg) of folic acid every day.  If you want to prevent pregnancy, talk to your health care provider about birth control (contraception). OSTEOPOROSIS AND MENOPAUSE   Osteoporosis is a disease in which the bones lose minerals and strength with aging. This can result in serious bone fractures. Your risk for osteoporosis can be identified using a bone density scan.  If you are 45 years of age or older, or if you are at risk for osteoporosis and fractures, ask your health care provider if you should be screened.  Ask your health care provider whether you should take a calcium or vitamin D supplement to lower your risk for osteoporosis.  Menopause may have certain physical symptoms and  risks.  Hormone replacement therapy may reduce some of these symptoms and risks. Talk to your health care provider about whether hormone replacement therapy is right for you.  HOME CARE INSTRUCTIONS   Schedule regular health, dental, and eye exams.  Stay current with your immunizations.   Do not use any tobacco products including cigarettes, chewing tobacco, or electronic cigarettes.  If you are pregnant, do not drink alcohol.  If you are breastfeeding, limit how much and how often you drink alcohol.  Limit alcohol intake to no more than 1 drink per day for nonpregnant women. One drink equals 12 ounces of beer, 5 ounces of wine, or 1 ounces of hard liquor.  Do not use street drugs.  Do not share needles.  Ask your health care provider for help if you need support or information about quitting drugs.  Tell your health care provider if you often feel depressed.  Tell your health care provider if you have ever been abused or do not feel safe at home. Document Released: 11/26/2010 Document Revised: 09/27/2013 Document Reviewed: 04/14/2013 [facility] Patient Information 2015 [place], [place]. This information is not intended to replace advice given to you by your health care provider. Make sure you discuss any questions you have with your health care provider.

## 2014-10-04 NOTE — Progress Notes (Signed)
Subjective:   Belinda Mcdowell is a 74 y.o. female who presents for Medicare Annual (Subsequent) preventive examination.  Review of Systems: Reviewed the problem list HRA assessment completed during visit  Health better, the same or worse than last year? same  Health described as excellent; very good; good; fair or poor? Very good  Current Exercise/ exercises x 4 q week at the [facility]; does aerobic x 2 days and yoga x 2 days  Current dietary: Eats at home; eats healthy; salad, beans; earthy things; no sugar; no salt  Psychosocial changes in the last year; moves; losses of family;  Loss cat of 18 years recently and this has impacted her but better now  Vision: Vision up to date; keep up with check up  Dental: Will start this year;  Can go to [facility] can clean teeth if needed  Generalized Safety in the home reviewed  Review Firearm safety as appropriate;  Assessed for community safety Emergency Plan for illness or other  Driving  Sun protection   Current Care Team reviewed and updated    Cardiac Risk Factors include: advanced age (>54men, >19 women);dyslipidemia;hypertension;obesity (BMI >30kg/m2)   Hx of a fib; the last time she went to Dr. Caryl Comes, he stopped her medicine. Continues on Lipitor/  Will monitor BP     Objective:     Vitals: BP 160/100 mmHg  Ht 5\' 2"  (1.575 m)  Wt 172 lb 4 oz (78.132 kg)  BMI 31.50 kg/m2  Tobacco History  Smoking status  . Former Smoker -- 0.25 packs/day for 15 years  . Types: Cigarettes  Smokeless tobacco  . Never Used    Comment: smoked before 20's      Counseling given: Yes   Past Medical History  Diagnosis Date  . Generalized headaches   . UTI (lower urinary tract infection)   . Allergic rhinitis, cause unspecified   . Arthritis   . Depression   . Insomnia   . Hyperlipidemia   . Anxiety   . Non-Hodgkin lymphoma 11/18/2011    Stage 3  . PVC's (premature ventricular contractions)     by holter  previously  thought to be A. fib  . Fibromyalgia 11/18/2011  . Mucous retention cyst of maxillary sinus 11/18/2011    Right   . Psoriasis 11/18/2011    On MTX  . Anemia, unspecified 11/18/2011    Prior on aranesp  . PUD (peptic ulcer disease) 11/18/2011    Duodenal at age 37 and 16  . Degenerative joint disease 11/18/2011    diffuse  . Lumbar disc disease 11/18/2011  . Sinus bradycardia   . Allergy     states she has environmental allergies; and takes zyrtec   Past Surgical History  Procedure Laterality Date  . Appendectomy    . Abdominal hysterectomy      for dysfunctional bleeding   Family History  Problem Relation Age of Onset  . Arthritis Other   . Arthritis Mother   . Arthritis Sister   . Hypertension Sister    History  Sexual Activity  . Sexual Activity: Not on file    Outpatient Encounter Prescriptions as of 10/04/2014  Medication Sig  . ALPRAZolam (XANAX) 0.5 MG tablet Take 1 tablet (0.5 mg total) by mouth 2 (two) times daily as needed for anxiety.  Marland Kitchen atorvastatin (LIPITOR) 20 MG tablet Take 1 tablet (20 mg total) by mouth daily.  . cetirizine (ZYRTEC) 10 MG tablet Take 1 tablet (10 mg total) by mouth  daily.  . famotidine (PEPCID) 20 MG tablet Take 1 tablet (20 mg total) by mouth 2 (two) times daily.  . methotrexate (RHEUMATREX) 2.5 MG tablet Take 7.5 mg by mouth once a week.  Marland Kitchen PARoxetine (PAXIL) 20 MG tablet Take 1 tablet (20 mg total) by mouth every morning.  Marland Kitchen tiZANidine (ZANAFLEX) 4 MG tablet Take 1 tablet (4 mg total) by mouth every 8 (eight) hours as needed.  . zolpidem (AMBIEN) 10 MG tablet TAKE 1 TABLET BY MOUTH EVERY NIGHT AT BEDTIME AS NEEDED  . hydrochlorothiazide (HYDRODIURIL) 12.5 MG tablet Take 1 tablet (12.5 mg total) by mouth daily. (Patient not taking: Reported on 10/04/2014)   No facility-administered encounter medications on file as of 10/04/2014.    Activities of Daily Living In your present state of health, do you have any difficulty performing the following  activities: 10/04/2014  Hearing? N  Vision? N  Difficulty concentrating or making decisions? N  Walking or climbing stairs? Y  Dressing or bathing? N  Doing errands, shopping? N  Preparing Food and eating ? N  Using the Toilet? N  In the past six months, have you accidently leaked urine? N  Do you have problems with loss of bowel control? N  Managing your Medications? N  Managing your Finances? N  Housekeeping or managing your Housekeeping? N    Patient Care Team: Biagio Borg, MD as PCP - General (Internal Medicine) Concha Norway, MD as Consulting Physician (Internal Medicine) Unice Bailey, MD as Consulting Physician (Rheumatology)    Assessment:    BP elevated and will restart BP medicine and come back in 2 weeks for a bp check  Pulse 64/ Oxygen 94 Understand risk and lifestyle choices than can impede health/ great understanding   Personalized Education given regarding: regarding bp  Pt determined a personalized goal to continue exercise    Fall Risk and general Safety reviewed Assess fear of falling? Yes sometimes; because her family has been through many falls. She takes special measures to stay safe  Educated on prevention falls; Exercise, toning and strengthening; Balance exercises  Comfortable shoes Regular vision checks; reviewed necessity of annual visits Home safety; removal of throw rugs; bathroom handrails;  Smoke detectors; safe environment; avoid climbing ladders/ step stools on BP medication  Bone density scan as appropriate/ will order Calcium and Vit D as appropriate/ Takes 1000; Vit D 2000 u of d every day  Reviewed for medication compliance and barriers; stopped taking BP med because she got tired of having to void all the time but has agreed to go back on medication   Stress: Recommendations for managing stress if assessed as a factor;  Stress level is 3; Deal with Bills    Risk for hepatitis or high risk social behavior via hepatitis screen/ No  issues identified    Risk for Depression evaluated; patient understand how to care for self and continues to take medicine   Safety assessed (driving issues; vision; home environment; support and environmental safety)  Cognition assessed by AD8; Score 0 (A score of 2 or greater would indicate the MMSE be completed)    Need for Immunizations or other screenings identified;  (CDC recommmend Prevnar at 65 followed by pnuemovax 23 in one year or 5 years after the last dose.  Health Maintenance up to date and a Preventive Wellness Plan was given to the patient  Exercise Activities and Dietary recommendations Time (Minutes): 60, Frequency (Times/Week): 4, Weekly Exercise (Minutes/Week): 240, Intensity: Moderate  Goals  None     Fall Risk Fall Risk  10/04/2014 12/08/2012  Falls in the past year? Yes No  Number falls in past yr: 1 -  Injury with Fall? Yes -   Depression Screen PHQ 2/9 Scores 10/04/2014 12/08/2012  PHQ - 2 Score 0 0  Exception Documentation (No Data) -     Cognitive Testing MMSE - Mini Mental State Exam 10/04/2014  Not completed: Unable to complete    Immunization History  Administered Date(s) Administered  . Influenza Split 02/27/2012  . Influenza,inj,Quad PF,36+ Mos 02/25/2013, 03/17/2014  . Pneumococcal Conjugate-13 10/26/2003, 06/10/2013  . Pneumococcal Polysaccharide-23 05/27/2004  . Td 06/09/2014  . Tdap 10/25/2004  . Zoster 01/26/2003   Screening Tests Health Maintenance  Topic Date Due  . DEXA SCAN  12/04/2004  . PNA vac Low Risk Adult (2 of 2 - PPSV23) 06/10/2014  . INFLUENZA VACCINE  12/26/2014  . MAMMOGRAM  08/31/2016  . COLONOSCOPY  05/27/2018  . TETANUS/TDAP  06/09/2024  . ZOSTAVAX  Addressed      Plan:  Plan   The patient agrees to: to check BP tiw and re-start BP medicine and will come back to the office in 2 weeks for BP check   Advanced directive: to bring Living will to the office to scan to chart      During the  course of the visit the patient was educated and counseled about the following appropriate screening and preventive services:   Vaccines to include Pneumoccal, Influenza, Hepatitis B, Td, Zostavax, HCV  Was told she had all pneumonia vaccinations and did not need any more.  Tetanus upt to date  Electrocardiogram; done  Cardiovascular Disease; discussed risk , high bp   Colorectal cancer screening; completed  Bone density screening to be done; will schedule today  Diabetes screening; BS 92  Glaucoma screening; will schedule annual eye exam  Mammography/PAP/ Mammogram completed;   Nutrition counseling   Patient Instructions (the written plan) was given to the patient.   VVKPQ,AESLP, RN  10/04/2014   I agree and attest to the above information regarding wellness and prevention  Cathlean Cower MD

## 2014-10-18 ENCOUNTER — Encounter: Payer: Self-pay | Admitting: *Deleted

## 2014-10-18 ENCOUNTER — Ambulatory Visit (INDEPENDENT_AMBULATORY_CARE_PROVIDER_SITE_OTHER)
Admission: RE | Admit: 2014-10-18 | Discharge: 2014-10-18 | Disposition: A | Discharge status: Home / Self Care | Payer: Medicare Other | Source: Ambulatory Visit | Attending: Internal Medicine | Admitting: Internal Medicine

## 2014-10-18 ENCOUNTER — Ambulatory Visit (INDEPENDENT_AMBULATORY_CARE_PROVIDER_SITE_OTHER): Payer: Medicare Other | Admitting: *Deleted

## 2014-10-18 VITALS — BP 134/86

## 2014-10-18 DIAGNOSIS — I10 Essential (primary) hypertension: Secondary | ICD-10-CM | POA: Diagnosis not present

## 2014-10-18 DIAGNOSIS — Z13820 Encounter for screening for osteoporosis: Secondary | ICD-10-CM | POA: Diagnosis not present

## 2014-10-18 DIAGNOSIS — Z0000 Encounter for general adult medical examination without abnormal findings: Secondary | ICD-10-CM

## 2014-10-25 NOTE — Progress Notes (Signed)
Medical screening examination/treatment/procedure(s) were performed by non-physician practitioner and as supervising physician I was immediately available for consultation/collaboration. I agree with above Continue same tx.Cathlean Cower, MD

## 2014-10-28 ENCOUNTER — Other Ambulatory Visit: Payer: Self-pay

## 2014-10-28 ENCOUNTER — Other Ambulatory Visit: Payer: Self-pay | Admitting: Internal Medicine

## 2014-10-28 MED ORDER — DOXEPIN HCL 3 MG PO TABS: 3.0000 mg | ORAL_TABLET | Freq: Every evening | ORAL | Status: DC | PRN

## 2014-10-28 NOTE — Telephone Encounter (Signed)
PA is required for Ambien. Pt says she has taken her last tablet and she will be unable to sleep without it, however she is willing to try something else since the PA has a 24-48 hr response time. Per pt's insurance, Silenor (30 tablet/month limit) is covered. Please advise on medication change in PCP's absence, thanks!

## 2014-10-28 NOTE — Telephone Encounter (Signed)
Changed and Rx'd

## 2014-10-28 NOTE — Telephone Encounter (Signed)
Silenor 3 mg qhs prn #30

## 2014-10-28 NOTE — Telephone Encounter (Signed)
74 yo; med on [employer] List #30 NO REFILL

## 2014-10-31 ENCOUNTER — Telehealth: Payer: Self-pay | Admitting: Internal Medicine

## 2014-10-31 NOTE — Telephone Encounter (Signed)
Left message advising patient that PA may take several days or weeks to get from insurance company---since Ambien is too expensive, would patient be willing for dr Jenny Reichmann to order something else that is less expensive and that her insurance would cover right now without prior authorization so that med could be called in quicker-----if patient calls back, need to know if patient is ok with trying less expensive med

## 2014-10-31 NOTE — Telephone Encounter (Signed)
Pt called in and said that could not pick the meds that was called in for sleep because it was way to high.  She said that she wants the zolpidem (AMBIEN) 10 MG tablet [[id number]].   It is requiring a PA and wants [place] to do the PA on this med.  She said that she is not sleeping a needs this medication as soon as possible.  She is also requesting a call back from the nurse with the status

## 2014-10-31 NOTE — Telephone Encounter (Signed)
Pharmacy just called stating patient called them requesting them to call in regards to this.  States Dr. Linna Darner prescribed.

## 2014-10-31 NOTE — Telephone Encounter (Signed)
Patient would like PA process to begin for [place] but she is requesting a script for something else that is not as expensive to be sent to her pharmacy to get her through until then.  Would like script to be sent to [facility].

## 2014-10-31 NOTE — Telephone Encounter (Signed)
Patient wanted to let you know that she wants the generic

## 2014-10-31 NOTE — Telephone Encounter (Signed)
Patient now states she does not want PA processed, she would like something else sent in

## 2014-11-01 ENCOUNTER — Telehealth: Payer: Self-pay

## 2014-11-01 NOTE — Telephone Encounter (Signed)
You can see if insurance  itwill cover the generic; the preauthorization may  have only been for the branded Ambien I had ordered the [place] as per insurance coverage  please ask Dr. Jenny Reichmann what he wants to do about the generic Ambien

## 2014-11-01 NOTE — Telephone Encounter (Signed)
PA was required for Ambien so she requested to be switched to generic Silenor 3mg . This was done 10/28/2014, please advise

## 2014-11-01 NOTE — Telephone Encounter (Signed)
Generic requires PA as well. Pt advised pf same. See other phone regarding sleep medication... This is being addressed by PCP

## 2014-11-01 NOTE — Telephone Encounter (Signed)
Patient is now wanting generic for ambien---please advise, thanks

## 2014-11-01 NOTE — Telephone Encounter (Signed)
I thought #30 of 3 mg ordered last week as Ambien requited PA She is 73 and on xanax,Zyrtec,Doxepin & Zanaflex I defer to Dr Jenny Reichmann as to best regimen

## 2014-11-01 NOTE — Telephone Encounter (Signed)
Pt requesting a refill of Silenor 3 mg tablets. Please advise

## 2014-11-01 NOTE — Telephone Encounter (Signed)
Pt has called several times about this medication.  She said that she does not have any meds to help her sleep.  She is super upset that she has not rec any meds to help her sleep in over a week.  Can you please call this pt and explain to her what is going on?  She is not understanding the PA situation.    Best number [phone]

## 2014-11-01 NOTE — Telephone Encounter (Signed)
Pt states that Silenor is too expensive and she is requesting an alternative medication, please advise

## 2014-11-02 MED ORDER — TRAZODONE HCL 50 MG PO TABS: 25.0000 mg | ORAL_TABLET | Freq: Every evening | ORAL | Status: DC | PRN

## 2014-11-02 NOTE — Telephone Encounter (Signed)
[place] for change to trazodone 50 qhs - done erx

## 2014-11-02 NOTE — Addendum Note (Signed)
Addended by: Biagio Borg on: 11/02/2014 07:13 PM   Modules accepted: Orders

## 2014-11-03 NOTE — Telephone Encounter (Signed)
Patient returned your call.  Please call her.

## 2014-11-03 NOTE — Telephone Encounter (Signed)
Pt advised of medication change. 

## 2014-11-03 NOTE — Telephone Encounter (Signed)
Left message on machine for pt to return my call  

## 2014-11-24 ENCOUNTER — Other Ambulatory Visit: Payer: Self-pay | Admitting: Internal Medicine

## 2014-12-02 ENCOUNTER — Ambulatory Visit (INDEPENDENT_AMBULATORY_CARE_PROVIDER_SITE_OTHER): Payer: Medicare Other | Admitting: Internal Medicine

## 2014-12-02 ENCOUNTER — Other Ambulatory Visit (INDEPENDENT_AMBULATORY_CARE_PROVIDER_SITE_OTHER): Payer: Medicare Other

## 2014-12-02 ENCOUNTER — Ambulatory Visit (INDEPENDENT_AMBULATORY_CARE_PROVIDER_SITE_OTHER)
Admission: RE | Admit: 2014-12-02 | Discharge: 2014-12-02 | Disposition: A | Discharge status: Home / Self Care | Payer: Medicare Other | Source: Ambulatory Visit | Attending: Internal Medicine | Admitting: Internal Medicine

## 2014-12-02 ENCOUNTER — Telehealth: Payer: Self-pay | Admitting: Internal Medicine

## 2014-12-02 ENCOUNTER — Encounter: Payer: Self-pay | Admitting: Internal Medicine

## 2014-12-02 VITALS — BP 128/84 | HR 66 | Temp 98.5°F | Ht 61.0 in | Wt 171.0 lb

## 2014-12-02 DIAGNOSIS — R531 Weakness: Secondary | ICD-10-CM

## 2014-12-02 DIAGNOSIS — G4700 Insomnia, unspecified: Secondary | ICD-10-CM

## 2014-12-02 DIAGNOSIS — K1379 Other lesions of oral mucosa: Secondary | ICD-10-CM

## 2014-12-02 DIAGNOSIS — Z0000 Encounter for general adult medical examination without abnormal findings: Secondary | ICD-10-CM

## 2014-12-02 DIAGNOSIS — I10 Essential (primary) hypertension: Secondary | ICD-10-CM | POA: Diagnosis not present

## 2014-12-02 LAB — URINALYSIS, ROUTINE W REFLEX MICROSCOPIC
Bilirubin Urine: NEGATIVE
Ketones, ur: NEGATIVE
Nitrite: NEGATIVE
PH: 6 (ref 5.0–8.0)
SPECIFIC GRAVITY, URINE: 1.015 (ref 1.000–1.030)
Total Protein, Urine: NEGATIVE
URINE GLUCOSE: NEGATIVE
Urobilinogen, UA: 0.2 (ref 0.0–1.0)

## 2014-12-02 LAB — BASIC METABOLIC PANEL
BUN: 24 mg/dL — ABNORMAL HIGH (ref 6–23)
CALCIUM: 9.9 mg/dL (ref 8.4–10.5)
CO2: 30 mEq/L (ref 19–32)
Chloride: 103 mEq/L (ref 96–112)
Creatinine, Ser: 0.72 mg/dL (ref 0.40–1.20)
GFR: 83.93 mL/min (ref >60.00)
GLUCOSE: 99 mg/dL (ref 70–99)
Potassium: 4.2 mEq/L (ref 3.5–5.1)
SODIUM: 139 meq/L (ref 135–145)

## 2014-12-02 LAB — LIPID PANEL
Cholesterol: 126 mg/dL (ref 0–200)
HDL: 40.3 mg/dL (ref >39.00)
LDL CALC: 55 mg/dL (ref 0–99)
NonHDL: 85.7
Total CHOL/HDL Ratio: 3
Triglycerides: 156 mg/dL — ABNORMAL HIGH (ref 0.0–149.0)
VLDL: 31.2 mg/dL (ref 0.0–40.0)

## 2014-12-02 LAB — CBC WITH DIFFERENTIAL/PLATELET
BASOS ABS: 0 10*3/uL (ref 0.0–0.1)
Basophils Relative: 0.5 % (ref 0.0–3.0)
EOS ABS: 0.3 10*3/uL (ref 0.0–0.7)
Eosinophils Relative: 4.4 % (ref 0.0–5.0)
HCT: 38.8 % (ref 36.0–46.0)
HEMOGLOBIN: 12.9 g/dL (ref 12.0–15.0)
Lymphocytes Relative: 23.5 % (ref 12.0–46.0)
Lymphs Abs: 1.6 10*3/uL (ref 0.7–4.0)
MCHC: 33.3 g/dL (ref 30.0–36.0)
MCV: 88.7 fl (ref 78.0–100.0)
Monocytes Absolute: 0.5 10*3/uL (ref 0.1–1.0)
Monocytes Relative: 6.9 % (ref 3.0–12.0)
NEUTROS ABS: 4.4 10*3/uL (ref 1.4–7.7)
NEUTROS PCT: 64.7 % (ref 43.0–77.0)
Platelets: 170 10*3/uL (ref 150.0–400.0)
RBC: 4.37 Mil/uL (ref 3.87–5.11)
RDW: 15.2 % (ref 11.5–15.5)
WBC: 6.8 10*3/uL (ref 4.0–10.5)

## 2014-12-02 LAB — HEPATIC FUNCTION PANEL
ALK PHOS: 83 U/L (ref 39–117)
ALT: 35 U/L (ref 0–35)
AST: 27 U/L (ref 0–37)
Albumin: 4.4 g/dL (ref 3.5–5.2)
BILIRUBIN DIRECT: 0.2 mg/dL (ref 0.0–0.3)
BILIRUBIN TOTAL: 0.8 mg/dL (ref 0.2–1.2)
Total Protein: 6 g/dL (ref 6.0–8.3)

## 2014-12-02 LAB — TSH: TSH: 1.64 u[IU]/mL (ref 0.35–4.50)

## 2014-12-02 LAB — VITAMIN D 25 HYDROXY (VIT D DEFICIENCY, FRACTURES): VITD: 25.29 ng/mL — ABNORMAL LOW (ref 30.00–100.00)

## 2014-12-02 LAB — VITAMIN B12: Vitamin B-12: 987 pg/mL — ABNORMAL HIGH (ref 211–911)

## 2014-12-02 LAB — SEDIMENTATION RATE: SED RATE: 12 mm/h (ref 0–22)

## 2014-12-02 MED ORDER — CEPHALEXIN 500 MG PO CAPS: 500.0000 mg | ORAL_CAPSULE | Freq: Four times a day (QID) | ORAL | Status: DC

## 2014-12-02 MED ORDER — ALPRAZOLAM 0.5 MG PO TABS: 0.5000 mg | ORAL_TABLET | Freq: Two times a day (BID) | ORAL | Status: AC | PRN

## 2014-12-02 MED ORDER — MAGIC MOUTHWASH: 5.0000 mL | Freq: Three times a day (TID) | ORAL | Status: DC | PRN

## 2014-12-02 NOTE — Assessment & Plan Note (Signed)
Improved, cont current meds 

## 2014-12-02 NOTE — Progress Notes (Signed)
Pre visit review using our clinic review tool, if applicable. No additional management support is needed unless otherwise documented below in the visit note. 

## 2014-12-02 NOTE — Progress Notes (Signed)
Subjective:    Patient ID: Belinda Mcdowell, female    DOB: June 08, 1940, 74 y.o.   MRN: [id number]  HPI    Here for wellness and f/u;  Overall doing ok;  Pt denies Chest pain, worsening SOB, DOE, wheezing, orthopnea, PND, worsening LE edema, palpitations,  or syncope but has recurring dizziness..  Pt denies neurological change such as new headache, facial or extremity weakness.  Pt denies polydipsia, polyuria, or low sugar symptoms. Pt states overall good compliance with treatment and medications, good tolerability, and has been trying to follow appropriate diet.  Pt denies worsening depressive symptoms, suicidal ideation or panic. No fever, night sweats, wt loss, loss of appetite, or other constitutional symptoms.  Pt states good ability with ADL's, has low fall risk, home safety reviewed and adequate, no other significant changes in hearing or vision, and only occasionally active with exercise.  Does have sores in mouth, tx with antifungal that did not work. Recent bone density normal.  Wondering if having UTI b/c have urinary urgency for several days.  Denies urinary symptoms such as dysuria, frequency,, flank pain, hematuria or n/v, fever, chills.  Feels overall weak like she "has the flu" for over 1 month.  Pt denies fever, wt loss, night sweats, loss of appetite, or other constitutional symptoms  Sleeping better on current meds. Past Medical History  Diagnosis Date  . Generalized headaches   . UTI (lower urinary tract infection)   . Allergic rhinitis, cause unspecified   . Arthritis   . Depression   . Insomnia   . Hyperlipidemia   . Anxiety   . Non-Hodgkin lymphoma 11/18/2011    Stage 3  . PVC's (premature ventricular contractions)     by holter  previously thought to be A. fib  . Fibromyalgia 11/18/2011  . Mucous retention cyst of maxillary sinus 11/18/2011    Right   . Psoriasis 11/18/2011    On MTX  . Anemia, unspecified 11/18/2011    Prior on aranesp  . PUD (peptic ulcer disease)  11/18/2011    Duodenal at age 50 and 2  . Degenerative joint disease 11/18/2011    diffuse  . Lumbar disc disease 11/18/2011  . Sinus bradycardia   . Allergy     states she has environmental allergies; and takes zyrtec   Past Surgical History  Procedure Laterality Date  . Appendectomy    . Abdominal hysterectomy      for dysfunctional bleeding    reports that she has quit smoking. Her smoking use included Cigarettes. She has a 3.75 pack-year smoking history. She has never used smokeless tobacco. She reports that she does not drink alcohol or use illicit drugs. family history includes Arthritis in her mother, other, and sister; Hypertension in her sister. No Known Allergies Current Outpatient Prescriptions on File Prior to Visit  Medication Sig Dispense Refill  . ALPRAZolam (XANAX) 0.5 MG tablet Take 1 tablet (0.5 mg total) by mouth 2 (two) times daily as needed for anxiety. 60 tablet 2  . atorvastatin (LIPITOR) 20 MG tablet Take 1 tablet (20 mg total) by mouth daily. 90 tablet 3  . cetirizine (ZYRTEC) 10 MG tablet Take 1 tablet (10 mg total) by mouth daily. 90 tablet 3  . famotidine (PEPCID) 20 MG tablet Take 1 tablet (20 mg total) by mouth 2 (two) times daily. 180 tablet 3  . hydrochlorothiazide (HYDRODIURIL) 12.5 MG tablet Take 1 tablet (12.5 mg total) by mouth daily. 90 tablet 3  . methotrexate (RHEUMATREX) 2.5  MG tablet Take 7.5 mg by mouth once a week.    Marland Kitchen PARoxetine (PAXIL) 20 MG tablet Take 1 tablet (20 mg total) by mouth every morning. 90 tablet 3  . tiZANidine (ZANAFLEX) 4 MG tablet Take 1 tablet (4 mg total) by mouth every 8 (eight) hours as needed. 180 tablet 3  . traZODone (DESYREL) 50 MG tablet Take 0.5-1 tablets (25-50 mg total) by mouth at bedtime as needed for sleep. 30 tablet 5  . Doxepin HCl 3 MG TABS Take 1 tablet (3 mg total) by mouth at bedtime as needed. (Patient not taking: Reported on 12/02/2014) 30 tablet 0   No current facility-administered medications on file  prior to visit.    Review of Systems Constitutional: Negative for increased diaphoresis, other activity, appetite or siginficant weight change other than noted HENT: Negative for worsening hearing loss, ear pain, facial swelling, mouth sores and neck stiffness.   Eyes: Negative for other worsening pain, redness or visual disturbance.  Respiratory: Negative for shortness of breath and wheezing  Cardiovascular: Negative for chest pain and palpitations.  Gastrointestinal: Negative for diarrhea, blood in stool, abdominal distention or other pain Genitourinary: Negative for hematuria, flank pain or change in urine volume.  Musculoskeletal: Negative for myalgias or other joint complaints.  Skin: Negative for color change and wound or drainage.  Neurological: Negative for syncope and numbness. other than noted Hematological: Negative for adenopathy. or other swelling Psychiatric/Behavioral: Negative for hallucinations, SI, self-injury, decreased concentration or other worsening agitation.      Objective:   Physical Exam BP 128/84 mmHg  Pulse 66  Temp(Src) 98.5 F (36.9 C) (Oral)  Ht 5\' 1"  (1.549 m)  Wt 171 lb (77.565 kg)  BMI 32.33 kg/m2  SpO2 97% VS noted,  Constitutional: Pt is oriented to person, place, and time. Appears well-developed and well-nourished, in no significant distress Head: Normocephalic and atraumatic.  Right Ear: External ear normal.  Left Ear: External ear normal.  Nose: Nose normal.  Mouth/Throat: Oropharynx is clear and moist.  Eyes: Conjunctivae and EOM are normal. Pupils are equal, round, and reactive to light.  Neck: Normal range of motion. Neck supple. No JVD present. No tracheal deviation present or significant neck LA or mass Cardiovascular: Normal rate, regular rhythm, normal heart sounds and intact distal pulses.   Pulmonary/Chest: Effort normal and breath sounds without rales or wheezing  Abdominal: Soft. Bowel sounds are normal. NT. No HSM    Musculoskeletal: Normal range of motion. Exhibits no edema.  Lymphadenopathy:  Has no cervical adenopathy.  Neurological: Pt is alert and oriented to person, place, and time. Pt has normal reflexes. No cranial nerve deficit. Motor grossly intact Skin: Skin is warm and dry. No rash noted.  Psychiatric:  Has depressed mood and affect but denies. Behavior is normal.     Assessment & Plan:

## 2014-12-02 NOTE — Assessment & Plan Note (Signed)

## 2014-12-02 NOTE — Telephone Encounter (Signed)
Spoke to pt - + UTI and low Vit d with labs  antibx done erx, and pt to increase her 2000 units vIt d to 4000 units per day

## 2014-12-02 NOTE — Assessment & Plan Note (Signed)
stable overall by history and exam, recent data reviewed with pt, and pt to continue medical treatment as before,  to f/u any worsening symptoms or concerns ble BP Readings from Last 3 Encounters:  12/02/14 128/84  10/18/14 134/86  10/04/14 160/100

## 2014-12-02 NOTE — Assessment & Plan Note (Signed)
Etiology unclear, for cxr as well as spep.  to f/u any worsening symptoms or concerns

## 2014-12-02 NOTE — Assessment & Plan Note (Signed)
Exam benign, will try magic mouthwash prn

## 2014-12-02 NOTE — Addendum Note (Signed)
Addended by: Biagio Borg on: 12/02/2014 03:21 PM   Modules accepted: Orders

## 2014-12-02 NOTE — Patient Instructions (Signed)
Please take all new medication as prescribed  - the magic mouthwash  Please continue all other medications as before, and refills have been done if requested.  Please have the pharmacy call with any other refills you may need.  Please continue your efforts at being more active, low cholesterol diet, and weight control.  You are otherwise up to date with prevention measures today.  Please keep your appointments with your specialists as you may have planned   Please go to the XRAY Department in the Basement (go straight as you get off the elevator) for the x-ray testing  Please go to the LAB in the Basement (turn left off the elevator) for the tests to be done today  You will be contacted by phone if any changes need to be made immediately.  Otherwise, you will receive a letter about your results with an explanation, but please check with MyChart first.  Please remember to sign up for MyChart if you have not done so, as this will be important to you in the future with finding out test results, communicating by private email, and scheduling acute appointments online when needed.  Please return in 6 months, or sooner if needed

## 2014-12-06 ENCOUNTER — Telehealth: Payer: Self-pay | Admitting: Internal Medicine

## 2014-12-06 LAB — PROTEIN ELECTROPHORESIS, SERUM
ALPHA-2-GLOBULIN: 0.7 g/dL (ref 0.5–0.9)
Albumin ELP: 4 g/dL (ref 3.8–4.8)
Alpha-1-Globulin: 0.3 g/dL (ref 0.2–0.3)
Beta 2: 0.1 g/dL — ABNORMAL LOW (ref 0.2–0.5)
Beta Globulin: 0.4 g/dL (ref 0.4–0.6)
Gamma Globulin: 0.2 g/dL — ABNORMAL LOW (ref 0.8–1.7)
Total Protein, Serum Electrophoresis: 5.7 g/dL — ABNORMAL LOW (ref 6.1–8.1)

## 2014-12-06 NOTE — Telephone Encounter (Signed)
[facility] called needing the recipe for the magic mouthwash Please call them back

## 2014-12-07 MED ORDER — MAGIC MOUTHWASH: 5.0000 mL | Freq: Three times a day (TID) | ORAL | Status: DC | PRN

## 2014-12-07 NOTE — Telephone Encounter (Signed)
Please advise in MD's absence, thanks

## 2014-12-07 NOTE — Telephone Encounter (Signed)
Recipe added to Rx, would VAL please print and sign? Thank

## 2014-12-07 NOTE — Telephone Encounter (Signed)
1. 80 mLs viscous lidocaine 2% 2. 80 mLs Mylanta 3. 80 mLs diphenhydramine 12.5 mg per 5 mL elixir 4. 80 mLs nystatin 100,000 unit suspension 5. 80 mLs distilled water

## 2014-12-07 NOTE — Addendum Note (Signed)
Addended by: Lyman Bishop on: 12/07/2014 03:45 PM   Modules accepted: Orders

## 2014-12-08 ENCOUNTER — Ambulatory Visit: Payer: Medicare Other | Admitting: Internal Medicine

## 2014-12-08 MED ORDER — MAGIC MOUTHWASH: 5.0000 mL | Freq: Three times a day (TID) | ORAL | Status: DC | PRN

## 2014-12-08 NOTE — Addendum Note (Signed)
Addended by: Gwendolyn Grant A on: 12/08/2014 08:26 AM   Modules accepted: Orders

## 2014-12-08 NOTE — Telephone Encounter (Signed)
Printed and signed - given to [facility]

## 2014-12-28 ENCOUNTER — Telehealth: Payer: Self-pay | Admitting: Internal Medicine

## 2014-12-28 NOTE — Telephone Encounter (Signed)
Patient stated that coverage was denied for blood draw, because it exceeded the the guidelines patient ask if you can check the dx code and resubmit it.. Please advise

## 2014-12-28 NOTE — Telephone Encounter (Signed)
Pt had several labs drawn  Was there one in particular?

## 2014-12-29 NOTE — Telephone Encounter (Signed)
Left message on machine for pt to return my call  

## 2014-12-29 NOTE — Telephone Encounter (Signed)
Patient stated [employer] need correct procedure codes for labs for 12/03/14.

## 2015-01-03 NOTE — Telephone Encounter (Signed)
Left message on pt's home VM to contact billing

## 2015-01-04 ENCOUNTER — Other Ambulatory Visit: Payer: Self-pay

## 2015-01-04 MED ORDER — PAROXETINE HCL 20 MG PO TABS: 20.0000 mg | ORAL_TABLET | ORAL | Status: DC

## 2015-01-09 NOTE — Telephone Encounter (Signed)
Error; reviewed chart and no encounter

## 2015-01-24 ENCOUNTER — Telehealth: Payer: Self-pay | Admitting: Internal Medicine

## 2015-01-24 NOTE — Telephone Encounter (Signed)
Patient would like to be referred oncology hematology Doctor [facility], please advise

## 2015-01-25 NOTE — Telephone Encounter (Signed)
Pt advised to call back with a reason for request

## 2015-02-08 ENCOUNTER — Telehealth: Payer: Self-pay

## 2015-02-08 DIAGNOSIS — C8590 Non-Hodgkin lymphoma, unspecified, unspecified site: Secondary | ICD-10-CM

## 2015-02-08 NOTE — Telephone Encounter (Signed)
Referral done

## 2015-02-08 NOTE — Telephone Encounter (Signed)
Pt called requesting a referral to Oncology to follow up on history of Non-Hodgkin's Lymphoma Stage 3

## 2015-02-09 NOTE — Telephone Encounter (Signed)
Called pt no answer LMOM md has place referral.../lmb 

## 2015-02-17 ENCOUNTER — Other Ambulatory Visit: Payer: Self-pay | Admitting: Internal Medicine

## 2015-02-21 ENCOUNTER — Ambulatory Visit (HOSPITAL_BASED_OUTPATIENT_CLINIC_OR_DEPARTMENT_OTHER): Payer: Medicare Other

## 2015-02-21 ENCOUNTER — Encounter: Payer: Self-pay | Admitting: Hematology and Oncology

## 2015-02-21 ENCOUNTER — Ambulatory Visit (HOSPITAL_BASED_OUTPATIENT_CLINIC_OR_DEPARTMENT_OTHER): Payer: Medicare Other | Admitting: Hematology and Oncology

## 2015-02-21 ENCOUNTER — Telehealth: Payer: Self-pay | Admitting: Hematology and Oncology

## 2015-02-21 VITALS — BP 142/63 | HR 71 | Temp 99.0°F | Resp 18 | Ht 61.0 in | Wt 171.6 lb

## 2015-02-21 DIAGNOSIS — Z8572 Personal history of non-Hodgkin lymphomas: Secondary | ICD-10-CM

## 2015-02-21 DIAGNOSIS — R350 Frequency of micturition: Secondary | ICD-10-CM | POA: Diagnosis not present

## 2015-02-21 DIAGNOSIS — R778 Other specified abnormalities of plasma proteins: Secondary | ICD-10-CM

## 2015-02-21 DIAGNOSIS — R51 Headache: Secondary | ICD-10-CM | POA: Diagnosis not present

## 2015-02-21 DIAGNOSIS — G8929 Other chronic pain: Secondary | ICD-10-CM

## 2015-02-21 DIAGNOSIS — R531 Weakness: Secondary | ICD-10-CM

## 2015-02-21 DIAGNOSIS — R5382 Chronic fatigue, unspecified: Secondary | ICD-10-CM | POA: Diagnosis not present

## 2015-02-21 DIAGNOSIS — M47899 Other spondylosis, site unspecified: Secondary | ICD-10-CM

## 2015-02-21 DIAGNOSIS — C8590 Non-Hodgkin lymphoma, unspecified, unspecified site: Secondary | ICD-10-CM

## 2015-02-21 DIAGNOSIS — R769 Abnormal immunological finding in serum, unspecified: Secondary | ICD-10-CM | POA: Diagnosis not present

## 2015-02-21 DIAGNOSIS — R519 Headache, unspecified: Secondary | ICD-10-CM

## 2015-02-21 HISTORY — DX: Frequency of micturition: R35.0

## 2015-02-21 HISTORY — DX: Headache, unspecified: R51.9

## 2015-02-21 HISTORY — DX: Other chronic pain: G89.29

## 2015-02-21 HISTORY — DX: Chronic fatigue, unspecified: R53.82

## 2015-02-21 HISTORY — DX: Other specified abnormalities of plasma proteins: R77.8

## 2015-02-21 LAB — URINALYSIS, MICROSCOPIC - CHCC
BILIRUBIN (URINE): NEGATIVE
BLOOD: NEGATIVE
GLUCOSE UR CHCC: NEGATIVE mg/dL
KETONES: NEGATIVE mg/dL
Nitrite: NEGATIVE
PH: 6.5 (ref 4.6–8.0)
PROTEIN: NEGATIVE mg/dL
SPECIFIC GRAVITY, URINE: 1.005 (ref 1.003–1.035)
Urobilinogen, UR: 0.2 mg/dL (ref 0.2–1)

## 2015-02-21 LAB — TSH CHCC: TSH: 2.279 m(IU)/L (ref 0.308–3.960)

## 2015-02-21 LAB — CBC WITH DIFFERENTIAL/PLATELET
BASO%: 1.4 % (ref 0.0–2.0)
Basophils Absolute: 0.1 10*3/uL (ref 0.0–0.1)
EOS%: 4.9 % (ref 0.0–7.0)
Eosinophils Absolute: 0.3 10*3/uL (ref 0.0–0.5)
HEMATOCRIT: 39.4 % (ref 34.8–46.6)
HGB: 13.3 g/dL (ref 11.6–15.9)
LYMPH#: 1.1 10*3/uL (ref 0.9–3.3)
LYMPH%: 16.8 % (ref 14.0–49.7)
MCH: 29.4 pg (ref 25.1–34.0)
MCHC: 33.6 g/dL (ref 31.5–36.0)
MCV: 87.3 fL (ref 79.5–101.0)
MONO#: 0.6 10*3/uL (ref 0.1–0.9)
MONO%: 9.9 % (ref 0.0–14.0)
NEUT%: 67 % (ref 38.4–76.8)
NEUTROS ABS: 4.2 10*3/uL (ref 1.5–6.5)
PLATELETS: 140 10*3/uL — AB (ref 145–400)
RBC: 4.52 10*6/uL (ref 3.70–5.45)
RDW: 15.5 % — AB (ref 11.2–14.5)
WBC: 6.3 10*3/uL (ref 3.9–10.3)

## 2015-02-21 LAB — LACTATE DEHYDROGENASE (CC13): LDH: 211 U/L (ref 125–245)

## 2015-02-21 LAB — COMPREHENSIVE METABOLIC PANEL (CC13)
ALT: 28 U/L (ref 0–55)
ANION GAP: 9 meq/L (ref 3–11)
AST: 24 U/L (ref 5–34)
Albumin: 4.2 g/dL (ref 3.5–5.0)
Alkaline Phosphatase: 72 U/L (ref 40–150)
BILIRUBIN TOTAL: 0.93 mg/dL (ref 0.20–1.20)
BUN: 15.4 mg/dL (ref 7.0–26.0)
CO2: 25 meq/L (ref 22–29)
CREATININE: 0.7 mg/dL (ref 0.6–1.1)
Calcium: 9.7 mg/dL (ref 8.4–10.4)
Chloride: 106 mEq/L (ref 98–109)
EGFR: 80 mL/min/{1.73_m2} — ABNORMAL LOW (ref >90)
Glucose: 103 mg/dl (ref 70–140)
Potassium: 4.1 mEq/L (ref 3.5–5.1)
Sodium: 140 mEq/L (ref 136–145)
TOTAL PROTEIN: 6.1 g/dL — AB (ref 6.4–8.3)

## 2015-02-21 NOTE — Assessment & Plan Note (Signed)
She had some baseline screening blood work not long ago which show mild abnormalities. The patient is convinced that she may have multiple myeloma due to chronic, musculoskeletal pain. I will order additional workup to exclude monoclonal paraproteinemia.

## 2015-02-21 NOTE — Assessment & Plan Note (Signed)
She has chronic, intractable headaches over the past 3 months. She is convinced that she may have malignant disease/gioma. I will proceed with screening CT scan only for now for proceeding to order MRI imaging.

## 2015-02-21 NOTE — Telephone Encounter (Signed)
per pof to sch pt appt-gave pt contrast-gave avs-sent back to lab

## 2015-02-21 NOTE — Assessment & Plan Note (Signed)
The patient was treated more than 10 years ago. Her last imaging study in 2014 show no evidence of disease. The patient is convinced that the fact that she is not feeling well could be related to recurrence of lymphoma. She has some anorexia, generalized weakness and recurrent, unresolved flulike illness. I will proceed to order additional workup with blood work and imaging study to exclude disease recurrence.

## 2015-02-21 NOTE — Progress Notes (Signed)
[facility] progress notes  Patient Care Team: Biagio Borg, MD as PCP - General (Internal Medicine) Concha Norway, MD as Consulting Physician (Internal Medicine) Unice Bailey, MD as Consulting Physician (Rheumatology)  CHIEF COMPLAINTS/PURPOSE OF VISIT:  History of lymphoma  HISTORY OF PRESENTING ILLNESS:  Belinda Mcdowell 74 y.o. female was transferred to my care after her prior physician has left.  I reviewed the patient's records extensive and collaborated the history with the patient. Summary of her history is as follows: Oncology History   --2005.  Living in [place], [place]. Non-Hodgkin's lymphoma, follicular cell origin, grade 1, CD10 and CD20 positive presenting as a right submandibular mass. PET scan showed stage  III disease.    --September, 2005 - March 2006.  Received 8 cycles of Rituxan,fludarabine, mitoxantrone and Decadron.  --March, 2006 - March, 2008.  Received maintenance Rituxan x 2 years.    --05/02/2011.   PET scan c/w NED     --06/22/2012.  CT of neck, chest, abdomen and pelvis, c/w NED. The patient is not on any therapy.     History of non-Hodgkin's lymphoma   She was last seen by an oncologist here in September 2014. CT scan of the chest, abdomen and pelvis in January 2014 were negative for disease recurrence. The patient verbalizes extreme frustration that I am her fifth oncologist since she was diagnosed. She asked for referral back here because she felt "terrible". She felt "agitated" because she has been feeling "sick" for so long. When I asked her why she has felt sick, she complained of chronic headaches and dizziness. - She is convinced that she may have recurrence of her lymphoma.  - With the headaches, she fears she might have glioma. - With her chronic back pain, she thought she might have multiple myeloma. She felt she is constantly "sick" with constant flu-like illness. She says she has been battling with cold-like symptoms  for 3 months with sneezing, nasal drips and productive cough. She was treated with antibiotics for urinary tract infection in July. She denies new lymphadenopathy. She has some anorexia. Compared to her weight 2 years ago, she has actually gained weight. She has no energy and unable to function at home.  MEDICAL HISTORY:  Past Medical History  Diagnosis Date  . Generalized headaches   . UTI (lower urinary tract infection)   . Allergic rhinitis, cause unspecified   . Arthritis   . Depression   . Insomnia   . Hyperlipidemia   . Anxiety   . Non-Hodgkin lymphoma 11/18/2011    Stage 3  . PVC's (premature ventricular contractions)     by holter  previously thought to be A. fib  . Fibromyalgia 11/18/2011  . Mucous retention cyst of maxillary sinus 11/18/2011    Right   . Psoriasis 11/18/2011    On MTX  . Anemia, unspecified 11/18/2011    Prior on aranesp  . PUD (peptic ulcer disease) 11/18/2011    Duodenal at age 61 and 73  . Degenerative joint disease 11/18/2011    diffuse  . Lumbar disc disease 11/18/2011  . Sinus bradycardia   . Allergy     states she has environmental allergies; and takes zyrtec  . Abnormal SPEP 02/21/2015  . 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 Increased urinary frequency 02/21/2015  . Chronic fatigue 02/21/2015    SURGICAL HISTORY: Past Surgical History  Procedure Laterality Date  . Appendectomy    . Abdominal hysterectomy      for dysfunctional  bleeding    SOCIAL HISTORY: Social History   Social History  . Marital Status: Single    Spouse Name: N/A  . Number of Children: N/A  . Years of Education: 16   Occupational History  . Teacher, Retired    Social History Main Topics  . Smoking status: Former Smoker -- 0.25 packs/day for 15 years    Types: Cigarettes  . Smokeless tobacco: Never Used     Comment: smoked before 20's   . Alcohol Use: No     Comment: no drinking  . Drug Use: No  . Sexual Activity: Not on file   Other Topics Concern  .  Not on file   Social History Narrative   Lives alone in apt with at [facility] near family / son and dtr.   Feels secure;     FAMILY HISTORY: Family History  Problem Relation Age of Onset  . Arthritis Other   . Arthritis Mother   . Arthritis Sister   . Hypertension Sister     ALLERGIES:  has No Known Allergies.  MEDICATIONS:  Current Outpatient Prescriptions  Medication Sig Dispense Refill  . folic acid (FOLVITE) 732 MCG tablet Take 400 mcg by mouth daily.    Marland Kitchen ALPRAZolam (XANAX) 0.5 MG tablet Take 1 tablet (0.5 mg total) by mouth 2 (two) times daily as needed for anxiety. 60 tablet 2  . Alum & Mag Hydroxide-Simeth (MAGIC MOUTHWASH) SOLN Take 5 mLs by mouth 3 (three) times daily as needed for mouth pain. 400 mL 0  . atorvastatin (LIPITOR) 20 MG tablet Take 1 tablet (20 mg total) by mouth daily. 90 tablet 3  . cetirizine (ZYRTEC) 10 MG tablet Take 1 tablet (10 mg total) by mouth daily. 90 tablet 3  . famotidine (PEPCID) 20 MG tablet Take 1 tablet (20 mg total) by mouth 2 (two) times daily. 180 tablet 3  . hydrochlorothiazide (HYDRODIURIL) 12.5 MG tablet Take 1 tablet (12.5 mg total) by mouth daily. 90 tablet 3  . methotrexate (RHEUMATREX) 2.5 MG tablet Take 7.5 mg by mouth once a week.    Marland Kitchen PARoxetine (PAXIL) 20 MG tablet Take 1 tablet (20 mg total) by mouth every morning. 90 tablet 3  . tiZANidine (ZANAFLEX) 4 MG tablet Take 1 tablet (4 mg total) by mouth every 8 (eight) hours as needed. 180 tablet 3  . traZODone (DESYREL) 50 MG tablet Take 0.5-1 tablets (25-50 mg total) by mouth at bedtime as needed for sleep. 30 tablet 5   No current facility-administered medications for this visit.    REVIEW OF SYSTEMS:   Constitutional: Denies fevers, chills or abnormal night sweats Eyes: Denies blurriness of vision, double vision or watery eyes Ears, nose, mouth, throat, and face: Denies mucositis  Cardiovascular: Denies palpitation, chest discomfort or lower extremity  swelling Gastrointestinal:  Denies nausea, heartburn or change in bowel habits Skin: Denies abnormal skin rashes Lymphatics: Denies new lymphadenopathy or easy bruising Neurological:Denies numbness, tingling  Behavioral/Psych: Mood is stable, no new changes  All other systems were reviewed with the patient and are negative.  PHYSICAL EXAMINATION: ECOG PERFORMANCE STATUS: 1 - Symptomatic but completely ambulatory  Filed Vitals:   02/21/15 1128  BP: 142/63  Pulse: 71  Temp: 99 F (37.2 C)  Resp: 18   Filed Weights   02/21/15 1128  Weight: 171 lb 9.6 oz (77.837 kg)    GENERAL:alert, no distress and comfortable. She is mildly obese SKIN: skin color, texture, turgor are normal, no rashes or significant  lesions EYES: normal, conjunctiva are pink and non-injected, sclera clear OROPHARYNX:no exudate, normal lips, buccal mucosa, and tongue  NECK: supple, thyroid normal size, non-tender, without nodularity LYMPH:  no palpable lymphadenopathy in the cervical, axillary or inguinal LUNGS: clear to auscultation and percussion with normal breathing effort HEART: regular rate & rhythm and no murmurs without lower extremity edema ABDOMEN:abdomen soft, non-tender and normal bowel sounds Musculoskeletal:no cyanosis of digits and no clubbing  PSYCH: alert & oriented x 3 with fluent speech NEURO: no focal motor/sensory deficits  LABORATORY DATA:  I have reviewed the data as listed Lab Results  Component Value Date   WBC 6.8 12/02/2014   HGB 12.9 12/02/2014   HCT 38.8 12/02/2014   MCV 88.7 12/02/2014   PLT 170.0 12/02/2014    Recent Labs  12/02/14 1529  NA 139  K 4.2  CL 103  CO2 30  GLUCOSE 99  BUN 24*  CREATININE 0.72  CALCIUM 9.9  PROT 6.0  ALBUMIN 4.4  AST 27  ALT 35  ALKPHOS 83  BILITOT 0.8  BILIDIR 0.2    ASSESSMENT & PLAN:  History of non-Hodgkin's lymphoma The patient was treated more than 10 years ago. Her last imaging study in 2014 show no evidence of  disease. The patient is convinced that the fact that she is not feeling well could be related to recurrence of lymphoma. She has some anorexia, generalized weakness and recurrent, unresolved flulike illness. I will proceed to order additional workup with blood work and imaging study to exclude disease recurrence.  Abnormal SPEP She had some baseline screening blood work not long ago which show mild abnormalities. The patient is convinced that she may have multiple myeloma due to chronic, musculoskeletal pain. I will order additional workup to exclude monoclonal paraproteinemia.  Chronic headache disorder She has chronic, intractable headaches over the past 3 months. She is convinced that she may have malignant disease/gioma. I will proceed with screening CT scan only for now for proceeding to order MRI imaging.  Degenerative joint disease She has chronic degenerative joint disease and has been on high-dose methotrexate for 11 years. She felt that the methotrexate is helping her and is reluctant to discontinue treatment. She has a rheumatologist appointment pending in the new future.  Chronic fatigue She has chronic fatigue and is on multiple different medications for history of anxiety/depression. She does not feel that the medications were the cause of her excessive fatigue. I will order repeat thyroid function test to follow and exclude hypothyroidism.  Increased urinary frequency She had history of urinary checked infection and was last treated with antibiotics several months ago. She noted increased urinary frequency without dysuria or hematuria. I will order repeat urinalysis to exclude recurrent urinary tract infection.   Orders Placed This Encounter  Procedures  . Urine culture    Standing Status: Future     Number of Occurrences:      Standing Expiration Date: 03/27/2016  . DG Bone Survey Met    Standing Status: Future     Number of Occurrences:      Standing Expiration  Date: 04/22/2016    Order Specific Question:  Reason for Exam (SYMPTOM  OR DIAGNOSIS REQUIRED)    Answer:  hx lymphoma, new weakness, anorexia, exclude reccurence, also headache    Order Specific Question:  Preferred imaging location?    Answer:  [facility]  . CT Chest W Contrast    Standing Status: Future     Number of Occurrences:  Standing Expiration Date: 04/22/2016    Order Specific Question:  Reason for Exam (SYMPTOM  OR DIAGNOSIS REQUIRED)    Answer:  hx lymphoma, new weakness, anorexia, exclude reccurence, also headache    Order Specific Question:  Preferred imaging location?    Answer:  [facility]  . CT Abdomen Pelvis W Contrast    Standing Status: Future     Number of Occurrences:      Standing Expiration Date: 05/23/2016    Order Specific Question:  Reason for Exam (SYMPTOM  OR DIAGNOSIS REQUIRED)    Answer:  hx lymphoma, new weakness, anorexia, exclude reccurence, also headache    Order Specific Question:  Preferred imaging location?    Answer:  [facility]  . CT Head W Wo Contrast    Standing Status: Future     Number of Occurrences:      Standing Expiration Date: 02/21/2016    Order Specific Question:  Reason for Exam (SYMPTOM  OR DIAGNOSIS REQUIRED)    Answer:  hx lymphoma, new weakness, anorexia, exclude reccurence, also headache    Order Specific Question:  Preferred imaging location?    Answer:  [facility]  . CBC with Differential/Platelet    Standing Status: Future     Number of Occurrences:      Standing Expiration Date: 03/27/2016  . Comprehensive metabolic panel    Standing Status: Future     Number of Occurrences:      Standing Expiration Date: 03/27/2016  . Lactate dehydrogenase    Standing Status: Future     Number of Occurrences:      Standing Expiration Date: 03/27/2016  . SPEP & IFE with QIG    Standing Status: Future     Number of Occurrences:      Standing Expiration Date: 03/27/2016  . Kappa/lambda light  chains    Standing Status: Future     Number of Occurrences:      Standing Expiration Date: 03/27/2016  . Sedimentation rate    Standing Status: Future     Number of Occurrences:      Standing Expiration Date: 03/27/2016  . Urinalysis, Microscopic - [facility]    Standing Status: Future     Number of Occurrences:      Standing Expiration Date: 03/27/2016  . T4, free    Standing Status: Future     Number of Occurrences:      Standing Expiration Date: 03/27/2016  . TSH    Standing Status: Future     Number of Occurrences:      Standing Expiration Date: 03/27/2016    All questions were answered. The patient knows to call the clinic with any problems, questions or concerns. I spent 30 minutes counseling the patient face to face. The total time spent in the appointment was 40 minutes and more than 50% was on counseling.     [facility], NI, MD 02/21/2015 12:18 PM

## 2015-02-21 NOTE — Assessment & Plan Note (Signed)
She had history of urinary checked infection and was last treated with antibiotics several months ago. She noted increased urinary frequency without dysuria or hematuria. I will order repeat urinalysis to exclude recurrent urinary tract infection.

## 2015-02-21 NOTE — Assessment & Plan Note (Signed)
She has chronic fatigue and is on multiple different medications for history of anxiety/depression. She does not feel that the medications were the cause of her excessive fatigue. I will order repeat thyroid function test to follow and exclude hypothyroidism.

## 2015-02-21 NOTE — Assessment & Plan Note (Signed)
She has chronic degenerative joint disease and has been on high-dose methotrexate for 11 years. She felt that the methotrexate is helping her and is reluctant to discontinue treatment. She has a rheumatologist appointment pending in the new future.

## 2015-02-23 ENCOUNTER — Telehealth: Payer: Self-pay | Admitting: *Deleted

## 2015-02-23 LAB — T4, FREE: Free T4: 0.91 ng/dL (ref 0.80–1.80)

## 2015-02-23 LAB — SPEP & IFE WITH QIG
ALBUMIN ELP: 4.2 g/dL (ref 3.8–4.8)
ALPHA-1-GLOBULIN: 0.3 g/dL (ref 0.2–0.3)
Alpha-2-Globulin: 0.7 g/dL (ref 0.5–0.9)
BETA 2: 0.2 g/dL (ref 0.2–0.5)
Beta Globulin: 0.4 g/dL (ref 0.4–0.6)
GAMMA GLOBULIN: 0.2 g/dL — AB (ref 0.8–1.7)
IGG (IMMUNOGLOBIN G), SERUM: 174 mg/dL — AB (ref 690–1700)
IGM, SERUM: 7 mg/dL — AB (ref 52–322)
TOTAL PROTEIN, SERUM ELECTROPHOR: 6 g/dL — AB (ref 6.1–8.1)

## 2015-02-23 LAB — KAPPA/LAMBDA LIGHT CHAINS
KAPPA FREE LGHT CHN: 0.32 mg/dL — AB (ref 0.33–1.94)
Kappa:Lambda Ratio: 0.84 (ref 0.26–1.65)
Lambda Free Lght Chn: 0.38 mg/dL — ABNORMAL LOW (ref 0.57–2.63)

## 2015-02-23 LAB — URINE CULTURE

## 2015-02-23 LAB — SEDIMENTATION RATE: Sed Rate: 1 mm/hr (ref 0–30)

## 2015-02-23 MED ORDER — CEFPODOXIME PROXETIL 100 MG PO TABS: 100.0000 mg | ORAL_TABLET | Freq: Two times a day (BID) | ORAL | Status: DC

## 2015-02-23 NOTE — Telephone Encounter (Signed)
Informed pt of Urine culture shows UTI per Dr. Alvy Bimler and she has ordered antibiotic to take twice daily for 7 days.  Rx sent to [facility].  Start taking tonight and call us if any problems.  Pt verbalized understanding.

## 2015-02-23 NOTE — Telephone Encounter (Signed)
-----   Message from Heath Lark, MD sent at 02/23/2015  9:44 AM EDT ----- Regarding: persistent UTI Please let her know her repeat urine is positive for persistent UTI Defer all other questions to next visit I recommend cefpodoxime 100 mg BID X 7 days Please call in to her pharmacy ----- Message -----    From: [facility]    Sent: 02/21/2015  12:54 PM      To: Heath Lark, MD

## 2015-02-27 ENCOUNTER — Other Ambulatory Visit: Payer: Self-pay | Admitting: *Deleted

## 2015-02-27 ENCOUNTER — Telehealth: Payer: Self-pay | Admitting: *Deleted

## 2015-02-27 MED ORDER — CIPROFLOXACIN HCL 250 MG PO TABS: 250.0000 mg | ORAL_TABLET | Freq: Two times a day (BID) | ORAL | Status: DC

## 2015-02-27 NOTE — Telephone Encounter (Signed)
Please replace with cipro 250 mg BID PO X 7 days, no refill

## 2015-02-27 NOTE — Telephone Encounter (Signed)
VANTIN NOT COVERED BY PT.'S PLAN.

## 2015-02-28 ENCOUNTER — Ambulatory Visit (HOSPITAL_COMMUNITY)
Admission: RE | Admit: 2015-02-28 | Discharge: 2015-02-28 | Disposition: A | Discharge status: Home / Self Care | Payer: Medicare Other | Source: Ambulatory Visit | Attending: Hematology and Oncology | Admitting: Hematology and Oncology

## 2015-02-28 ENCOUNTER — Ambulatory Visit (HOSPITAL_COMMUNITY): Payer: Medicare Other

## 2015-02-28 ENCOUNTER — Encounter (HOSPITAL_COMMUNITY): Payer: Self-pay

## 2015-02-28 DIAGNOSIS — Z9221 Personal history of antineoplastic chemotherapy: Secondary | ICD-10-CM | POA: Diagnosis not present

## 2015-02-28 DIAGNOSIS — R769 Abnormal immunological finding in serum, unspecified: Secondary | ICD-10-CM | POA: Insufficient documentation

## 2015-02-28 DIAGNOSIS — R630 Anorexia: Secondary | ICD-10-CM | POA: Diagnosis not present

## 2015-02-28 DIAGNOSIS — G939 Disorder of brain, unspecified: Secondary | ICD-10-CM | POA: Diagnosis not present

## 2015-02-28 DIAGNOSIS — Z90710 Acquired absence of both cervix and uterus: Secondary | ICD-10-CM | POA: Diagnosis not present

## 2015-02-28 DIAGNOSIS — M549 Dorsalgia, unspecified: Secondary | ICD-10-CM | POA: Diagnosis not present

## 2015-02-28 DIAGNOSIS — C8590 Non-Hodgkin lymphoma, unspecified, unspecified site: Secondary | ICD-10-CM

## 2015-02-28 DIAGNOSIS — R531 Weakness: Secondary | ICD-10-CM | POA: Insufficient documentation

## 2015-02-28 DIAGNOSIS — E279 Disorder of adrenal gland, unspecified: Secondary | ICD-10-CM | POA: Insufficient documentation

## 2015-02-28 DIAGNOSIS — R51 Headache: Secondary | ICD-10-CM | POA: Diagnosis not present

## 2015-02-28 DIAGNOSIS — R519 Headache, unspecified: Secondary | ICD-10-CM

## 2015-02-28 DIAGNOSIS — M5030 Other cervical disc degeneration, unspecified cervical region: Secondary | ICD-10-CM | POA: Insufficient documentation

## 2015-02-28 DIAGNOSIS — M5136 Other intervertebral disc degeneration, lumbar region: Secondary | ICD-10-CM | POA: Insufficient documentation

## 2015-02-28 DIAGNOSIS — K5730 Diverticulosis of large intestine without perforation or abscess without bleeding: Secondary | ICD-10-CM | POA: Insufficient documentation

## 2015-02-28 DIAGNOSIS — G8929 Other chronic pain: Secondary | ICD-10-CM

## 2015-02-28 DIAGNOSIS — R778 Other specified abnormalities of plasma proteins: Secondary | ICD-10-CM

## 2015-02-28 MED ORDER — IOHEXOL 300 MG/ML  SOLN
100.0000 mL | Freq: Once | INTRAMUSCULAR | Status: AC | PRN
  Administered 2015-02-28: 100 mL via INTRAVENOUS

## 2015-03-02 ENCOUNTER — Telehealth: Payer: Self-pay | Admitting: Hematology and Oncology

## 2015-03-02 ENCOUNTER — Encounter: Payer: Self-pay | Admitting: Hematology and Oncology

## 2015-03-02 ENCOUNTER — Ambulatory Visit (HOSPITAL_BASED_OUTPATIENT_CLINIC_OR_DEPARTMENT_OTHER): Payer: Medicare Other | Admitting: Hematology and Oncology

## 2015-03-02 VITALS — BP 149/76 | HR 72 | Temp 98.4°F | Resp 18 | Ht 61.0 in | Wt 171.4 lb

## 2015-03-02 DIAGNOSIS — G939 Disorder of brain, unspecified: Secondary | ICD-10-CM | POA: Diagnosis not present

## 2015-03-02 DIAGNOSIS — Z23 Encounter for immunization: Secondary | ICD-10-CM | POA: Diagnosis not present

## 2015-03-02 DIAGNOSIS — C8590 Non-Hodgkin lymphoma, unspecified, unspecified site: Secondary | ICD-10-CM

## 2015-03-02 DIAGNOSIS — G44229 Chronic tension-type headache, not intractable: Secondary | ICD-10-CM | POA: Diagnosis not present

## 2015-03-02 DIAGNOSIS — D801 Nonfamilial hypogammaglobulinemia: Secondary | ICD-10-CM | POA: Insufficient documentation

## 2015-03-02 DIAGNOSIS — Z8572 Personal history of non-Hodgkin lymphomas: Secondary | ICD-10-CM

## 2015-03-02 HISTORY — DX: Disorder of brain, unspecified: G93.9

## 2015-03-02 MED ORDER — INFLUENZA VAC SPLIT QUAD 0.5 ML IM SUSY
0.5000 mL | PREFILLED_SYRINGE | Freq: Once | INTRAMUSCULAR | Status: AC
  Administered 2015-03-02: 0.5 mL via INTRAMUSCULAR
  Filled 2015-03-02: qty 0.5

## 2015-03-02 NOTE — Patient Instructions (Signed)

## 2015-03-02 NOTE — Telephone Encounter (Signed)
Gave patient avs report and appointments for October and November. [facility] will call re mri - patient aware. Spoke with Inez Catalina, NP Coord at [facility] - patient will see Dr. Jaynee Eagles 10/12 @ 3 pm to arrive 2:30 pm - [place] - [phone].

## 2015-03-03 NOTE — Assessment & Plan Note (Signed)
Clinical examination and imaging study showed no evidence of lymphoma recurrence. She is reassured.

## 2015-03-03 NOTE — Assessment & Plan Note (Signed)
She has recurrent infections. She have profound acquired panhypogammaglobulinemia. I recommend a trial of monthly IVIG infusion. The risks, benefits, side effects of treatment is explained to the patient and she agreed to proceed

## 2015-03-03 NOTE — Assessment & Plan Note (Signed)
She has chronic headaches of unknown etiology. CT scan of the head showed a lesion in the left temporal region. I have ordered MRI. The lesion appeared to be highly calcified and is likely benign. I recommend neurology consultation and she agreed to proceed.

## 2015-03-03 NOTE — Progress Notes (Signed)
[facility] OFFICE PROGRESS NOTE  Patient Care Team: Biagio Borg, MD as PCP - General (Internal Medicine) Concha Norway, MD as Consulting Physician (Internal Medicine) Unice Bailey, MD as Consulting Physician (Rheumatology)  SUMMARY OF ONCOLOGIC HISTORY: Oncology History   --2005.  Living in [place], [place]. Non-Hodgkin's lymphoma, follicular cell origin, grade 1, CD10 and CD20 positive presenting as a right submandibular mass. PET scan showed stage  III disease.    --September, 2005 - March 2006.  Received 8 cycles of Rituxan,fludarabine, mitoxantrone and Decadron.  --March, 2006 - March, 2008.  Received maintenance Rituxan x 2 years.    --05/02/2011.   PET scan c/w NED     --06/22/2012.  CT of neck, chest, abdomen and pelvis, c/w NED. The patient is not on any therapy.     History of non-Hodgkin's lymphoma    INTERVAL HISTORY: Please see below for problem oriented charting. She continues to complain of fatigue. She denies any urinary symptoms while on antibiotics. Continues to have intermittent headaches  REVIEW OF SYSTEMS:   Constitutional: Denies fevers, chills or abnormal weight loss Eyes: Denies blurriness of vision Ears, nose, mouth, throat, and face: Denies mucositis or sore throat Respiratory: Denies cough, dyspnea or wheezes Cardiovascular: Denies palpitation, chest discomfort or lower extremity swelling Gastrointestinal:  Denies nausea, heartburn or change in bowel habits Skin: Denies abnormal skin rashes Lymphatics: Denies new lymphadenopathy or easy bruising Neurological:Denies numbness, tingling or new weaknesses Behavioral/Psych: Mood is stable, no new changes  All other systems were reviewed with the patient and are negative.  I have reviewed the past medical history, past surgical history, social history and family history with the patient and they are unchanged from previous note.  ALLERGIES:  has No Known Allergies.  MEDICATIONS:  Current  Outpatient Prescriptions  Medication Sig Dispense Refill  . ALPRAZolam (XANAX) 0.5 MG tablet Take 1 tablet (0.5 mg total) by mouth 2 (two) times daily as needed for anxiety. 60 tablet 2  . Alum & Mag Hydroxide-Simeth (MAGIC MOUTHWASH) SOLN Take 5 mLs by mouth 3 (three) times daily as needed for mouth pain. 400 mL 0  . atorvastatin (LIPITOR) 20 MG tablet Take 1 tablet (20 mg total) by mouth daily. 90 tablet 3  . cetirizine (ZYRTEC) 10 MG tablet Take 1 tablet (10 mg total) by mouth daily. 90 tablet 3  . ciprofloxacin (CIPRO) 250 MG tablet Take 1 tablet (250 mg total) by mouth 2 (two) times daily. 14 tablet 0  . famotidine (PEPCID) 20 MG tablet Take 1 tablet (20 mg total) by mouth 2 (two) times daily. 938 tablet 3  . folic acid (FOLVITE) 101 MCG tablet Take 400 mcg by mouth daily.    . methotrexate (RHEUMATREX) 2.5 MG tablet Take 7.5 mg by mouth once a week.    Marland Kitchen PARoxetine (PAXIL) 20 MG tablet Take 1 tablet (20 mg total) by mouth every morning. 90 tablet 3  . tiZANidine (ZANAFLEX) 4 MG tablet Take 1 tablet (4 mg total) by mouth every 8 (eight) hours as needed. 180 tablet 3  . traMADol (ULTRAM) 50 MG tablet   0  . traZODone (DESYREL) 50 MG tablet Take 0.5-1 tablets (25-50 mg total) by mouth at bedtime as needed for sleep. 30 tablet 5  . hydrochlorothiazide (HYDRODIURIL) 12.5 MG tablet Take 1 tablet (12.5 mg total) by mouth daily. (Patient not taking: Reported on 03/02/2015) 90 tablet 3   No current facility-administered medications for this visit.    PHYSICAL EXAMINATION: ECOG PERFORMANCE STATUS:  0 - Asymptomatic  Filed Vitals:   03/02/15 1442  BP: 149/76  Pulse: 72  Temp: 98.4 F (36.9 C)  Resp: 18   Filed Weights   03/02/15 1442  Weight: 171 lb 6.4 oz (77.747 kg)    GENERAL:alert, no distress and comfortable SKIN: skin color, texture, turgor are normal, no rashes or significant lesions EYES: normal, Conjunctiva are pink and non-injected, sclera clear Musculoskeletal:no cyanosis of  digits and no clubbing  NEURO: alert & oriented x 3 with fluent speech, no focal motor/sensory deficits  LABORATORY DATA:  I have reviewed the data as listed    Component Value Date/Time   NA 140 02/21/2015 1234   NA 139 12/02/2014 1529   K 4.1 02/21/2015 1234   K 4.2 12/02/2014 1529   CL 103 12/02/2014 1529   CL 103 06/22/2012 1045   CO2 25 02/21/2015 1234   CO2 30 12/02/2014 1529   GLUCOSE 103 02/21/2015 1234   GLUCOSE 99 12/02/2014 1529   GLUCOSE 92 06/22/2012 1045   BUN 15.4 02/21/2015 1234   BUN 24* 12/02/2014 1529   CREATININE 0.7 02/21/2015 1234   CREATININE 0.72 12/02/2014 1529   CALCIUM 9.7 02/21/2015 1234   CALCIUM 9.9 12/02/2014 1529   PROT 6.1* 02/21/2015 1234   PROT 6.0 12/02/2014 1529   ALBUMIN 4.2 02/21/2015 1234   ALBUMIN 4.4 12/02/2014 1529   AST 24 02/21/2015 1234   AST 27 12/02/2014 1529   ALT 28 02/21/2015 1234   ALT 35 12/02/2014 1529   ALKPHOS 72 02/21/2015 1234   ALKPHOS 83 12/02/2014 1529   BILITOT 0.93 02/21/2015 1234   BILITOT 0.8 12/02/2014 1529    No results found for: SPEP, UPEP  Lab Results  Component Value Date   WBC 6.3 02/21/2015   NEUTROABS 4.2 02/21/2015   HGB 13.3 02/21/2015   HCT 39.4 02/21/2015   MCV 87.3 02/21/2015   PLT 140* 02/21/2015      Chemistry      Component Value Date/Time   NA 140 02/21/2015 1234   NA 139 12/02/2014 1529   K 4.1 02/21/2015 1234   K 4.2 12/02/2014 1529   CL 103 12/02/2014 1529   CL 103 06/22/2012 1045   CO2 25 02/21/2015 1234   CO2 30 12/02/2014 1529   BUN 15.4 02/21/2015 1234   BUN 24* 12/02/2014 1529   CREATININE 0.7 02/21/2015 1234   CREATININE 0.72 12/02/2014 1529      Component Value Date/Time   CALCIUM 9.7 02/21/2015 1234   CALCIUM 9.9 12/02/2014 1529   ALKPHOS 72 02/21/2015 1234   ALKPHOS 83 12/02/2014 1529   AST 24 02/21/2015 1234   AST 27 12/02/2014 1529   ALT 28 02/21/2015 1234   ALT 35 12/02/2014 1529   BILITOT 0.93 02/21/2015 1234   BILITOT 0.8 12/02/2014 1529        RADIOGRAPHIC STUDIES: I reviewed the CT scans with the patient  I have personally reviewed the radiological images as listed and agreed with the findings in the report.   ASSESSMENT & PLAN:  History of non-Hodgkin's lymphoma Clinical examination and imaging study showed no evidence of lymphoma recurrence. She is reassured.  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 She have profound acquired panhypogammaglobulinemia. I recommend a trial of monthly IVIG infusion. The risks, benefits, side effects of treatment is explained to the patient and she agreed to proceed   Chronic headache disorder She has chronic headaches of unknown etiology. CT scan of the head showed a lesion  in the left temporal region. I have ordered MRI. The lesion appeared to be highly calcified and is likely benign. I recommend neurology consultation and she agreed to proceed.   Orders Placed This Encounter  Procedures  . MR Brain W Wo Contrast    Standing Status: Future     Number of Occurrences:      Standing Expiration Date: 04/05/2016    Order Specific Question:  Reason for exam:    Answer:  brain lesion seen on CT    Order Specific Question:  Preferred imaging location?    Answer:  [facility]    Order Specific Question:  Does the patient have a pacemaker or implanted devices?    Answer:  No  . CBC with Differential/Platelet    Standing Status: Future     Number of Occurrences:      Standing Expiration Date: 04/05/2016  . Ambulatory referral to Neurology    Referral Priority:  Routine    Referral Type:  Consultation    Referral Reason:  Specialty Services Required    Requested Specialty:  Neurology    Number of Visits Requested:  1   All questions were answered. The patient knows to call the clinic with any problems, questions or concerns. No barriers to learning was detected. I spent 25 minutes counseling the patient face to face. The total time spent in  the appointment was 40 minutes and more than 50% was on counseling and review of test results     [facility], [place], MD 03/03/2015 9:20 AM

## 2015-03-08 ENCOUNTER — Encounter: Payer: Self-pay | Admitting: Neurology

## 2015-03-08 ENCOUNTER — Ambulatory Visit (INDEPENDENT_AMBULATORY_CARE_PROVIDER_SITE_OTHER): Payer: Medicare Other | Admitting: Neurology

## 2015-03-08 VITALS — BP 195/92 | HR 62 | Ht 61.0 in | Wt 168.8 lb

## 2015-03-08 DIAGNOSIS — D329 Benign neoplasm of meninges, unspecified: Secondary | ICD-10-CM

## 2015-03-08 NOTE — Progress Notes (Signed)
[facility]    Provider:  Dr Jaynee Eagles Referring Provider: Biagio Borg, MD Primary Care Physician:  Cathlean Cower, MD  CC:  meningioma  HPI:  Belinda Mcdowell is a 74 y.o. female here as a referral from Dr. Jenny Reichmann for a meningioma in the brain. 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 She was feeling poorly all summer and a workup revealed an incidental meningioma.  She has been feeling dizzy, terrible headaches, she has chronic weakness. This summer she started feeling worse, felt the heat made her worse, she has terrible headaches in the frontal areas, she was waking up with the headaches almost every day which would last all day. Nothing OTC helped. She felt dizzy and weak. She was having worsening dizziness. She describes nausea. The weakness is generalized, not focal. The headache is in the back of the head as well. Worse when laying down. Dizziness desribed as room spinning worse when lifting her head off of the pillow. She is having no recent eye worsening or vision changes.No personality changes. No new sensory changes. She is going to have infusions of gamma globulin due to immune disorder. No other focal neurologic complaints.   Reviewed notes, labs and imaging from outside physicians, which showed: CMP, CBC unremarkable  CT of the head performed October 2016, personally reviewed images and agree with the following: There is a 1.6 x 0.9 cm partially calcified extra-axial lesion subjacent to the left frontal bone near the vertex with evidence of some enhancement (series 9, image 28). There is no evidence of significant mass effect on the underlying left frontal lobe or associated brain edema. There is mild generalized cerebral atrophy.There is no evidence of acute infarct, intracranial hemorrhage, intra-axial mass, midline shift, or  extra-axial fluid collection.  Prior bilateral cataract extraction is noted. Major dural venous sinuses appear patent. A right maxillary sinus mucous retention cyst is partially visualized. Mastoid air cells are clear.  IMPRESSION: 1. No evidence of acute intracranial abnormality or intra-axial mass lesion. 2. 1.6 cm extra-axial left frontal lesion, most compatible with a meningioma. This could be further evaluated with brain MRI as clinically warranted.  Review of Systems: Patient complains of symptoms per HPI as well as the following symptoms: Fatigue, shortness of breath, feeling hot, spinning sensation, itching, allergies, runny nose, headache or weakness, dizziness, decreased energy. Pertinent negatives per HPI. All others negative.   Social History   Social History  . Marital Status: Single    Spouse Name: N/A  . Number of Children: 3  . Years of Education: 16   Occupational History  . Teacher, Retired    Social History Main Topics  . Smoking status: Former Smoker -- 0.25 packs/day for 15 years    Types: Cigarettes  . Smokeless tobacco: Never Used     Comment: smoked before 20's   . Alcohol Use: No     Comment: no drinking  . Drug Use: No  . Sexual Activity: Not on file   Other Topics Concern  . Not on file   Social History Narrative   Lives alone in apt with at [facility] near family / son and dtr.   Feels secure;    Caffeine use: Drinks coffee/tea- 1 per day    Family History  Problem Relation Age of Onset  . Arthritis Other   . Arthritis Mother   . Arthritis Sister   . Hypertension Sister  Past Medical History  Diagnosis Date  . Generalized headaches   . UTI (lower urinary tract infection)   . Allergic rhinitis, cause unspecified   . Arthritis   . Depression   . Insomnia   . Hyperlipidemia   . Anxiety   . Non-Hodgkin lymphoma ([place]) 11/18/2011    Stage 3  . PVC's (premature ventricular contractions)     by holter  previously thought to be  A. fib  . Fibromyalgia 11/18/2011  . Mucous retention cyst of maxillary sinus 11/18/2011    Right   . Psoriasis 11/18/2011    On MTX  . Anemia, unspecified 11/18/2011    Prior on aranesp  . PUD (peptic ulcer disease) 11/18/2011    Duodenal at age 21 and 61  . Degenerative joint disease 11/18/2011    diffuse  . Lumbar disc disease 11/18/2011  . Sinus bradycardia   . Allergy     states she has environmental allergies; and takes zyrtec  . Abnormal SPEP 02/21/2015  . 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 Increased urinary frequency 02/21/2015  . Chronic fatigue 02/21/2015  . 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    Past Surgical History  Procedure Laterality Date  . Appendectomy    . Abdominal hysterectomy      for dysfunctional bleeding    Current Outpatient Prescriptions  Medication Sig Dispense Refill  . ALPRAZolam (XANAX) 0.5 MG tablet Take 1 tablet (0.5 mg total) by mouth 2 (two) times daily as needed for anxiety. 60 tablet 2  . Alum & Mag Hydroxide-Simeth (MAGIC MOUTHWASH) SOLN Take 5 mLs by mouth 3 (three) times daily as needed for mouth pain. 400 mL 0  . atorvastatin (LIPITOR) 20 MG tablet Take 1 tablet (20 mg total) by mouth daily. 90 tablet 3  . cetirizine (ZYRTEC) 10 MG tablet Take 1 tablet (10 mg total) by mouth daily. 90 tablet 3  . famotidine (PEPCID) 20 MG tablet Take 1 tablet (20 mg total) by mouth 2 (two) times daily. 093 tablet 3  . folic acid (FOLVITE) 818 MCG tablet Take 400 mcg by mouth daily.    . methotrexate (RHEUMATREX) 2.5 MG tablet Take 7.5 mg by mouth once a week.    Marland Kitchen PARoxetine (PAXIL) 20 MG tablet Take 1 tablet (20 mg total) by mouth every morning. 90 tablet 3  . tiZANidine (ZANAFLEX) 4 MG tablet Take 1 tablet (4 mg total) by mouth every 8 (eight) hours as needed. 180 tablet 3  . traMADol (ULTRAM) 50 MG tablet   0  . traZODone (DESYREL) 50 MG tablet Take 0.5-1 tablets (25-50 mg total) by mouth at bedtime as needed for sleep. 30 tablet 5  .  hydrochlorothiazide (HYDRODIURIL) 12.5 MG tablet Take 1 tablet (12.5 mg total) by mouth daily. (Patient not taking: Reported on 03/02/2015) 90 tablet 3   No current facility-administered medications for this visit.    Allergies as of 03/08/2015  . (No Known Allergies)    Vitals: BP 195/92 mmHg  Pulse 62  Ht 5\' 1"  (1.549 m)  Wt 168 lb 12.8 oz (76.567 kg)  BMI 31.91 kg/m2 Last Weight:  Wt Readings from Last 1 Encounters:  03/08/15 168 lb 12.8 oz (76.567 kg)   Last Height:   Ht Readings from Last 1 Encounters:  03/08/15 5\' 1"  (1.549 m)    Physical exam: Exam: Gen: NAD, conversant, well nourised, obese, well groomed  CV: RRR, no MRG. No Carotid Bruits. No peripheral edema, warm, nontender Eyes: Conjunctivae clear without exudates or hemorrhage  Neuro: Detailed Neurologic Exam  Speech:    Speech is normal; fluent and spontaneous with normal comprehension.  Cognition:    The patient is oriented to person, place, and time;     recent and remote memory intact;     language fluent;     normal attention, concentration,     fund of knowledge Cranial Nerves:    The pupils are equal, round, and reactive to light. The fundi are normal and spontaneous venous pulsations are present. Visual fields are full to finger confrontation. Extraocular movements are intact. Trigeminal sensation is intact and the muscles of mastication are normal. The face is symmetric. The palate elevates in the midline. Hearing intact. Voice is normal. Shoulder shrug is normal. The tongue has normal motion without fasciculations.   Coordination:    Normal finger to nose and heel to shin. Normal rapid alternating movements.   Gait:    Heel-toe and tandem gait are normal.   Motor Observation:    No asymmetry, no atrophy, and no involuntary movements noted. Tone:    Normal muscle tone.    Posture:    Posture is normal. normal erect    Strength:    Strength is V/V in the upper and lower  limbs.      Sensation: intact to LT     Reflex Exam:  DTR's:    Deep tendon reflexes in the upper and lower extremities are normal bilaterally.   Toes:    The toes are downgoing bilaterally.   Clonus:    Clonus is absent.      Assessment/Plan:  74 year old with a left frontal meningioma that does not appear to be impacting the left frontal lobe. MRI of the brain is scheduled. Had a long discussion with patient about meningiomas and their usual benign course. At this point I don't think that patient's symptoms are being caused by meningioma which I believe was an incidental finding. However MRI of the brain will be more informative than the CAT scan.   She is scheduled the 20th for MRI of the brain  Sarina Ill, MD  [facility] [address] [place], [place] [address]  Phone [phone] Fax [phone]

## 2015-03-08 NOTE — Patient Instructions (Signed)
Overall you are doing fairly well but I do want to suggest a few things today:   Remember to drink plenty of fluid, eat healthy meals and do not skip any meals. Try to eat protein with a every meal and eat a healthy snack such as fruit or nuts in between meals. Try to keep a regular sleep-wake schedule and try to exercise daily, particularly in the form of walking, 20-30 minutes a day, if you can.   I would like to see you back in 3 months, sooner if we need to. Please call us with any interim questions, concerns, problems, updates or refill requests.   Our phone number is [phone]. We also have an after hours call service for urgent matters and there is a physician on-call for urgent questions. For any emergencies you know to call 911 or go to the nearest emergency room

## 2015-03-09 ENCOUNTER — Telehealth: Payer: Self-pay | Admitting: Internal Medicine

## 2015-03-09 NOTE — Telephone Encounter (Signed)
Pt request to speak to the assistant concern about her BP. She got it check yesterday and it was 198/92. Pt want Dr. Jenny Reichmann to prescribe her something but she has some restriction that she wants to speak to the assistant first. Please call her back

## 2015-03-10 ENCOUNTER — Telehealth: Payer: Self-pay | Admitting: Internal Medicine

## 2015-03-10 ENCOUNTER — Telehealth: Payer: Self-pay | Admitting: *Deleted

## 2015-03-10 NOTE — Telephone Encounter (Signed)
Pt left VM states needs Rx for her high blood pressure.  She was told her BP was very high yesterday at Neuro office.  She has called her PCP but is upset because she says she couldn't get an appt today and she doesn't understand why she needs to be seen to get Rx for her high blood pressure.  Says she was on Atenolol in the past and this worked well. Asks if Dr. Alvy Bimler will prescribe Atenolol?   I called pt back and explained that Dr. Alvy Bimler does not manage Hypertension and does not prescribe meds for high blood pressure.   I suggested she call her PCP back to make appt to manage this.  She verbalized understanding and says she has appt w/ another doctor on Monday.   She will have the fire dept check her blood pressure over the weekend.

## 2015-03-10 NOTE — Telephone Encounter (Signed)
Very sorry, but prescription for BP lowering medication requires OV to determine best one to take  Please make ROV

## 2015-03-10 NOTE — Telephone Encounter (Signed)
Pt called back and she states there is no reason for Dr. Jenny Reichmann to evaluate her because she is in the care of other specialists and they want her to get her bp medication from him.  Can you please call her back asap

## 2015-03-10 NOTE — Telephone Encounter (Signed)
Please advise pt that she will need an office visit for evaluation and treatment by the physician

## 2015-03-10 NOTE — Telephone Encounter (Signed)
Disregard

## 2015-03-11 ENCOUNTER — Ambulatory Visit: Payer: Medicare Other | Admitting: Family Medicine

## 2015-03-11 DIAGNOSIS — Z0289 Encounter for other administrative examinations: Secondary | ICD-10-CM

## 2015-03-12 DIAGNOSIS — D329 Benign neoplasm of meninges, unspecified: Secondary | ICD-10-CM | POA: Insufficient documentation

## 2015-03-14 ENCOUNTER — Ambulatory Visit (HOSPITAL_BASED_OUTPATIENT_CLINIC_OR_DEPARTMENT_OTHER): Payer: Medicare Other

## 2015-03-14 VITALS — BP 143/55 | HR 75 | Temp 98.5°F | Resp 18

## 2015-03-14 DIAGNOSIS — D801 Nonfamilial hypogammaglobulinemia: Secondary | ICD-10-CM

## 2015-03-14 MED ORDER — SODIUM CHLORIDE 0.9 % IV SOLN
Freq: Once | INTRAVENOUS | Status: AC
  Administered 2015-03-14: 09:00:00 via INTRAVENOUS

## 2015-03-14 MED ORDER — ACETAMINOPHEN 325 MG PO TABS
650.0000 mg | ORAL_TABLET | Freq: Four times a day (QID) | ORAL | Status: DC | PRN
  Administered 2015-03-14: 650 mg via ORAL

## 2015-03-14 MED ORDER — ACETAMINOPHEN 325 MG PO TABS
ORAL_TABLET | ORAL | Status: AC
  Filled 2015-03-14: qty 2

## 2015-03-14 MED ORDER — DIPHENHYDRAMINE HCL 25 MG PO CAPS
ORAL_CAPSULE | ORAL | Status: AC
  Filled 2015-03-14: qty 1

## 2015-03-14 MED ORDER — IMMUNE GLOBULIN (HUMAN) 20 GM/200ML IV SOLN
1.0000 g/kg | Freq: Once | INTRAVENOUS | Status: AC
  Administered 2015-03-14: 75 g via INTRAVENOUS
  Filled 2015-03-14: qty 750

## 2015-03-14 MED ORDER — DIPHENHYDRAMINE HCL 25 MG PO TABS
25.0000 mg | ORAL_TABLET | Freq: Three times a day (TID) | ORAL | Status: DC | PRN
  Administered 2015-03-14: 25 mg via ORAL
  Filled 2015-03-14: qty 1

## 2015-03-14 NOTE — Patient Instructions (Signed)

## 2015-03-14 NOTE — Progress Notes (Signed)
Pt tolerated IVIG infusion well. Pt monitored for 30 minutes post infusion. Pt and VS stable at time of discharge/

## 2015-03-15 ENCOUNTER — Ambulatory Visit (HOSPITAL_COMMUNITY): Admission: RE | Admit: 2015-03-15 | Payer: Medicare Other | Source: Ambulatory Visit

## 2015-03-16 ENCOUNTER — Ambulatory Visit (HOSPITAL_COMMUNITY)
Admission: RE | Admit: 2015-03-16 | Discharge: 2015-03-16 | Disposition: A | Discharge status: Home / Self Care | Payer: Medicare Other | Source: Ambulatory Visit | Attending: Hematology and Oncology | Admitting: Hematology and Oncology

## 2015-03-16 DIAGNOSIS — D329 Benign neoplasm of meninges, unspecified: Secondary | ICD-10-CM | POA: Insufficient documentation

## 2015-03-16 DIAGNOSIS — G939 Disorder of brain, unspecified: Secondary | ICD-10-CM | POA: Diagnosis not present

## 2015-03-16 MED ORDER — GADOBENATE DIMEGLUMINE 529 MG/ML IV SOLN
15.0000 mL | Freq: Once | INTRAVENOUS | Status: AC | PRN
  Administered 2015-03-16: 15 mL via INTRAVENOUS

## 2015-03-17 ENCOUNTER — Telehealth: Payer: Self-pay | Admitting: Neurology

## 2015-03-17 NOTE — Telephone Encounter (Signed)
Patient requests call back for MRI of the brain results below:   No acute infarct, hemorrhage, or mass lesion is present. The ventricles are of normal size. Mild atrophy is within normal limits for age. No significant extraaxial fluid collection is present. No significant white matter disease is present.  Flow is present in the major intracranial arteries. Bilateral lens replacements are noted. Mild mucosal disease is present in the maxillary sinuses bilaterally. The paranasal sinuses are otherwise clear.  Skullbase is within normal limits. Midline images demonstrate a relatively empty sella. Mild degenerative changes are present in the upper cervical spine.  The postcontrast images demonstrate no other pathologic enhancement.  IMPRESSION: 1. 6 x 20 mm meningioma confirmed over the left frontal convexity. 2. 10 mm osseous enhancing lesion appears partially lytic on the CT scan. 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 No other osseous metastases are present. 3. Normal MRI of the brain for age

## 2015-03-20 NOTE — Telephone Encounter (Signed)
I spoke with patient about the results. The meningioma and the hemangioma are both incidental findings. We will follow her clinically. Asked patient to follow up with me within the next year for repeat imaging to follow. She acknowledged and will follow with me in a year or sooner if she has new symptoms or worsening symptoms. thanks

## 2015-03-24 ENCOUNTER — Encounter: Payer: Self-pay | Admitting: Internal Medicine

## 2015-03-28 ENCOUNTER — Telehealth: Payer: Self-pay | Admitting: Internal Medicine

## 2015-03-28 NOTE — Telephone Encounter (Signed)
Patient dismissed from [facility] by Cathlean Cower MD , effective March 24, 2015. Dismissal letter sent out by certified / registered mail.  DAJ  Received signed domestic return receipt verifying delivery of certified letter on April 04, 2015. Article number [id number] [place] [id number]

## 2015-04-11 ENCOUNTER — Ambulatory Visit (HOSPITAL_BASED_OUTPATIENT_CLINIC_OR_DEPARTMENT_OTHER): Payer: Medicare Other

## 2015-04-11 ENCOUNTER — Other Ambulatory Visit: Payer: Self-pay | Admitting: Hematology and Oncology

## 2015-04-11 ENCOUNTER — Ambulatory Visit (HOSPITAL_BASED_OUTPATIENT_CLINIC_OR_DEPARTMENT_OTHER): Payer: Medicare Other | Admitting: Hematology and Oncology

## 2015-04-11 ENCOUNTER — Encounter: Payer: Self-pay | Admitting: Hematology and Oncology

## 2015-04-11 ENCOUNTER — Other Ambulatory Visit (HOSPITAL_BASED_OUTPATIENT_CLINIC_OR_DEPARTMENT_OTHER): Payer: Medicare Other

## 2015-04-11 VITALS — BP 144/69 | HR 68 | Temp 97.9°F | Resp 16

## 2015-04-11 VITALS — BP 162/80 | HR 70 | Temp 98.2°F | Resp 18 | Ht 64.0 in | Wt 170.3 lb

## 2015-04-11 DIAGNOSIS — Z8572 Personal history of non-Hodgkin lymphomas: Secondary | ICD-10-CM

## 2015-04-11 DIAGNOSIS — D801 Nonfamilial hypogammaglobulinemia: Secondary | ICD-10-CM

## 2015-04-11 DIAGNOSIS — I10 Essential (primary) hypertension: Secondary | ICD-10-CM | POA: Diagnosis not present

## 2015-04-11 DIAGNOSIS — D329 Benign neoplasm of meninges, unspecified: Secondary | ICD-10-CM | POA: Diagnosis not present

## 2015-04-11 LAB — CBC WITH DIFFERENTIAL/PLATELET
BASO%: 0.9 % (ref 0.0–2.0)
Basophils Absolute: 0.1 10*3/uL (ref 0.0–0.1)
EOS%: 5.7 % (ref 0.0–7.0)
Eosinophils Absolute: 0.3 10*3/uL (ref 0.0–0.5)
HEMATOCRIT: 37.4 % (ref 34.8–46.6)
HGB: 12.9 g/dL (ref 11.6–15.9)
LYMPH%: 22 % (ref 14.0–49.7)
MCH: 29.9 pg (ref 25.1–34.0)
MCHC: 34.5 g/dL (ref 31.5–36.0)
MCV: 86.6 fL (ref 79.5–101.0)
MONO#: 0.5 10*3/uL (ref 0.1–0.9)
MONO%: 9.4 % (ref 0.0–14.0)
NEUT#: 3.4 10*3/uL (ref 1.5–6.5)
NEUT%: 62 % (ref 38.4–76.8)
PLATELETS: 143 10*3/uL — AB (ref 145–400)
RBC: 4.32 10*6/uL (ref 3.70–5.45)
RDW: 15.3 % — ABNORMAL HIGH (ref 11.2–14.5)
WBC: 5.5 10*3/uL (ref 3.9–10.3)
lymph#: 1.2 10*3/uL (ref 0.9–3.3)

## 2015-04-11 MED ORDER — ACETAMINOPHEN 325 MG PO TABS
650.0000 mg | ORAL_TABLET | Freq: Four times a day (QID) | ORAL | Status: DC | PRN
  Administered 2015-04-11: 650 mg via ORAL

## 2015-04-11 MED ORDER — IMMUNE GLOBULIN (HUMAN) 20 GM/200ML IV SOLN
1.0000 g/kg | Freq: Once | INTRAVENOUS | Status: DC
  Filled 2015-04-11: qty 750

## 2015-04-11 MED ORDER — DIPHENHYDRAMINE HCL 25 MG PO TABS
25.0000 mg | ORAL_TABLET | Freq: Three times a day (TID) | ORAL | Status: DC | PRN
  Administered 2015-04-11: 25 mg via ORAL
  Filled 2015-04-11: qty 1

## 2015-04-11 MED ORDER — DIPHENHYDRAMINE HCL 25 MG PO CAPS
ORAL_CAPSULE | ORAL | Status: AC
  Filled 2015-04-11: qty 1

## 2015-04-11 MED ORDER — ACETAMINOPHEN 325 MG PO TABS
ORAL_TABLET | ORAL | Status: AC
  Filled 2015-04-11: qty 2

## 2015-04-11 MED ORDER — PRIVIGEN 10 GM/100ML IV SOLN
1.0000 g/kg | Freq: Once | INTRAVENOUS | Status: AC
  Administered 2015-04-11: 75 g via INTRAVENOUS
  Filled 2015-04-11: qty 750

## 2015-04-11 NOTE — Assessment & Plan Note (Signed)
She is not symptomatic. Per neurologist, will follow once a year with imaging.

## 2015-04-11 NOTE — Progress Notes (Signed)
[place] OFFICE PROGRESS NOTE  Patient Care Team: Concha Norway, MD as Consulting Physician (Internal Medicine) Unice Bailey, MD as Consulting Physician (Rheumatology)  SUMMARY OF ONCOLOGIC HISTORY: Oncology History   --2005.  Living in [place], [place]. Non-Hodgkin's lymphoma, follicular cell origin, grade 1, CD10 and CD20 positive presenting as a right submandibular mass. PET scan showed stage  III disease.    --September, 2005 - March 2006.  Received 8 cycles of Rituxan,fludarabine, mitoxantrone and Decadron.  --March, 2006 - March, 2008.  Received maintenance Rituxan x 2 years.    --05/02/2011.   PET scan c/w NED     --06/22/2012.  CT of neck, chest, abdomen and pelvis, c/w NED. The patient is not on any therapy.     History of non-Hodgkin's lymphoma    INTERVAL HISTORY: Please see below for problem oriented charting. She returns for further follow-up. She feels stronger and better. She does not elaborate further. She is upset that we did not draw a lipid panel. She assumed that her primary care doctor will take care of everything. The patient was not fasting. She denies recent infection. Her daughter is present.  REVIEW OF SYSTEMS:   Constitutional: Denies fevers, chills or abnormal weight loss Eyes: Denies blurriness of vision Ears, nose, mouth, throat, and face: Denies mucositis or sore throat Respiratory: Denies cough, dyspnea or wheezes Cardiovascular: Denies palpitation, chest discomfort or lower extremity swelling Gastrointestinal:  Denies nausea, heartburn or change in bowel habits Skin: Denies abnormal skin rashes Lymphatics: Denies new lymphadenopathy or easy bruising Neurological:Denies numbness, tingling or new weaknesses Behavioral/Psych: Mood is stable, no new changes  All other systems were reviewed with the patient and are negative.  I have reviewed the past medical history, past surgical history, social history and family history with  the patient and they are unchanged from previous note.  ALLERGIES:  has No Known Allergies.  MEDICATIONS:  Current Outpatient Prescriptions  Medication Sig Dispense Refill  . ALPRAZolam (XANAX) 0.5 MG tablet Take 1 tablet (0.5 mg total) by mouth 2 (two) times daily as needed for anxiety. 60 tablet 2  . Alum & Mag Hydroxide-Simeth (MAGIC MOUTHWASH) SOLN Take 5 mLs by mouth 3 (three) times daily as needed for mouth pain. 400 mL 0  . cetirizine (ZYRTEC) 10 MG tablet Take 1 tablet (10 mg total) by mouth daily. 90 tablet 3  . famotidine (PEPCID) 20 MG tablet Take 1 tablet (20 mg total) by mouth 2 (two) times daily. 99991111 tablet 3  . folic acid (FOLVITE) A999333 MCG tablet Take 400 mcg by mouth daily.    . hydrochlorothiazide (HYDRODIURIL) 12.5 MG tablet Take 1 tablet (12.5 mg total) by mouth daily. 90 tablet 3  . methotrexate (RHEUMATREX) 2.5 MG tablet Take 7.5 mg by mouth once a week.    Marland Kitchen PARoxetine (PAXIL) 20 MG tablet Take 1 tablet (20 mg total) by mouth every morning. 90 tablet 3  . tiZANidine (ZANAFLEX) 4 MG tablet Take 1 tablet (4 mg total) by mouth every 8 (eight) hours as needed. 180 tablet 3  . traMADol (ULTRAM) 50 MG tablet   0  . traZODone (DESYREL) 50 MG tablet Take 0.5-1 tablets (25-50 mg total) by mouth at bedtime as needed for sleep. 30 tablet 5   No current facility-administered medications for this visit.   Facility-Administered Medications Ordered in Other Visits  Medication Dose Route Frequency Provider Last Rate Last Dose  . acetaminophen (TYLENOL) tablet 650 mg  650 mg Oral Q6H PRN Bailee Metter,  MD      . diphenhydrAMINE (BENADRYL) tablet 25 mg  25 mg Oral Q8H PRN Heath Lark, MD   25 mg at 04/11/15 0957  . Immune Globulin 10% (OCTAGAM) IV infusion 75 g  1 g/kg Intravenous Once Heath Lark, MD        PHYSICAL EXAMINATION: ECOG PERFORMANCE STATUS: 0 - Asymptomatic  Filed Vitals:   04/11/15 0908  BP: 162/80  Pulse: 70  Temp: 98.2 F (36.8 C)  Resp: 18   Filed Weights    04/11/15 0908  Weight: 170 lb 4.8 oz (77.248 kg)    GENERAL:alert, no distress and comfortable SKIN: skin color, texture, turgor are normal, no rashes or significant lesions EYES: normal, Conjunctiva are pink and non-injected, sclera clear Musculoskeletal:no cyanosis of digits and no clubbing  NEURO: alert & oriented x 3 with fluent speech, no focal motor/sensory deficits  LABORATORY DATA:  I have reviewed the data as listed    Component Value Date/Time   NA 140 02/21/2015 1234   NA 139 12/02/2014 1529   K 4.1 02/21/2015 1234   K 4.2 12/02/2014 1529   CL 103 12/02/2014 1529   CL 103 06/22/2012 1045   CO2 25 02/21/2015 1234   CO2 30 12/02/2014 1529   GLUCOSE 103 02/21/2015 1234   GLUCOSE 99 12/02/2014 1529   GLUCOSE 92 06/22/2012 1045   BUN 15.4 02/21/2015 1234   BUN 24* 12/02/2014 1529   CREATININE 0.7 02/21/2015 1234   CREATININE 0.72 12/02/2014 1529   CALCIUM 9.7 02/21/2015 1234   CALCIUM 9.9 12/02/2014 1529   PROT 6.1* 02/21/2015 1234   PROT 6.0 12/02/2014 1529   ALBUMIN 4.2 02/21/2015 1234   ALBUMIN 4.4 12/02/2014 1529   AST 24 02/21/2015 1234   AST 27 12/02/2014 1529   ALT 28 02/21/2015 1234   ALT 35 12/02/2014 1529   ALKPHOS 72 02/21/2015 1234   ALKPHOS 83 12/02/2014 1529   BILITOT 0.93 02/21/2015 1234   BILITOT 0.8 12/02/2014 1529    No results found for: SPEP, UPEP  Lab Results  Component Value Date   WBC 5.5 04/11/2015   NEUTROABS 3.4 04/11/2015   HGB 12.9 04/11/2015   HCT 37.4 04/11/2015   MCV 86.6 04/11/2015   PLT 143* 04/11/2015      Chemistry      Component Value Date/Time   NA 140 02/21/2015 1234   NA 139 12/02/2014 1529   K 4.1 02/21/2015 1234   K 4.2 12/02/2014 1529   CL 103 12/02/2014 1529   CL 103 06/22/2012 1045   CO2 25 02/21/2015 1234   CO2 30 12/02/2014 1529   BUN 15.4 02/21/2015 1234   BUN 24* 12/02/2014 1529   CREATININE 0.7 02/21/2015 1234   CREATININE 0.72 12/02/2014 1529      Component Value Date/Time   CALCIUM 9.7  02/21/2015 1234   CALCIUM 9.9 12/02/2014 1529   ALKPHOS 72 02/21/2015 1234   ALKPHOS 83 12/02/2014 1529   AST 24 02/21/2015 1234   AST 27 12/02/2014 1529   ALT 28 02/21/2015 1234   ALT 35 12/02/2014 1529   BILITOT 0.93 02/21/2015 1234   BILITOT 0.8 12/02/2014 1529     ASSESSMENT & PLAN:  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 She have profound acquired panhypogammaglobulinemia. I recommend a trial of monthly IVIG infusion. The risks, benefits, side effects of treatment is explained to the patient and she agreed to proceed So far, she tolerated treatment without any side effects. She will  return here on a monthly basis for treatment. I will see her next year for further assessment.  History of non-Hodgkin's lymphoma Clinical examination and imaging study showed no evidence of lymphoma recurrence. She is reassured.    Essential hypertension she will continue current medical management. I recommend close follow-up with primary care doctor for medication adjustment.   Meningioma [facility]) She is not symptomatic. Per neurologist, will follow once a year with imaging.   Orders Placed This Encounter  Procedures  . CBC with Differential/Platelet    Standing Status: Future     Number of Occurrences:      Standing Expiration Date: 05/15/2016   All questions were answered. The patient knows to call the clinic with any problems, questions or concerns. No barriers to learning was detected. I spent 15 minutes counseling the patient face to face. The total time spent in the appointment was 20 minutes and more than 50% was on counseling and review of test results     [facility], [place], MD 04/11/2015 10:00 AM

## 2015-04-11 NOTE — Assessment & Plan Note (Signed)
Clinical examination and imaging study showed no evidence of lymphoma recurrence. She is reassured. 

## 2015-04-11 NOTE — Assessment & Plan Note (Signed)
She had history of recurrent infections. She have profound acquired panhypogammaglobulinemia. I recommend a trial of monthly IVIG infusion. The risks, benefits, side effects of treatment is explained to the patient and she agreed to proceed So far, she tolerated treatment without any side effects. She will return here on a monthly basis for treatment. I will see her next year for further assessment.

## 2015-04-11 NOTE — Patient Instructions (Signed)

## 2015-04-11 NOTE — Assessment & Plan Note (Signed)
she will continue current medical management. I recommend close follow-up with primary care doctor for medication adjustment.  

## 2015-04-21 ENCOUNTER — Other Ambulatory Visit: Payer: Self-pay | Admitting: Internal Medicine

## 2015-04-27 ENCOUNTER — Telehealth: Payer: Self-pay | Admitting: Hematology and Oncology

## 2015-04-27 NOTE — Telephone Encounter (Signed)
returned call and s.w. pt and confirmed all appts....pt ok and aware °

## 2015-05-11 ENCOUNTER — Ambulatory Visit (HOSPITAL_BASED_OUTPATIENT_CLINIC_OR_DEPARTMENT_OTHER): Payer: Medicare Other

## 2015-05-11 VITALS — BP 136/67 | HR 62 | Temp 98.6°F | Resp 16

## 2015-05-11 DIAGNOSIS — D801 Nonfamilial hypogammaglobulinemia: Secondary | ICD-10-CM | POA: Diagnosis not present

## 2015-05-11 MED ORDER — SODIUM CHLORIDE 0.9 % IV SOLN
INTRAVENOUS | Status: DC
  Administered 2015-05-11 (×2): via INTRAVENOUS

## 2015-05-11 MED ORDER — ACETAMINOPHEN 325 MG PO TABS
ORAL_TABLET | ORAL | Status: AC
  Filled 2015-05-11: qty 2

## 2015-05-11 MED ORDER — ACETAMINOPHEN 325 MG PO TABS
650.0000 mg | ORAL_TABLET | Freq: Four times a day (QID) | ORAL | Status: DC | PRN
  Administered 2015-05-11: 650 mg via ORAL

## 2015-05-11 MED ORDER — IMMUNE GLOBULIN (HUMAN) 20 GM/200ML IV SOLN
75.0000 g | Freq: Once | INTRAVENOUS | Status: AC
  Administered 2015-05-11: 75 g via INTRAVENOUS
  Filled 2015-05-11: qty 750

## 2015-05-11 MED ORDER — DIPHENHYDRAMINE HCL 25 MG PO TABS
25.0000 mg | ORAL_TABLET | Freq: Three times a day (TID) | ORAL | Status: DC | PRN
  Administered 2015-05-11: 25 mg via ORAL
  Filled 2015-05-11: qty 1

## 2015-05-11 MED ORDER — DIPHENHYDRAMINE HCL 25 MG PO CAPS
ORAL_CAPSULE | ORAL | Status: AC
  Filled 2015-05-11: qty 1

## 2015-05-11 NOTE — Patient Instructions (Signed)

## 2015-06-06 ENCOUNTER — Ambulatory Visit: Payer: Medicare Other | Admitting: Internal Medicine

## 2015-06-08 ENCOUNTER — Telehealth: Payer: Self-pay | Admitting: Hematology and Oncology

## 2015-06-08 ENCOUNTER — Ambulatory Visit (HOSPITAL_BASED_OUTPATIENT_CLINIC_OR_DEPARTMENT_OTHER): Payer: Medicare Other

## 2015-06-08 VITALS — BP 151/69 | HR 56 | Temp 97.9°F | Resp 16

## 2015-06-08 DIAGNOSIS — D801 Nonfamilial hypogammaglobulinemia: Secondary | ICD-10-CM

## 2015-06-08 MED ORDER — ACETAMINOPHEN 325 MG PO TABS
ORAL_TABLET | ORAL | Status: AC
  Filled 2015-06-08: qty 2

## 2015-06-08 MED ORDER — ACETAMINOPHEN 325 MG PO TABS
650.0000 mg | ORAL_TABLET | Freq: Four times a day (QID) | ORAL | Status: DC | PRN
  Administered 2015-06-08: 650 mg via ORAL

## 2015-06-08 MED ORDER — DIPHENHYDRAMINE HCL 25 MG PO CAPS
ORAL_CAPSULE | ORAL | Status: AC
Start: 2015-06-08 — End: 2015-06-08
  Filled 2015-06-08: qty 1

## 2015-06-08 MED ORDER — IMMUNE GLOBULIN (HUMAN) 20 GM/200ML IV SOLN
75.0000 g | Freq: Once | INTRAVENOUS | Status: AC
  Administered 2015-06-08: 75 g via INTRAVENOUS
  Filled 2015-06-08: qty 750

## 2015-06-08 MED ORDER — DIPHENHYDRAMINE HCL 25 MG PO TABS
25.0000 mg | ORAL_TABLET | Freq: Three times a day (TID) | ORAL | Status: DC | PRN
  Administered 2015-06-08: 25 mg via ORAL
  Filled 2015-06-08: qty 1

## 2015-06-08 NOTE — Telephone Encounter (Signed)
Patient requested change appt 4/13 to 04/11.  Lab@ 8:30 MD @ 9 Infusion @ 10.

## 2015-06-08 NOTE — Patient Instructions (Signed)

## 2015-07-11 ENCOUNTER — Ambulatory Visit (HOSPITAL_BASED_OUTPATIENT_CLINIC_OR_DEPARTMENT_OTHER): Payer: Medicare Other

## 2015-07-11 VITALS — BP 164/76 | HR 66 | Temp 98.6°F | Resp 20

## 2015-07-11 DIAGNOSIS — D801 Nonfamilial hypogammaglobulinemia: Secondary | ICD-10-CM | POA: Diagnosis not present

## 2015-07-11 MED ORDER — DIPHENHYDRAMINE HCL 25 MG PO CAPS
ORAL_CAPSULE | ORAL | Status: AC
  Filled 2015-07-11: qty 1

## 2015-07-11 MED ORDER — SODIUM CHLORIDE 0.9 % IV SOLN
INTRAVENOUS | Status: DC
  Administered 2015-07-11: 09:00:00 via INTRAVENOUS

## 2015-07-11 MED ORDER — ACETAMINOPHEN 325 MG PO TABS
ORAL_TABLET | ORAL | Status: AC
  Filled 2015-07-11: qty 2

## 2015-07-11 MED ORDER — IMMUNE GLOBULIN (HUMAN) 20 GM/200ML IV SOLN
75.0000 g | Freq: Once | INTRAVENOUS | Status: AC
  Administered 2015-07-11: 75 g via INTRAVENOUS
  Filled 2015-07-11: qty 150

## 2015-07-11 MED ORDER — ACETAMINOPHEN 325 MG PO TABS
650.0000 mg | ORAL_TABLET | Freq: Four times a day (QID) | ORAL | Status: DC | PRN
  Administered 2015-07-11: 650 mg via ORAL

## 2015-07-11 MED ORDER — DIPHENHYDRAMINE HCL 25 MG PO TABS
25.0000 mg | ORAL_TABLET | Freq: Three times a day (TID) | ORAL | Status: DC | PRN
  Administered 2015-07-11: 25 mg via ORAL
  Filled 2015-07-11: qty 1

## 2015-07-11 NOTE — Patient Instructions (Signed)

## 2015-07-17 ENCOUNTER — Telehealth: Payer: Self-pay | Admitting: *Deleted

## 2015-07-17 NOTE — Telephone Encounter (Signed)
Pt states she had a "debilitating reaction" to her infusion last week. Reports that she threw up, diarrhea, severe headache, rash all over body, terrible muscular pain and could not get warm for 24-48 hours after she got home. Took 3 antihistamines to get ride of rash- still has small spot on hand. Daughter wanted to take her to the ED last week, but patient refused. Wants to see Dr Alvy Bimler to discuss. Is feeling better at this point.

## 2015-07-18 ENCOUNTER — Encounter: Payer: Self-pay | Admitting: *Deleted

## 2015-07-18 ENCOUNTER — Other Ambulatory Visit: Payer: Self-pay | Admitting: Hematology and Oncology

## 2015-07-18 ENCOUNTER — Telehealth: Payer: Self-pay | Admitting: Hematology and Oncology

## 2015-07-18 NOTE — Telephone Encounter (Signed)
Placed POF to see her tomorrow

## 2015-07-18 NOTE — Telephone Encounter (Signed)
cld and spoke to pt and gave pt time & date of appt for 2/22

## 2015-07-19 ENCOUNTER — Encounter: Payer: Self-pay | Admitting: Hematology and Oncology

## 2015-07-19 ENCOUNTER — Ambulatory Visit (HOSPITAL_BASED_OUTPATIENT_CLINIC_OR_DEPARTMENT_OTHER): Payer: Medicare Other | Admitting: Hematology and Oncology

## 2015-07-19 VITALS — BP 140/72 | HR 63 | Temp 97.9°F | Resp 18 | Ht 64.0 in | Wt 168.3 lb

## 2015-07-19 DIAGNOSIS — R51 Headache: Secondary | ICD-10-CM

## 2015-07-19 DIAGNOSIS — Z8572 Personal history of non-Hodgkin lymphomas: Secondary | ICD-10-CM

## 2015-07-19 DIAGNOSIS — R519 Headache, unspecified: Secondary | ICD-10-CM

## 2015-07-19 DIAGNOSIS — D801 Nonfamilial hypogammaglobulinemia: Secondary | ICD-10-CM

## 2015-07-19 DIAGNOSIS — T8069XA Other serum reaction due to other serum, initial encounter: Secondary | ICD-10-CM

## 2015-07-19 NOTE — Assessment & Plan Note (Signed)
She has acute on chronic headache recently, improving on Excedrin I recommend she continue the same.

## 2015-07-19 NOTE — Assessment & Plan Note (Signed)
Clinical examination and imaging study showed no evidence of lymphoma recurrence. She is reassured. 

## 2015-07-19 NOTE — Assessment & Plan Note (Signed)
She had classic signs of serum sickness due to recent IVIG Most of the symptoms have resolved. I recommend we continue conservative management with hydration, Excedrin as needed for headache and Tylenol as needed for bone pain I would discontinue her treatment next month and reassess again in April

## 2015-07-19 NOTE — Progress Notes (Signed)
[place] OFFICE PROGRESS NOTE  Patient Care Team: Jonathon Jordan, MD as PCP - General (Family Medicine) Concha Norway, MD as Consulting Physician (Internal Medicine) Unice Bailey, MD as Consulting Physician (Rheumatology)  SUMMARY OF ONCOLOGIC HISTORY: Oncology History   --2005.  Living in [place], [place]. Non-Hodgkin's lymphoma, follicular cell origin, grade 1, CD10 and CD20 positive presenting as a right submandibular mass. PET scan showed stage  III disease.    --September, 2005 - March 2006.  Received 8 cycles of Rituxan,fludarabine, mitoxantrone and Decadron.  --March, 2006 - March, 2008.  Received maintenance Rituxan x 2 years.    --05/02/2011.   PET scan c/w NED     --06/22/2012.  CT of neck, chest, abdomen and pelvis, c/w NED. The patient is not on any therapy.     History of non-Hodgkin's lymphoma   She received 5 doses of IVIG from October 2016 to 07/11/2015 INTERVAL HISTORY: Please see below for problem oriented charting. She is seen urgently today because of recent delayed infusion reaction. After her recent treatment, she developed severe headaches, nausea or vomiting, bone pain, low-grade fever with chills and anorexia. Her headache was severe and caused visual changes. She was able to hydrate herself adequately. Overall, all her symptoms above have improved. She is able to resume regular diet the last 2 days and her headaches are improving. She felt that this is likely related to her last infusion of IVIG. The first 4 doses was uneventful and in fact improve her energy level overall  REVIEW OF SYSTEMS:   Ears, nose, mouth, throat, and face: Denies mucositis or sore throat Respiratory: Denies cough, dyspnea or wheezes Cardiovascular: Denies palpitation, chest discomfort or lower extremity swelling Skin: Denies abnormal skin rashes Lymphatics: Denies new lymphadenopathy or easy bruising Neurological:Denies numbness, tingling  Behavioral/Psych: Mood  is stable, no new changes  All other systems were reviewed with the patient and are negative.  I have reviewed the past medical history, past surgical history, social history and family history with the patient and they are unchanged from previous note.  ALLERGIES:  has No Known Allergies.  MEDICATIONS:  Current Outpatient Prescriptions  Medication Sig Dispense Refill  . ALPRAZolam (XANAX) 0.5 MG tablet Take 1 tablet (0.5 mg total) by mouth 2 (two) times daily as needed for anxiety. 60 tablet 2  . Alum & Mag Hydroxide-Simeth (MAGIC MOUTHWASH) SOLN Take 5 mLs by mouth 3 (three) times daily as needed for mouth pain. 400 mL 0  . amLODipine (NORVASC) 10 MG tablet TK 1 T PO QD  4  . cetirizine (ZYRTEC) 10 MG tablet Take 1 tablet (10 mg total) by mouth daily. 90 tablet 3  . famotidine (PEPCID) 20 MG tablet Take 1 tablet (20 mg total) by mouth 2 (two) times daily. 99991111 tablet 3  . folic acid (FOLVITE) A999333 MCG tablet Take 400 mcg by mouth daily.    . hydrochlorothiazide (HYDRODIURIL) 12.5 MG tablet Take 1 tablet (12.5 mg total) by mouth daily. 90 tablet 3  . methotrexate (RHEUMATREX) 2.5 MG tablet Take 7.5 mg by mouth once a week.    Marland Kitchen PARoxetine (PAXIL) 20 MG tablet Take 1 tablet (20 mg total) by mouth every morning. 90 tablet 3  . tiZANidine (ZANAFLEX) 4 MG tablet Take 1 tablet (4 mg total) by mouth every 8 (eight) hours as needed. 180 tablet 3  . traMADol (ULTRAM) 50 MG tablet   0  . traZODone (DESYREL) 50 MG tablet Take 0.5-1 tablets (25-50 mg total) by  mouth at bedtime as needed for sleep. 30 tablet 5  . RESTASIS 0.05 % ophthalmic emulsion   2   No current facility-administered medications for this visit.    PHYSICAL EXAMINATION: ECOG PERFORMANCE STATUS: 1 - Symptomatic but completely ambulatory  Filed Vitals:   07/19/15 1132  BP: 140/72  Pulse: 63  Temp: 97.9 F (36.6 C)  Resp: 18   Filed Weights   07/19/15 1132  Weight: 168 lb 4.8 oz (76.34 kg)    GENERAL:alert, no distress  and comfortable SKIN: skin color, texture, turgor are normal, no rashes or significant lesions EYES: normal, Conjunctiva are pink and non-injected, sclera clear OROPHARYNX:no exudate, no erythema and lips, buccal mucosa, and tongue normal  NECK: supple, thyroid normal size, non-tender, without nodularity LYMPH:  no palpable lymphadenopathy in the cervical, axillary or inguinal LUNGS: clear to auscultation and percussion with normal breathing effort HEART: regular rate & rhythm and no murmurs and no lower extremity edema ABDOMEN:abdomen soft, non-tender and normal bowel sounds Musculoskeletal:no cyanosis of digits and no clubbing  NEURO: alert & oriented x 3 with fluent speech, no focal motor/sensory deficits  LABORATORY DATA:  I have reviewed the data as listed    Component Value Date/Time   NA 140 02/21/2015 1234   NA 139 12/02/2014 1529   K 4.1 02/21/2015 1234   K 4.2 12/02/2014 1529   CL 103 12/02/2014 1529   CL 103 06/22/2012 1045   CO2 25 02/21/2015 1234   CO2 30 12/02/2014 1529   GLUCOSE 103 02/21/2015 1234   GLUCOSE 99 12/02/2014 1529   GLUCOSE 92 06/22/2012 1045   BUN 15.4 02/21/2015 1234   BUN 24* 12/02/2014 1529   CREATININE 0.7 02/21/2015 1234   CREATININE 0.72 12/02/2014 1529   CALCIUM 9.7 02/21/2015 1234   CALCIUM 9.9 12/02/2014 1529   PROT 6.1* 02/21/2015 1234   PROT 6.0 12/02/2014 1529   ALBUMIN 4.2 02/21/2015 1234   ALBUMIN 4.4 12/02/2014 1529   AST 24 02/21/2015 1234   AST 27 12/02/2014 1529   ALT 28 02/21/2015 1234   ALT 35 12/02/2014 1529   ALKPHOS 72 02/21/2015 1234   ALKPHOS 83 12/02/2014 1529   BILITOT 0.93 02/21/2015 1234   BILITOT 0.8 12/02/2014 1529    No results found for: SPEP, UPEP  Lab Results  Component Value Date   WBC 5.5 04/11/2015   NEUTROABS 3.4 04/11/2015   HGB 12.9 04/11/2015   HCT 37.4 04/11/2015   MCV 86.6 04/11/2015   PLT 143* 04/11/2015      Chemistry      Component Value Date/Time   NA 140 02/21/2015 1234   NA  139 12/02/2014 1529   K 4.1 02/21/2015 1234   K 4.2 12/02/2014 1529   CL 103 12/02/2014 1529   CL 103 06/22/2012 1045   CO2 25 02/21/2015 1234   CO2 30 12/02/2014 1529   BUN 15.4 02/21/2015 1234   BUN 24* 12/02/2014 1529   CREATININE 0.7 02/21/2015 1234   CREATININE 0.72 12/02/2014 1529      Component Value Date/Time   CALCIUM 9.7 02/21/2015 1234   CALCIUM 9.9 12/02/2014 1529   ALKPHOS 72 02/21/2015 1234   ALKPHOS 83 12/02/2014 1529   AST 24 02/21/2015 1234   AST 27 12/02/2014 1529   ALT 28 02/21/2015 1234   ALT 35 12/02/2014 1529   BILITOT 0.93 02/21/2015 1234   BILITOT 0.8 12/02/2014 1529      ASSESSMENT & PLAN:  History of non-Hodgkin's lymphoma Clinical examination  and imaging study showed no evidence of lymphoma recurrence. She is reassured.    Serum sickness due to drug She had classic signs of serum sickness due to recent IVIG Most of the symptoms have resolved. I recommend we continue conservative management with hydration, Excedrin as needed for headache and Tylenol as needed for bone pain I would discontinue her treatment next month and reassess again in April  Chronic headache disorder She has acute on chronic headache recently, improving on Excedrin I recommend she continue the same.   Orders Placed This Encounter  Procedures  . IgG, IgA, IgM    Standing Status: Future     Number of Occurrences:      Standing Expiration Date: 08/22/2016   All questions were answered. The patient knows to call the clinic with any problems, questions or concerns. No barriers to learning was detected. I spent 15 minutes counseling the patient face to face. The total time spent in the appointment was 20 minutes and more than 50% was on counseling and review of test results     [facility], [place], MD 07/19/2015 2:41 PM

## 2015-08-08 ENCOUNTER — Ambulatory Visit: Payer: Medicare Other

## 2015-08-30 ENCOUNTER — Other Ambulatory Visit: Payer: Self-pay

## 2015-08-30 DIAGNOSIS — Z1231 Encounter for screening mammogram for malignant neoplasm of breast: Secondary | ICD-10-CM

## 2015-09-05 ENCOUNTER — Telehealth: Payer: Self-pay | Admitting: Hematology and Oncology

## 2015-09-05 ENCOUNTER — Ambulatory Visit: Payer: Medicare Other

## 2015-09-05 ENCOUNTER — Encounter: Payer: Self-pay | Admitting: Hematology and Oncology

## 2015-09-05 ENCOUNTER — Ambulatory Visit (HOSPITAL_BASED_OUTPATIENT_CLINIC_OR_DEPARTMENT_OTHER): Payer: Medicare Other | Admitting: Hematology and Oncology

## 2015-09-05 ENCOUNTER — Other Ambulatory Visit (HOSPITAL_BASED_OUTPATIENT_CLINIC_OR_DEPARTMENT_OTHER): Payer: Medicare Other

## 2015-09-05 VITALS — BP 125/66 | HR 66 | Temp 98.1°F | Resp 18 | Ht 64.0 in | Wt 173.1 lb

## 2015-09-05 DIAGNOSIS — Z8572 Personal history of non-Hodgkin lymphomas: Secondary | ICD-10-CM

## 2015-09-05 DIAGNOSIS — D801 Nonfamilial hypogammaglobulinemia: Secondary | ICD-10-CM

## 2015-09-05 LAB — CBC WITH DIFFERENTIAL/PLATELET
BASO%: 1.8 % (ref 0.0–2.0)
Basophils Absolute: 0.1 10*3/uL (ref 0.0–0.1)
EOS%: 4.5 % (ref 0.0–7.0)
Eosinophils Absolute: 0.3 10*3/uL (ref 0.0–0.5)
HEMATOCRIT: 39.2 % (ref 34.8–46.6)
HEMOGLOBIN: 13.2 g/dL (ref 11.6–15.9)
LYMPH#: 1.7 10*3/uL (ref 0.9–3.3)
LYMPH%: 24.4 % (ref 14.0–49.7)
MCH: 28.9 pg (ref 25.1–34.0)
MCHC: 33.7 g/dL (ref 31.5–36.0)
MCV: 85.7 fL (ref 79.5–101.0)
MONO#: 0.6 10*3/uL (ref 0.1–0.9)
MONO%: 8.3 % (ref 0.0–14.0)
NEUT%: 61 % (ref 38.4–76.8)
NEUTROS ABS: 4.3 10*3/uL (ref 1.5–6.5)
PLATELETS: 150 10*3/uL (ref 145–400)
RBC: 4.57 10*6/uL (ref 3.70–5.45)
RDW: 16.2 % — AB (ref 11.2–14.5)
WBC: 7.1 10*3/uL (ref 3.9–10.3)

## 2015-09-05 NOTE — Telephone Encounter (Signed)
lvm for pt regarding to OCT appt..... °

## 2015-09-05 NOTE — Assessment & Plan Note (Addendum)
Clinical examination and imaging study in 2016 showed no evidence of lymphoma recurrence. She is reassured.

## 2015-09-05 NOTE — Assessment & Plan Note (Signed)
She had history of recurrent infections. She have profound acquired panhypogammaglobulinemia. I recommend a trial of monthly IVIG infusion. The risks, benefits, side effects of treatment is explained to the patient and she agreed to proceed Since we started treatment, she started to have symptoms to suggest serum sickness. She has no further infections. She wants to discontinue IVIG treatment which I think is reasonable. I plan to see her back in 6 months with repeat blood work, history and physical examination.

## 2015-09-05 NOTE — Progress Notes (Signed)
[facility] OFFICE PROGRESS NOTE  Patient Care Team: Jonathon Jordan, MD as PCP - General (Family Medicine) Unice Bailey, MD as Consulting Physician (Rheumatology) Heath Lark, MD as Consulting Physician (Hematology and Oncology)  SUMMARY OF ONCOLOGIC HISTORY: Oncology History   --2005.  Living in [place], [place]. Non-Hodgkin's lymphoma, follicular cell origin, grade 1, CD10 and CD20 positive presenting as a right submandibular mass. PET scan showed stage  III disease.    --September, 2005 - March 2006.  Received 8 cycles of Rituxan,fludarabine, mitoxantrone and Decadron.  --March, 2006 - March, 2008.  Received maintenance Rituxan x 2 years.    --05/02/2011.   PET scan c/w NED     --06/22/2012.  CT of neck, chest, abdomen and pelvis, c/w NED. The patient is not on any therapy.     History of non-Hodgkin's lymphoma   She received IVIG from 03/14/2015 to 07/11/2015 for acquire pan-hypogammaglobulinemia. INTERVAL HISTORY: Please see below for problem oriented charting. She complained of symptoms of serum sickness with last IVIG. She has no further infection since we started her on IVIG treatment She wants to stop treatment She has no new lymphadenopathy or complaints today  REVIEW OF SYSTEMS:   Constitutional: Denies fevers, chills or abnormal weight loss Eyes: Denies blurriness of vision Ears, nose, mouth, throat, and face: Denies mucositis or sore throat Respiratory: Denies cough, dyspnea or wheezes Cardiovascular: Denies palpitation, chest discomfort or lower extremity swelling Gastrointestinal:  Denies nausea, heartburn or change in bowel habits Skin: Denies abnormal skin rashes Lymphatics: Denies new lymphadenopathy or easy bruising Neurological:Denies numbness, tingling or new weaknesses Behavioral/Psych: Mood is stable, no new changes  All other systems were reviewed with the patient and are negative.  I have reviewed the past medical history, past surgical  history, social history and family history with the patient and they are unchanged from previous note.  ALLERGIES:  has No Known Allergies.  MEDICATIONS:  Current Outpatient Prescriptions  Medication Sig Dispense Refill  . ALPRAZolam (XANAX) 0.5 MG tablet Take 1 tablet (0.5 mg total) by mouth 2 (two) times daily as needed for anxiety. 60 tablet 2  . Alum & Mag Hydroxide-Simeth (MAGIC MOUTHWASH) SOLN Take 5 mLs by mouth 3 (three) times daily as needed for mouth pain. 400 mL 0  . amLODipine (NORVASC) 10 MG tablet TK 1 T PO QD  4  . cetirizine (ZYRTEC) 10 MG tablet Take 1 tablet (10 mg total) by mouth daily. 90 tablet 3  . famotidine (PEPCID) 20 MG tablet Take 1 tablet (20 mg total) by mouth 2 (two) times daily. 99991111 tablet 3  . folic acid (FOLVITE) A999333 MCG tablet Take 400 mcg by mouth daily.    . hydrochlorothiazide (HYDRODIURIL) 12.5 MG tablet Take 1 tablet (12.5 mg total) by mouth daily. 90 tablet 3  . methotrexate (RHEUMATREX) 2.5 MG tablet Take 7.5 mg by mouth once a week.    Marland Kitchen PARoxetine (PAXIL) 20 MG tablet Take 1 tablet (20 mg total) by mouth every morning. 90 tablet 3  . RESTASIS 0.05 % ophthalmic emulsion   2  . tiZANidine (ZANAFLEX) 4 MG tablet Take 1 tablet (4 mg total) by mouth every 8 (eight) hours as needed. 180 tablet 3  . traMADol (ULTRAM) 50 MG tablet   0  . traZODone (DESYREL) 50 MG tablet Take 0.5-1 tablets (25-50 mg total) by mouth at bedtime as needed for sleep. 30 tablet 5   No current facility-administered medications for this visit.    PHYSICAL EXAMINATION: ECOG  PERFORMANCE STATUS: 0 - Asymptomatic  Filed Vitals:   09/05/15 0908  BP: 125/66  Pulse: 66  Temp: 98.1 F (36.7 C)  Resp: 18   Filed Weights   09/05/15 0908  Weight: 173 lb 1.6 oz (78.518 kg)    GENERAL:alert, no distress and comfortable SKIN: skin color, texture, turgor are normal, no rashes or significant lesions EYES: normal, Conjunctiva are pink and non-injected, sclera  clear Musculoskeletal:no cyanosis of digits and no clubbing  NEURO: alert & oriented x 3 with fluent speech, no focal motor/sensory deficits  LABORATORY DATA:  I have reviewed the data as listed    Component Value Date/Time   NA 140 02/21/2015 1234   NA 139 12/02/2014 1529   K 4.1 02/21/2015 1234   K 4.2 12/02/2014 1529   CL 103 12/02/2014 1529   CL 103 06/22/2012 1045   CO2 25 02/21/2015 1234   CO2 30 12/02/2014 1529   GLUCOSE 103 02/21/2015 1234   GLUCOSE 99 12/02/2014 1529   GLUCOSE 92 06/22/2012 1045   BUN 15.4 02/21/2015 1234   BUN 24* 12/02/2014 1529   CREATININE 0.7 02/21/2015 1234   CREATININE 0.72 12/02/2014 1529   CALCIUM 9.7 02/21/2015 1234   CALCIUM 9.9 12/02/2014 1529   PROT 6.1* 02/21/2015 1234   PROT 6.0 12/02/2014 1529   ALBUMIN 4.2 02/21/2015 1234   ALBUMIN 4.4 12/02/2014 1529   AST 24 02/21/2015 1234   AST 27 12/02/2014 1529   ALT 28 02/21/2015 1234   ALT 35 12/02/2014 1529   ALKPHOS 72 02/21/2015 1234   ALKPHOS 83 12/02/2014 1529   BILITOT 0.93 02/21/2015 1234   BILITOT 0.8 12/02/2014 1529    No results found for: SPEP, UPEP  Lab Results  Component Value Date   WBC 7.1 09/05/2015   NEUTROABS 4.3 09/05/2015   HGB 13.2 09/05/2015   HCT 39.2 09/05/2015   MCV 85.7 09/05/2015   PLT 150 09/05/2015      Chemistry      Component Value Date/Time   NA 140 02/21/2015 1234   NA 139 12/02/2014 1529   K 4.1 02/21/2015 1234   K 4.2 12/02/2014 1529   CL 103 12/02/2014 1529   CL 103 06/22/2012 1045   CO2 25 02/21/2015 1234   CO2 30 12/02/2014 1529   BUN 15.4 02/21/2015 1234   BUN 24* 12/02/2014 1529   CREATININE 0.7 02/21/2015 1234   CREATININE 0.72 12/02/2014 1529      Component Value Date/Time   CALCIUM 9.7 02/21/2015 1234   CALCIUM 9.9 12/02/2014 1529   ALKPHOS 72 02/21/2015 1234   ALKPHOS 83 12/02/2014 1529   AST 24 02/21/2015 1234   AST 27 12/02/2014 1529   ALT 28 02/21/2015 1234   ALT 35 12/02/2014 1529   BILITOT 0.93 02/21/2015 1234    BILITOT 0.8 12/02/2014 1529     ASSESSMENT & PLAN:  History of non-Hodgkin's lymphoma Clinical examination and imaging study in 2016 showed no evidence of lymphoma recurrence. She is reassured.    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 She have profound acquired panhypogammaglobulinemia. I recommend a trial of monthly IVIG infusion. The risks, benefits, side effects of treatment is explained to the patient and she agreed to proceed Since we started treatment, she started to have symptoms to suggest serum sickness. She has no further infections. She wants to discontinue IVIG treatment which I think is reasonable. I plan to see her back in 6 months with repeat blood work, history and  physical examination.   Orders Placed This Encounter  Procedures  . CBC with Differential/Platelet    Standing Status: Future     Number of Occurrences:      Standing Expiration Date: 10/09/2016  . IgG, IgA, IgM    Standing Status: Future     Number of Occurrences:      Standing Expiration Date: 10/09/2016   All questions were answered. The patient knows to call the clinic with any problems, questions or concerns. No barriers to learning was detected. I spent 10 minutes counseling the patient face to face. The total time spent in the appointment was 15 minutes and more than 50% was on counseling and review of test results     [facility], Kari Kerth, MD 09/05/2015 9:12 AM

## 2015-09-06 ENCOUNTER — Telehealth: Payer: Self-pay | Admitting: *Deleted

## 2015-09-06 LAB — IGG, IGA, IGM
IGA/IMMUNOGLOBULIN A, SERUM: 6 mg/dL — AB (ref 64–422)
IGG (IMMUNOGLOBIN G), SERUM: 493 mg/dL — AB (ref 700–1600)
IgM, Qn, Serum: 10 mg/dL — ABNORMAL LOW (ref 26–217)

## 2015-09-06 NOTE — Telephone Encounter (Signed)
-----   Message from Heath Lark, MD sent at 09/06/2015  7:29 AM EDT ----- Regarding: lab results Please let her know immunoglobulin levels are still low but it's still OK to stop IVIG treatment ----- Message -----    From: [facility] Interface    Sent: 09/05/2015   8:58 AM      To: Heath Lark, MD

## 2015-09-06 NOTE — Telephone Encounter (Signed)
LM with note below 

## 2015-09-07 ENCOUNTER — Ambulatory Visit: Payer: Medicare Other

## 2015-09-07 ENCOUNTER — Other Ambulatory Visit: Payer: Medicare Other

## 2015-09-07 ENCOUNTER — Ambulatory Visit: Payer: Medicare Other | Admitting: Hematology and Oncology

## 2015-09-19 ENCOUNTER — Ambulatory Visit
Admission: RE | Admit: 2015-09-19 | Discharge: 2015-09-19 | Disposition: A | Discharge status: Home / Self Care | Payer: Medicare Other | Source: Ambulatory Visit

## 2015-09-19 DIAGNOSIS — Z1231 Encounter for screening mammogram for malignant neoplasm of breast: Secondary | ICD-10-CM

## 2015-12-29 ENCOUNTER — Other Ambulatory Visit: Payer: Self-pay

## 2015-12-29 ENCOUNTER — Telehealth: Payer: Self-pay

## 2015-12-29 NOTE — Telephone Encounter (Signed)
PLs call her on Monday I have no opening Monday or Tuesday If she needs to be seen she has to see Cyndee or PCP

## 2015-12-29 NOTE — Telephone Encounter (Signed)
Called pt again, no answer.

## 2015-12-29 NOTE — Telephone Encounter (Signed)
Called pt re: [facility] call, no answer, left pt message letting her know we'd like to see her, encouraged her to call back to schedule an appt

## 2016-01-01 NOTE — Telephone Encounter (Signed)
Called pt again, left vm stating that patient can call back to schedule an appt is she is still not feeling well

## 2016-03-05 ENCOUNTER — Encounter: Payer: Self-pay | Admitting: Hematology and Oncology

## 2016-03-05 ENCOUNTER — Ambulatory Visit (HOSPITAL_BASED_OUTPATIENT_CLINIC_OR_DEPARTMENT_OTHER): Payer: Medicare Other | Admitting: Hematology and Oncology

## 2016-03-05 ENCOUNTER — Other Ambulatory Visit (HOSPITAL_BASED_OUTPATIENT_CLINIC_OR_DEPARTMENT_OTHER): Payer: Medicare Other

## 2016-03-05 VITALS — BP 152/74 | HR 70 | Temp 98.7°F | Resp 18 | Wt 171.2 lb

## 2016-03-05 DIAGNOSIS — D801 Nonfamilial hypogammaglobulinemia: Secondary | ICD-10-CM

## 2016-03-05 DIAGNOSIS — Z8572 Personal history of non-Hodgkin lymphomas: Secondary | ICD-10-CM

## 2016-03-05 DIAGNOSIS — D329 Benign neoplasm of meninges, unspecified: Secondary | ICD-10-CM

## 2016-03-05 LAB — CBC WITH DIFFERENTIAL/PLATELET
BASO%: 1 % (ref 0.0–2.0)
Basophils Absolute: 0.1 10*3/uL (ref 0.0–0.1)
EOS%: 5.9 % (ref 0.0–7.0)
Eosinophils Absolute: 0.5 10*3/uL (ref 0.0–0.5)
HEMATOCRIT: 38.1 % (ref 34.8–46.6)
HGB: 13.1 g/dL (ref 11.6–15.9)
LYMPH#: 1.2 10*3/uL (ref 0.9–3.3)
LYMPH%: 15.4 % (ref 14.0–49.7)
MCH: 29.8 pg (ref 25.1–34.0)
MCHC: 34.2 g/dL (ref 31.5–36.0)
MCV: 87 fL (ref 79.5–101.0)
MONO#: 0.7 10*3/uL (ref 0.1–0.9)
MONO%: 9.1 % (ref 0.0–14.0)
NEUT%: 68.6 % (ref 38.4–76.8)
NEUTROS ABS: 5.5 10*3/uL (ref 1.5–6.5)
PLATELETS: 174 10*3/uL (ref 145–400)
RBC: 4.39 10*6/uL (ref 3.70–5.45)
RDW: 15 % — AB (ref 11.2–14.5)
WBC: 8.1 10*3/uL (ref 3.9–10.3)

## 2016-03-05 NOTE — Progress Notes (Signed)
[facility] OFFICE PROGRESS NOTE  Patient Care Team: Jonathon Jordan, MD as PCP - General (Family Medicine) Unice Bailey, MD as Consulting Physician (Rheumatology) Heath Lark, MD as Consulting Physician (Hematology and Oncology)  SUMMARY OF ONCOLOGIC HISTORY: Oncology History   --2005.  Living in [place], [place]. Non-Hodgkin's lymphoma, follicular cell origin, grade 1, CD10 and CD20 positive presenting as a right submandibular mass. PET scan showed stage  III disease.    --September, 2005 - March 2006.  Received 8 cycles of Rituxan,fludarabine, mitoxantrone and Decadron.  --March, 2006 - March, 2008.  Received maintenance Rituxan x 2 years.    --05/02/2011.   PET scan c/w NED     --06/22/2012.  CT of neck, chest, abdomen and pelvis, c/w NED. The patient is not on any therapy.     History of non-Hodgkin's lymphoma   She received monthly IVIG from October 2016 to therapy 2017 to prevent infection. Treatment was discontinued due to serum sickness  INTERVAL HISTORY: Please see below for problem oriented charting. She denies recent infection. No new lymphadenopathy. She has occasional headaches but denies neurological deficits  REVIEW OF SYSTEMS:   Constitutional: Denies fevers, chills or abnormal weight loss Eyes: Denies blurriness of vision Ears, nose, mouth, throat, and face: Denies mucositis or sore throat Respiratory: Denies cough, dyspnea or wheezes Cardiovascular: Denies palpitation, chest discomfort or lower extremity swelling Gastrointestinal:  Denies nausea, heartburn or change in bowel habits Skin: Denies abnormal skin rashes Lymphatics: Denies new lymphadenopathy or easy bruising Neurological:Denies numbness, tingling or new weaknesses Behavioral/Psych: Mood is stable, no new changes  All other systems were reviewed with the patient and are negative.  I have reviewed the past medical history, past surgical history, social history and family history with  the patient and they are unchanged from previous note.  ALLERGIES:  has No Known Allergies.  MEDICATIONS:  Current Outpatient Prescriptions  Medication Sig Dispense Refill  . ALPRAZolam (XANAX) 0.5 MG tablet Take 1 tablet (0.5 mg total) by mouth 2 (two) times daily as needed for anxiety. 60 tablet 2  . Alum & Mag Hydroxide-Simeth (MAGIC MOUTHWASH) SOLN Take 5 mLs by mouth 3 (three) times daily as needed for mouth pain. 400 mL 0  . amLODipine (NORVASC) 10 MG tablet TK 1 T PO QD  4  . cetirizine (ZYRTEC) 10 MG tablet Take 1 tablet (10 mg total) by mouth daily. 90 tablet 3  . famotidine (PEPCID) 20 MG tablet Take 1 tablet (20 mg total) by mouth 2 (two) times daily. 99991111 tablet 3  . folic acid (FOLVITE) A999333 MCG tablet Take 400 mcg by mouth daily.    . hydrochlorothiazide (HYDRODIURIL) 12.5 MG tablet Take 1 tablet (12.5 mg total) by mouth daily. 90 tablet 3  . methotrexate (RHEUMATREX) 2.5 MG tablet Take 10 mg by mouth once a week. Tuesdays    . RESTASIS 0.05 % ophthalmic emulsion   2  . tiZANidine (ZANAFLEX) 4 MG tablet Take 1 tablet (4 mg total) by mouth every 8 (eight) hours as needed. 180 tablet 3  . traMADol (ULTRAM) 50 MG tablet Take 100 mg by mouth every 6 (six) hours as needed.   0  . traZODone (DESYREL) 50 MG tablet Take 0.5-1 tablets (25-50 mg total) by mouth at bedtime as needed for sleep. 30 tablet 5  . PARoxetine (PAXIL) 20 MG tablet Take 1 tablet (20 mg total) by mouth every morning. 90 tablet 3   No current facility-administered medications for this visit.  PHYSICAL EXAMINATION: ECOG PERFORMANCE STATUS: 1 - Symptomatic but completely ambulatory  Vitals:   03/05/16 1141  BP: (!) 152/74  Pulse: 70  Resp: 18  Temp: 98.7 F (37.1 C)   Filed Weights   03/05/16 1141  Weight: 171 lb 3.2 oz (77.7 kg)    GENERAL:alert, no distress and comfortable SKIN: skin color, texture, turgor are normal, no rashes or significant lesions EYES: normal, Conjunctiva are pink and  non-injected, sclera clear OROPHARYNX:no exudate, no erythema and lips, buccal mucosa, and tongue normal  NECK: supple, thyroid normal size, non-tender, without nodularity LYMPH:  no palpable lymphadenopathy in the cervical, axillary or inguinal LUNGS: clear to auscultation and percussion with normal breathing effort HEART: regular rate & rhythm and no murmurs and no lower extremity edema ABDOMEN:abdomen soft, non-tender and normal bowel sounds Musculoskeletal:no cyanosis of digits and no clubbing  NEURO: alert & oriented x 3 with fluent speech, no focal motor/sensory deficits  LABORATORY DATA:  I have reviewed the data as listed    Component Value Date/Time   NA 140 02/21/2015 1234   K 4.1 02/21/2015 1234   CL 103 12/02/2014 1529   CL 103 06/22/2012 1045   CO2 25 02/21/2015 1234   GLUCOSE 103 02/21/2015 1234   GLUCOSE 92 06/22/2012 1045   BUN 15.4 02/21/2015 1234   CREATININE 0.7 02/21/2015 1234   CALCIUM 9.7 02/21/2015 1234   PROT 6.1 (L) 02/21/2015 1234   ALBUMIN 4.2 02/21/2015 1234   AST 24 02/21/2015 1234   ALT 28 02/21/2015 1234   ALKPHOS 72 02/21/2015 1234   BILITOT 0.93 02/21/2015 1234    No results found for: SPEP, UPEP  Lab Results  Component Value Date   WBC 8.1 03/05/2016   NEUTROABS 5.5 03/05/2016   HGB 13.1 03/05/2016   HCT 38.1 03/05/2016   MCV 87.0 03/05/2016   PLT 174 03/05/2016      Chemistry      Component Value Date/Time   NA 140 02/21/2015 1234   K 4.1 02/21/2015 1234   CL 103 12/02/2014 1529   CL 103 06/22/2012 1045   CO2 25 02/21/2015 1234   BUN 15.4 02/21/2015 1234   CREATININE 0.7 02/21/2015 1234      Component Value Date/Time   CALCIUM 9.7 02/21/2015 1234   ALKPHOS 72 02/21/2015 1234   AST 24 02/21/2015 1234   ALT 28 02/21/2015 1234   BILITOT 0.93 02/21/2015 1234      ASSESSMENT & PLAN:  History of non-Hodgkin's lymphoma Clinical examination and recent blood work showed no evidence of lymphoma recurrence. She is  reassured.  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 She have profound acquired panhypogammaglobulinemia. I recommend a trial of monthly IVIG infusion. Since we started treatment, she started to have symptoms to suggest serum sickness. She has no further infections.  We discontinued IVIG and she continues to feel well I plan to see her back in 7 months with repeat blood work, history and physical examination.   Orders Placed This Encounter  Procedures  . Comprehensive metabolic panel    Standing Status:   Future    Standing Expiration Date:   04/09/2017  . CBC with Differential/Platelet    Standing Status:   Future    Standing Expiration Date:   04/09/2017  . IgG, IgA, IgM    Standing Status:   Future    Standing  Expiration Date:   04/09/2017   All questions were answered. The patient knows to call the clinic with any problems, questions or concerns. No barriers to learning was detected. I spent 15 minutes counseling the patient face to face. The total time spent in the appointment was 20 minutes and more than 50% was on counseling and review of test results     Heath Lark, MD 03/05/2016 12:25 PM

## 2016-03-05 NOTE — Assessment & Plan Note (Addendum)
Clinical examination and recent blood work showed no evidence of lymphoma recurrence. She is reassured. 

## 2016-03-05 NOTE — Assessment & Plan Note (Deleted)
She is not symptomatic except for occasional headaches Will defer to her neurologist for further management 

## 2016-03-05 NOTE — Assessment & Plan Note (Signed)
She is not symptomatic except for occasional headaches Will defer to her neurologist for further management 

## 2016-03-05 NOTE — Assessment & Plan Note (Signed)
She had history of recurrent infections. She have profound acquired panhypogammaglobulinemia. I recommend a trial of monthly IVIG infusion. Since we started treatment, she started to have symptoms to suggest serum sickness. She has no further infections.  We discontinued IVIG and she continues to feel well I plan to see her back in 7 months with repeat blood work, history and physical examination.

## 2016-03-06 ENCOUNTER — Telehealth: Payer: Self-pay | Admitting: *Deleted

## 2016-03-06 LAB — IGG, IGA, IGM
IGG (IMMUNOGLOBIN G), SERUM: 178 mg/dL — AB (ref 700–1600)
IGM (IMMUNOGLOBIN M), SRM: 17 mg/dL — AB (ref 26–217)

## 2016-03-06 NOTE — Telephone Encounter (Signed)
-----   Message from Heath Lark, MD sent at 03/06/2016  6:57 AM EDT ----- Regarding: labs Pls let her know immunoglobulins levels are low as predicted No change in recommendations ----- Message ----- From: Interface, [facility] Sent: 03/05/2016  11:34 AM To: Heath Lark, MD

## 2016-03-06 NOTE — Telephone Encounter (Signed)
Informed pt of Dr. Gorsuch's message. She verbalized understanding.  

## 2016-03-27 ENCOUNTER — Telehealth: Payer: Self-pay | Admitting: General Practice

## 2016-03-27 NOTE — Telephone Encounter (Signed)
Spoke with patient confirmed appointment for 09/2016.

## 2016-03-28 ENCOUNTER — Other Ambulatory Visit: Payer: Self-pay | Admitting: Family Medicine

## 2016-03-28 DIAGNOSIS — R1011 Right upper quadrant pain: Secondary | ICD-10-CM

## 2016-03-29 ENCOUNTER — Other Ambulatory Visit: Payer: Medicare Other

## 2016-04-17 ENCOUNTER — Telehealth: Payer: Self-pay | Admitting: Neurology

## 2016-04-17 NOTE — Telephone Encounter (Signed)
Belinda Mcdowell, we had discussed repeating MRI of the brain with patient in one year, Repeat MRI of the brain to follow her meningioma and hemangioma seen on MRI last year. Would she like [place] to order? Please discuss with patient and let me know thanks

## 2016-04-22 NOTE — Telephone Encounter (Signed)
LVM relaying AA,MD message below. Asked her to call back and let us know how she would like to proceed. Gave [facility] phone number.

## 2016-04-23 NOTE — Telephone Encounter (Signed)
Called patient again since no return call. She stated she received message she just had not had a chance to call back. She declines repeating MRI at this time. She stated she went and saw oncologist who told her it was too small to repeat as soon as 1 year after. She was advised by oncologist to repeat next year instead. She wants to hold off until next year. Advised her to call back if she changes her mind and I will let AA,MD know. She verbalized understanding.

## 2016-05-12 ENCOUNTER — Other Ambulatory Visit: Payer: Self-pay | Admitting: Internal Medicine

## 2016-05-30 DIAGNOSIS — H16223 Keratoconjunctivitis sicca, not specified as Sjogren's, bilateral: Secondary | ICD-10-CM | POA: Diagnosis not present

## 2016-07-04 DIAGNOSIS — T65891A Toxic effect of other specified substances, accidental (unintentional), initial encounter: Secondary | ICD-10-CM | POA: Diagnosis not present

## 2016-07-04 DIAGNOSIS — M1990 Unspecified osteoarthritis, unspecified site: Secondary | ICD-10-CM | POA: Diagnosis not present

## 2016-09-19 ENCOUNTER — Other Ambulatory Visit: Payer: Self-pay | Admitting: Family Medicine

## 2016-09-19 DIAGNOSIS — Z1231 Encounter for screening mammogram for malignant neoplasm of breast: Secondary | ICD-10-CM

## 2016-10-01 DIAGNOSIS — Z8572 Personal history of non-Hodgkin lymphomas: Secondary | ICD-10-CM | POA: Diagnosis not present

## 2016-10-01 DIAGNOSIS — I10 Essential (primary) hypertension: Secondary | ICD-10-CM | POA: Diagnosis not present

## 2016-10-01 DIAGNOSIS — Z79899 Other long term (current) drug therapy: Secondary | ICD-10-CM | POA: Diagnosis not present

## 2016-10-01 DIAGNOSIS — D801 Nonfamilial hypogammaglobulinemia: Secondary | ICD-10-CM | POA: Diagnosis not present

## 2016-10-01 DIAGNOSIS — Z0001 Encounter for general adult medical examination with abnormal findings: Secondary | ICD-10-CM | POA: Diagnosis not present

## 2016-10-01 DIAGNOSIS — D329 Benign neoplasm of meninges, unspecified: Secondary | ICD-10-CM | POA: Diagnosis not present

## 2016-10-01 DIAGNOSIS — G4700 Insomnia, unspecified: Secondary | ICD-10-CM | POA: Diagnosis not present

## 2016-10-01 DIAGNOSIS — R69 Illness, unspecified: Secondary | ICD-10-CM | POA: Diagnosis not present

## 2016-10-03 ENCOUNTER — Telehealth: Payer: Self-pay | Admitting: Hematology and Oncology

## 2016-10-03 ENCOUNTER — Encounter: Payer: Self-pay | Admitting: Hematology and Oncology

## 2016-10-03 ENCOUNTER — Other Ambulatory Visit (HOSPITAL_BASED_OUTPATIENT_CLINIC_OR_DEPARTMENT_OTHER): Payer: Medicare HMO

## 2016-10-03 ENCOUNTER — Ambulatory Visit (HOSPITAL_BASED_OUTPATIENT_CLINIC_OR_DEPARTMENT_OTHER): Payer: Medicare HMO | Admitting: Hematology and Oncology

## 2016-10-03 VITALS — BP 141/64 | HR 68 | Temp 98.7°F | Resp 18 | Ht 64.0 in | Wt 171.2 lb

## 2016-10-03 DIAGNOSIS — Z8572 Personal history of non-Hodgkin lymphomas: Secondary | ICD-10-CM

## 2016-10-03 DIAGNOSIS — D801 Nonfamilial hypogammaglobulinemia: Secondary | ICD-10-CM

## 2016-10-03 DIAGNOSIS — D329 Benign neoplasm of meninges, unspecified: Secondary | ICD-10-CM

## 2016-10-03 LAB — CBC WITH DIFFERENTIAL/PLATELET
BASO%: 1.3 % (ref 0.0–2.0)
Basophils Absolute: 0.1 10*3/uL (ref 0.0–0.1)
EOS%: 5 % (ref 0.0–7.0)
Eosinophils Absolute: 0.3 10*3/uL (ref 0.0–0.5)
HEMATOCRIT: 40.2 % (ref 34.8–46.6)
HEMOGLOBIN: 13.8 g/dL (ref 11.6–15.9)
LYMPH#: 1.6 10*3/uL (ref 0.9–3.3)
LYMPH%: 25.1 % (ref 14.0–49.7)
MCH: 30.4 pg (ref 25.1–34.0)
MCHC: 34.3 g/dL (ref 31.5–36.0)
MCV: 88.6 fL (ref 79.5–101.0)
MONO#: 0.5 10*3/uL (ref 0.1–0.9)
MONO%: 7.1 % (ref 0.0–14.0)
NEUT%: 61.5 % (ref 38.4–76.8)
NEUTROS ABS: 3.9 10*3/uL (ref 1.5–6.5)
Platelets: 161 10*3/uL (ref 145–400)
RBC: 4.54 10*6/uL (ref 3.70–5.45)
RDW: 15.4 % — AB (ref 11.2–14.5)
WBC: 6.4 10*3/uL (ref 3.9–10.3)

## 2016-10-03 LAB — COMPREHENSIVE METABOLIC PANEL
ALBUMIN: 4 g/dL (ref 3.5–5.0)
ALK PHOS: 70 U/L (ref 40–150)
ALT: 32 U/L (ref 0–55)
ANION GAP: 8 meq/L (ref 3–11)
AST: 25 U/L (ref 5–34)
BILIRUBIN TOTAL: 1.05 mg/dL (ref 0.20–1.20)
BUN: 15.6 mg/dL (ref 7.0–26.0)
CO2: 29 mEq/L (ref 22–29)
Calcium: 10.4 mg/dL (ref 8.4–10.4)
Chloride: 104 mEq/L (ref 98–109)
Creatinine: 0.8 mg/dL (ref 0.6–1.1)
EGFR: 72 mL/min/{1.73_m2} — AB (ref >90)
Glucose: 100 mg/dl (ref 70–140)
POTASSIUM: 3.6 meq/L (ref 3.5–5.1)
Sodium: 141 mEq/L (ref 136–145)
TOTAL PROTEIN: 6 g/dL — AB (ref 6.4–8.3)

## 2016-10-03 NOTE — Assessment & Plan Note (Signed)
Clinical examination and recent blood work showed no evidence of lymphoma recurrence. She is reassured.

## 2016-10-03 NOTE — Assessment & Plan Note (Signed)
She denies recent recurrent infections. She have profound acquired panhypogammaglobulinemia from prior treatment Due to lack of symptoms, she does not need IVIG treatment

## 2016-10-03 NOTE — Telephone Encounter (Signed)
Gave patient AVS and calender per 5/10 los. Lab and f/u in one year

## 2016-10-03 NOTE — Progress Notes (Signed)
[facility] OFFICE PROGRESS NOTE  Patient Care Team: Jonathon Jordan, MD as PCP - General (Family Medicine) Unice Bailey, MD as Consulting Physician (Rheumatology) Heath Lark, MD as Consulting Physician (Hematology and Oncology)  SUMMARY OF ONCOLOGIC HISTORY: Oncology History   --2005.  Living in [place], [place]. Non-Hodgkin's lymphoma, follicular cell origin, grade 1, CD10 and CD20 positive presenting as a right submandibular mass. PET scan showed stage  III disease.    --September, 2005 - March 2006.  Received 8 cycles of Rituxan,fludarabine, mitoxantrone and Decadron.  --March, 2006 - March, 2008.  Received maintenance Rituxan x 2 years.    --05/02/2011.   PET scan c/w NED     --06/22/2012.  CT of neck, chest, abdomen and pelvis, c/w NED. The patient is not on any therapy.     History of non-Hodgkin's lymphoma    INTERVAL HISTORY: Please see below for problem oriented charting. She returns for further follow-up She denies new lymphadenopathy No recent infection Appetite is stable, no recent weight loss  REVIEW OF SYSTEMS:   Constitutional: Denies fevers, chills or abnormal weight loss Eyes: Denies blurriness of vision Ears, nose, mouth, throat, and face: Denies mucositis or sore throat Respiratory: Denies cough, dyspnea or wheezes Cardiovascular: Denies palpitation, chest discomfort or lower extremity swelling Gastrointestinal:  Denies nausea, heartburn or change in bowel habits Skin: Denies abnormal skin rashes Lymphatics: Denies new lymphadenopathy or easy bruising Neurological:Denies numbness, tingling or new weaknesses Behavioral/Psych: Mood is stable, no new changes  All other systems were reviewed with the patient and are negative.  I have reviewed the past medical history, past surgical history, social history and family history with the patient and they are unchanged from previous note.  ALLERGIES:  has No Known Allergies.  MEDICATIONS:   Current Outpatient Prescriptions  Medication Sig Dispense Refill  . ALPRAZolam (XANAX) 0.5 MG tablet Take 1 tablet (0.5 mg total) by mouth 2 (two) times daily as needed for anxiety. 60 tablet 2  . Alum & Mag Hydroxide-Simeth (MAGIC MOUTHWASH) SOLN Take 5 mLs by mouth 3 (three) times daily as needed for mouth pain. 400 mL 0  . amLODipine (NORVASC) 10 MG tablet TK 1 T PO QD  4  . cetirizine (ZYRTEC) 10 MG tablet Take 1 tablet (10 mg total) by mouth daily. 90 tablet 3  . famotidine (PEPCID) 20 MG tablet Take 1 tablet (20 mg total) by mouth 2 (two) times daily. 762 tablet 3  . folic acid (FOLVITE) 831 MCG tablet Take 400 mcg by mouth daily.    . hydrochlorothiazide (HYDRODIURIL) 12.5 MG tablet Take 1 tablet (12.5 mg total) by mouth daily. 90 tablet 3  . methotrexate (RHEUMATREX) 2.5 MG tablet Take 10 mg by mouth once a week. Tuesdays    . PARoxetine (PAXIL) 20 MG tablet Take 1 tablet (20 mg total) by mouth every morning. 90 tablet 3  . RESTASIS 0.05 % ophthalmic emulsion   2  . tiZANidine (ZANAFLEX) 4 MG tablet Take 1 tablet (4 mg total) by mouth every 8 (eight) hours as needed. 180 tablet 3  . traMADol (ULTRAM) 50 MG tablet Take 100 mg by mouth every 6 (six) hours as needed.   0  . traZODone (DESYREL) 50 MG tablet Take 0.5-1 tablets (25-50 mg total) by mouth at bedtime as needed for sleep. 30 tablet 5   No current facility-administered medications for this visit.     PHYSICAL EXAMINATION: ECOG PERFORMANCE STATUS: 0 - Asymptomatic  Vitals:   10/03/16 1158  BP: (!) 141/64  Pulse: 68  Resp: 18  Temp: 98.7 F (37.1 C)   Filed Weights   10/03/16 1158  Weight: 171 lb 3.2 oz (77.7 kg)    GENERAL:alert, no distress and comfortable SKIN: skin color, texture, turgor are normal, no rashes or significant lesions EYES: normal, Conjunctiva are pink and non-injected, sclera clear OROPHARYNX:no exudate, no erythema and lips, buccal mucosa, and tongue normal  NECK: supple, thyroid normal size,  non-tender, without nodularity LYMPH:  no palpable lymphadenopathy in the cervical, axillary or inguinal LUNGS: clear to auscultation and percussion with normal breathing effort HEART: regular rate & rhythm and no murmurs and no lower extremity edema ABDOMEN:abdomen soft, non-tender and normal bowel sounds Musculoskeletal:no cyanosis of digits and no clubbing  NEURO: alert & oriented x 3 with fluent speech, no focal motor/sensory deficits  LABORATORY DATA:  I have reviewed the data as listed    Component Value Date/Time   NA 140 02/21/2015 1234   K 4.1 02/21/2015 1234   CL 103 12/02/2014 1529   CL 103 06/22/2012 1045   CO2 25 02/21/2015 1234   GLUCOSE 103 02/21/2015 1234   GLUCOSE 92 06/22/2012 1045   BUN 15.4 02/21/2015 1234   CREATININE 0.7 02/21/2015 1234   CALCIUM 9.7 02/21/2015 1234   PROT 6.1 (L) 02/21/2015 1234   ALBUMIN 4.2 02/21/2015 1234   AST 24 02/21/2015 1234   ALT 28 02/21/2015 1234   ALKPHOS 72 02/21/2015 1234   BILITOT 0.93 02/21/2015 1234    No results found for: SPEP, UPEP  Lab Results  Component Value Date   WBC 6.4 10/03/2016   NEUTROABS 3.9 10/03/2016   HGB 13.8 10/03/2016   HCT 40.2 10/03/2016   MCV 88.6 10/03/2016   PLT 161 10/03/2016      Chemistry      Component Value Date/Time   NA 140 02/21/2015 1234   K 4.1 02/21/2015 1234   CL 103 12/02/2014 1529   CL 103 06/22/2012 1045   CO2 25 02/21/2015 1234   BUN 15.4 02/21/2015 1234   CREATININE 0.7 02/21/2015 1234      Component Value Date/Time   CALCIUM 9.7 02/21/2015 1234   ALKPHOS 72 02/21/2015 1234   AST 24 02/21/2015 1234   ALT 28 02/21/2015 1234   BILITOT 0.93 02/21/2015 1234      ASSESSMENT & PLAN:  History of non-Hodgkin's lymphoma Clinical examination and recent blood work showed no evidence of lymphoma recurrence. She is reassured.  Acquired hypogammaglobulinemia ([place]) She denies recent recurrent infections. She have profound acquired panhypogammaglobulinemia from prior  treatment Due to lack of symptoms, she does not need IVIG treatment  Meningioma [facility]) She is not symptomatic except for occasional headaches Will defer to her neurologist for further management   Orders Placed This Encounter  Procedures  . CBC with Differential/Platelet    Standing Status:   Future    Standing Expiration Date:   11/07/2017  . Comprehensive metabolic panel    Standing Status:   Future    Standing Expiration Date:   11/07/2017  . IgG, IgA, IgM    Standing Status:   Future    Standing Expiration Date:   11/07/2017   All questions were answered. The patient knows to call the clinic with any problems, questions or concerns. No barriers to learning was detected. I spent 15 minutes counseling the patient face to face. The total time spent in the appointment was 20 minutes and more than 50% was on counseling and  review of test results     Heath Lark, MD 10/03/2016 12:13 PM

## 2016-10-03 NOTE — Assessment & Plan Note (Signed)
She is not symptomatic except for occasional headaches Will defer to her neurologist for further management 

## 2016-10-04 LAB — IGG, IGA, IGM
IGG (IMMUNOGLOBIN G), SERUM: 166 mg/dL — AB (ref 700–1600)
IgA, Qn, Serum: <5 mg/dL — ABNORMAL LOW (ref 64–422)
IgM, Qn, Serum: 12 mg/dL — ABNORMAL LOW (ref 26–217)

## 2016-10-08 ENCOUNTER — Ambulatory Visit: Payer: Medicare Other

## 2017-01-07 DIAGNOSIS — T65891A Toxic effect of other specified substances, accidental (unintentional), initial encounter: Secondary | ICD-10-CM | POA: Diagnosis not present

## 2017-01-07 DIAGNOSIS — M1990 Unspecified osteoarthritis, unspecified site: Secondary | ICD-10-CM | POA: Diagnosis not present

## 2017-01-07 DIAGNOSIS — N390 Urinary tract infection, site not specified: Secondary | ICD-10-CM | POA: Diagnosis not present

## 2017-01-07 DIAGNOSIS — M79643 Pain in unspecified hand: Secondary | ICD-10-CM | POA: Diagnosis not present

## 2017-01-07 DIAGNOSIS — M19042 Primary osteoarthritis, left hand: Secondary | ICD-10-CM | POA: Diagnosis not present

## 2017-01-07 DIAGNOSIS — M19041 Primary osteoarthritis, right hand: Secondary | ICD-10-CM | POA: Diagnosis not present

## 2017-03-15 IMAGING — DX DG BONE SURVEY MET
8 of 10 series · 8 of 10 positions shown · non-contrast
Comparison: None in PACs

CLINICAL DATA: Abnormal SPEP, headache in weakness, history of
lymphoma.

EXAM:
METASTATIC BONE SURVEY

[skull lat]
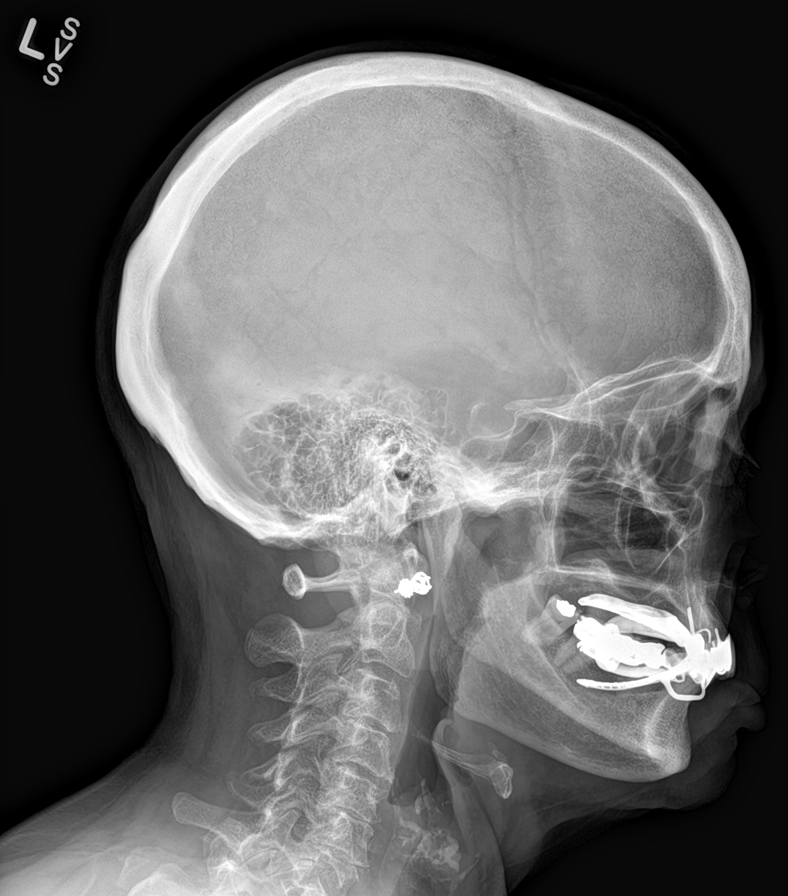

[shoulder ap (1 of 2)]
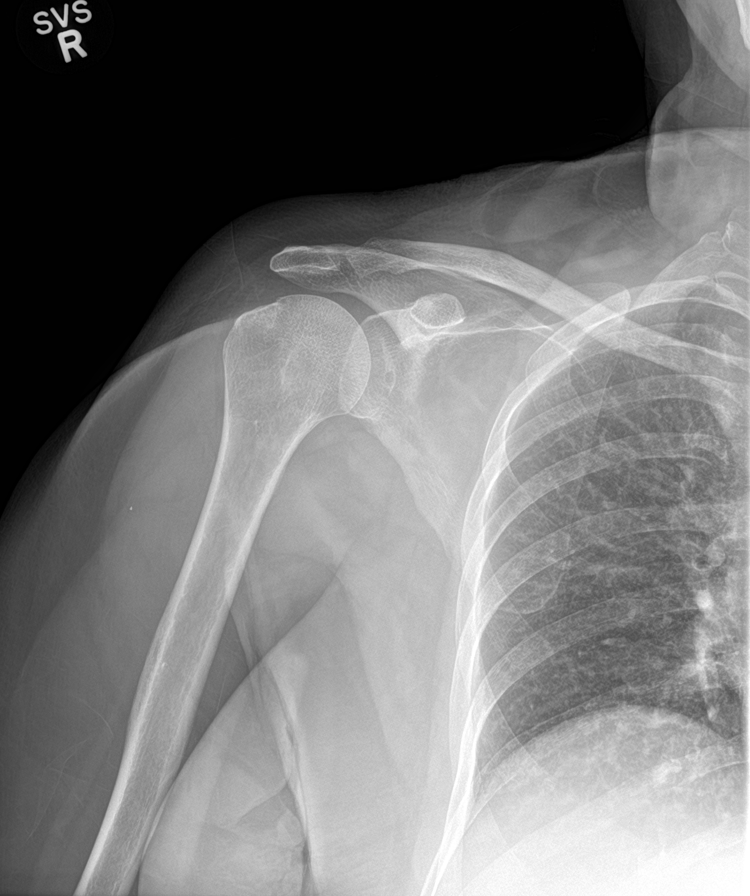

[shoulder ap (2 of 2)]
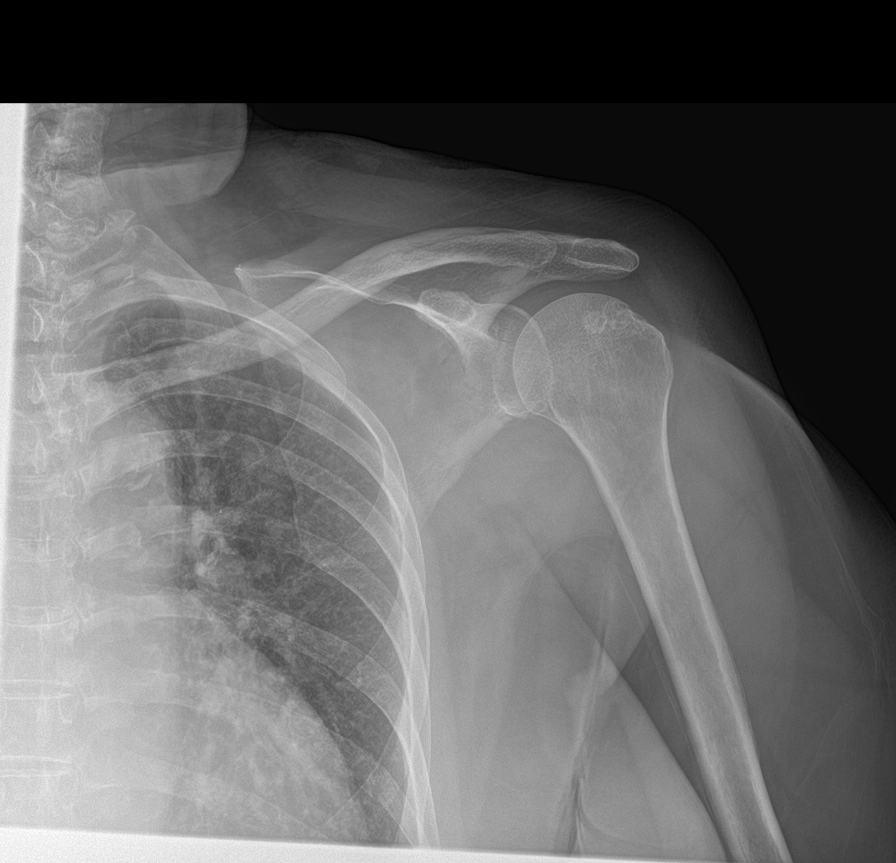

[humerus ap (1 of 2)]
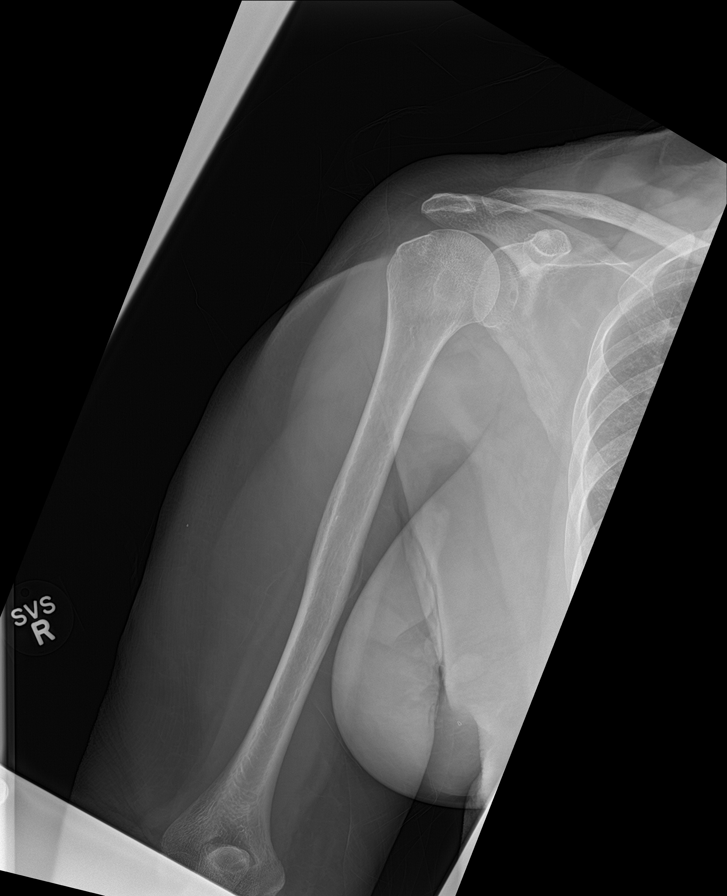

[humerus ap (2 of 2)]
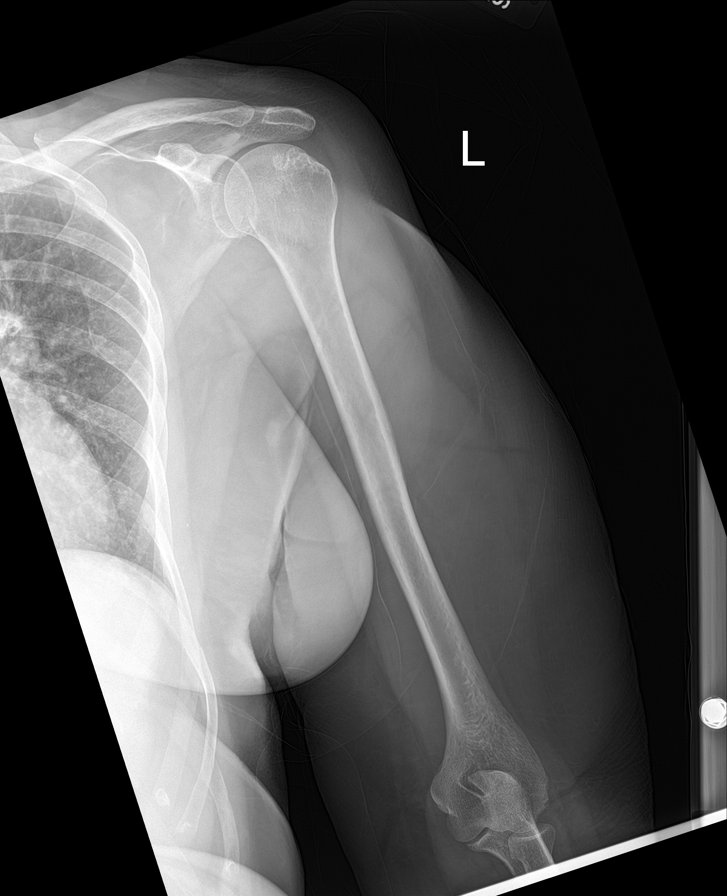

[forearm ap (1 of 2)]
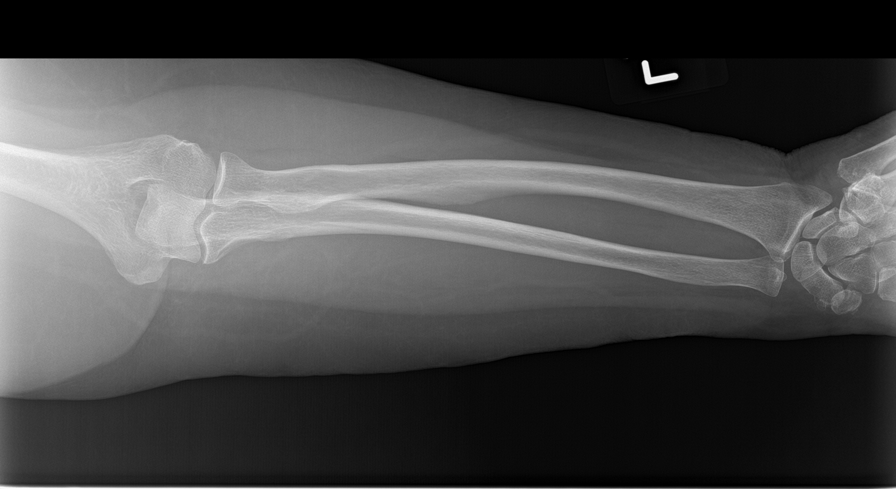

[forearm ap (2 of 2)]
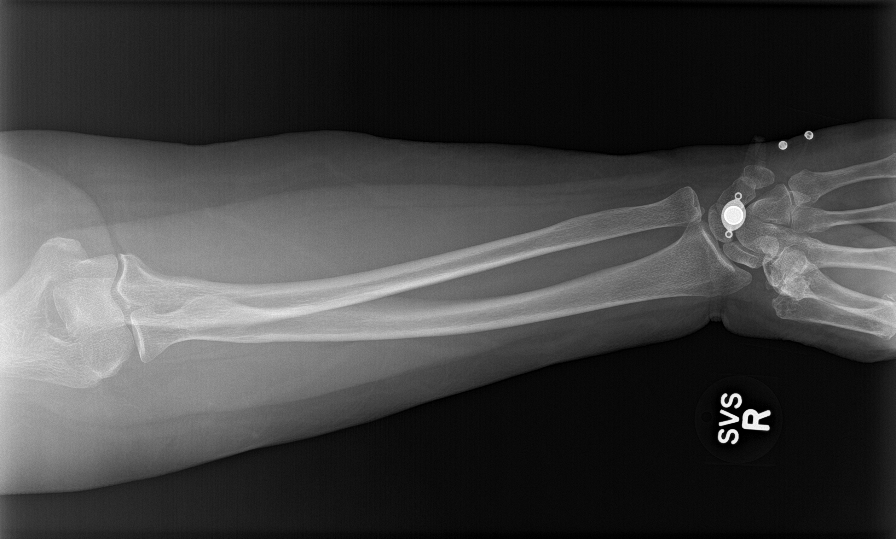

[c-spine ap]
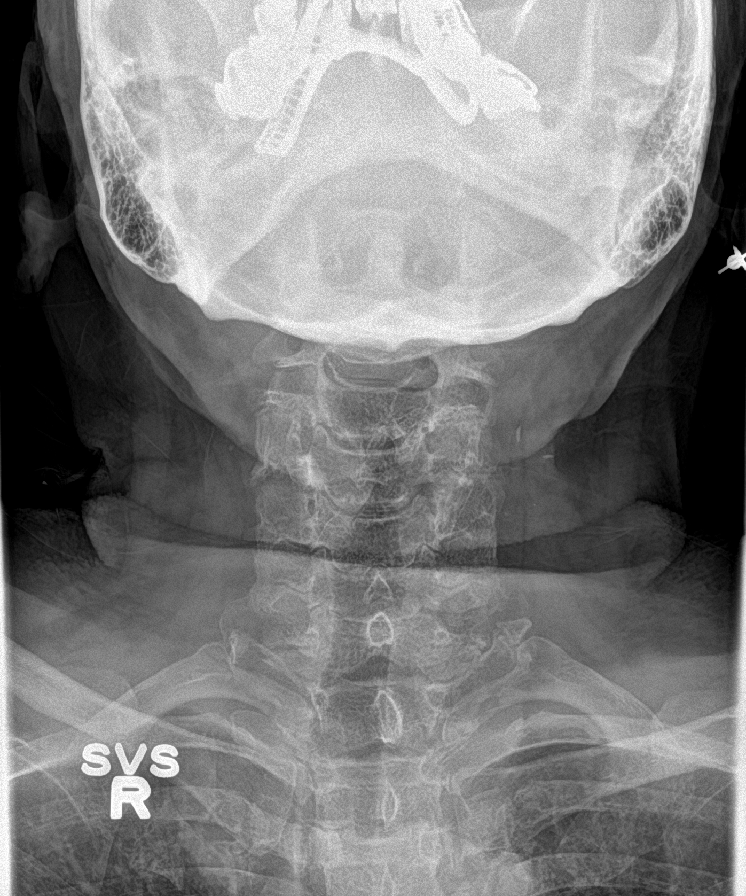

[8 of 10 positions shown; findings below may reference images not displayed]

FINDINGS: Chest x-ray: The lungs are adequately inflated and clear. The heart
and pulmonary vascularity are normal. The mediastinum and hilar
regions are unremarkable. There is no pleural effusion. The observed
ribs exhibit no acute bony abnormalities. The clavicles are intact.

Calvarium:  No lytic nor blastic bony lesions are observed.

Spine: There is multilevel degenerative disc disease of the cervical
spine. The thoracic spine exhibits no significant abnormality. The
pedicles are intact. The lumbar spine exhibits moderate degenerative
disc disease at multiple levels with mild dextrocurvature. There is
no compression fracture.

The bony pelvis appears reasonably well mineralized with no lytic or
blastic lesion. The hip joint spaces exhibit mild symmetric
narrowing. There is no lytic or blastic lesion.

Lower extremities: There are mild degenerative changes of both knees
with chondrocalcinosis. No lytic nor blastic bony lesions are
observed.

Pectoral girdles: The shoulder joints appear intact. No lytic or
blastic bony lesions in the upper extremities are observed. There
are degenerative changes of the first carpometacarpal joints
bilaterally.
IMPRESSION: No skeletal findings to suggest myelomatous or other malignant
involvement. There are degenerative changes at multiple sites as
described.

## 2017-04-24 DIAGNOSIS — Z23 Encounter for immunization: Secondary | ICD-10-CM | POA: Diagnosis not present

## 2017-04-24 DIAGNOSIS — G4700 Insomnia, unspecified: Secondary | ICD-10-CM | POA: Diagnosis not present

## 2017-04-24 DIAGNOSIS — I10 Essential (primary) hypertension: Secondary | ICD-10-CM | POA: Diagnosis not present

## 2017-04-24 DIAGNOSIS — R69 Illness, unspecified: Secondary | ICD-10-CM | POA: Diagnosis not present

## 2017-04-24 DIAGNOSIS — M2550 Pain in unspecified joint: Secondary | ICD-10-CM | POA: Diagnosis not present

## 2017-10-03 ENCOUNTER — Telehealth: Payer: Self-pay | Admitting: Hematology and Oncology

## 2017-10-03 ENCOUNTER — Inpatient Hospital Stay: Payer: Medicare HMO

## 2017-10-03 ENCOUNTER — Encounter: Payer: Self-pay | Admitting: Hematology and Oncology

## 2017-10-03 ENCOUNTER — Inpatient Hospital Stay: Payer: Medicare HMO | Attending: Hematology and Oncology | Admitting: Hematology and Oncology

## 2017-10-03 DIAGNOSIS — Z8572 Personal history of non-Hodgkin lymphomas: Secondary | ICD-10-CM

## 2017-10-03 DIAGNOSIS — M47899 Other spondylosis, site unspecified: Secondary | ICD-10-CM | POA: Diagnosis not present

## 2017-10-03 DIAGNOSIS — D801 Nonfamilial hypogammaglobulinemia: Secondary | ICD-10-CM

## 2017-10-03 DIAGNOSIS — D329 Benign neoplasm of meninges, unspecified: Secondary | ICD-10-CM

## 2017-10-03 LAB — CBC WITH DIFFERENTIAL/PLATELET
Basophils Absolute: 0.1 10*3/uL (ref 0.0–0.1)
Basophils Relative: 1 %
EOS PCT: 4 %
Eosinophils Absolute: 0.3 10*3/uL (ref 0.0–0.5)
HCT: 39 % (ref 34.8–46.6)
HEMOGLOBIN: 13.3 g/dL (ref 11.6–15.9)
LYMPHS PCT: 22 %
Lymphs Abs: 1.4 10*3/uL (ref 0.9–3.3)
MCH: 30.4 pg (ref 25.1–34.0)
MCHC: 34.2 g/dL (ref 31.5–36.0)
MCV: 88.8 fL (ref 79.5–101.0)
MONOS PCT: 7 %
Monocytes Absolute: 0.4 10*3/uL (ref 0.1–0.9)
NEUTROS PCT: 66 %
Neutro Abs: 4.2 10*3/uL (ref 1.5–6.5)
Platelets: 155 10*3/uL (ref 145–400)
RBC: 4.39 MIL/uL (ref 3.70–5.45)
RDW: 15.7 % — ABNORMAL HIGH (ref 11.2–14.5)
WBC: 6.4 10*3/uL (ref 3.9–10.3)

## 2017-10-03 LAB — COMPREHENSIVE METABOLIC PANEL
ALK PHOS: 69 U/L (ref 40–150)
ALT: 37 U/L (ref 0–55)
AST: 33 U/L (ref 5–34)
Albumin: 4.1 g/dL (ref 3.5–5.0)
Anion gap: 6 (ref 3–11)
BUN: 11 mg/dL (ref 7–26)
CALCIUM: 9.8 mg/dL (ref 8.4–10.4)
CO2: 25 mmol/L (ref 22–29)
CREATININE: 0.78 mg/dL (ref 0.60–1.10)
Chloride: 107 mmol/L (ref 98–109)
Glucose, Bld: 101 mg/dL (ref 70–140)
Potassium: 4.1 mmol/L (ref 3.5–5.1)
Sodium: 138 mmol/L (ref 136–145)
Total Bilirubin: 1 mg/dL (ref 0.2–1.2)
Total Protein: 6.2 g/dL — ABNORMAL LOW (ref 6.4–8.3)

## 2017-10-03 NOTE — Telephone Encounter (Signed)
Gave patient AVs and calendar of upcoming May 2020 appointments.  °

## 2017-10-03 NOTE — Assessment & Plan Note (Signed)
She is taking methotrexate for degenerative arthritis and rheumatoid arthritis Her blood counts are satisfactory I will defer to her physician for further management

## 2017-10-03 NOTE — Assessment & Plan Note (Signed)
She is not symptomatic except for occasional headaches Will defer to her neurologist for further management

## 2017-10-03 NOTE — Progress Notes (Signed)
[facility] PROGRESS NOTE  Patient Care Team: Lujean Amel, MD as PCP - General (Family Medicine) Unice Bailey, MD as Consulting Physician (Rheumatology) Heath Lark, MD as Consulting Physician (Hematology and Oncology)  ASSESSMENT & PLAN:  History of non-Hodgkin's lymphoma She has no signs or symptoms to suggest cancer recurrence She is a long-term cancer survivor However, the patient wants to continue follow-up here once a year I will see her back next year for further follow-up  Degenerative joint disease She is taking methotrexate for degenerative arthritis and rheumatoid arthritis Her blood counts are satisfactory I will defer to her physician for further management  Meningioma [facility]) She is not symptomatic except for occasional headaches Will defer to her neurologist for further management   No orders of the defined types were placed in this encounter.   INTERVAL HISTORY: Please see below for problem oriented charting. She returns for further follow-up She complained of feeling unwell When I asked her to clarify further, she complained of low energy She sleeps well She denies new lymphadenopathy Appetite is stable without recent weight loss She has occasional headaches.  She has history of meningioma No recent falls or new neurological deficit She takes methotrexate injection weekly for bone/arthritis pain. She denies recent fever, chills or sweats  SUMMARY OF ONCOLOGIC HISTORY: Oncology History   --2005.  Living in [place], [place]. Non-Hodgkin's lymphoma, follicular cell origin, grade 1, CD10 and CD20 positive presenting as a right submandibular mass. PET scan showed stage  III disease.    --September, 2005 - March 2006.  Received 8 cycles of Rituxan,fludarabine, mitoxantrone and Decadron.  --March, 2006 - March, 2008.  Received maintenance Rituxan x 2 years.    --05/02/2011.   PET scan c/w NED     --06/22/2012.  CT of neck, chest,  abdomen and pelvis, c/w NED. The patient is not on any therapy.     History of non-Hodgkin's lymphoma    REVIEW OF SYSTEMS:   Constitutional: Denies fevers, chills or abnormal weight loss Eyes: Denies blurriness of vision Ears, nose, mouth, throat, and face: Denies mucositis or sore throat Respiratory: Denies cough, dyspnea or wheezes Cardiovascular: Denies palpitation, chest discomfort or lower extremity swelling Gastrointestinal:  Denies nausea, heartburn or change in bowel habits Skin: Denies abnormal skin rashes Lymphatics: Denies new lymphadenopathy or easy bruising Neurological:Denies numbness, tingling or new weaknesses Behavioral/Psych: Mood is stable, no new changes  All other systems were reviewed with the patient and are negative.  I have reviewed the past medical history, past surgical history, social history and family history with the patient and they are unchanged from previous note.  ALLERGIES:  has No Known Allergies.  MEDICATIONS:  Current Outpatient Medications  Medication Sig Dispense Refill  . ALPRAZolam (XANAX) 0.5 MG tablet Take 1 tablet (0.5 mg total) by mouth 2 (two) times daily as needed for anxiety. 60 tablet 2  . amLODipine (NORVASC) 10 MG tablet TK 1 T PO QD  4  . cetirizine (ZYRTEC) 10 MG tablet Take 1 tablet (10 mg total) by mouth daily. 90 tablet 3  . famotidine (PEPCID) 20 MG tablet Take 1 tablet (20 mg total) by mouth 2 (two) times daily. 509 tablet 3  . folic acid (FOLVITE) 326 MCG tablet Take 400 mcg by mouth daily.    . methotrexate (RHEUMATREX) 2.5 MG tablet Take 10 mg by mouth once a week. Tuesdays    . PARoxetine (PAXIL) 20 MG tablet Take 1 tablet (20 mg total) by mouth every  morning. 90 tablet 3  . tiZANidine (ZANAFLEX) 4 MG tablet Take 1 tablet (4 mg total) by mouth every 8 (eight) hours as needed. 180 tablet 3  . traMADol (ULTRAM) 50 MG tablet Take 100 mg by mouth every 6 (six) hours as needed.   0  . traZODone (DESYREL) 50 MG tablet Take  0.5-1 tablets (25-50 mg total) by mouth at bedtime as needed for sleep. 30 tablet 5   No current facility-administered medications for this visit.     PHYSICAL EXAMINATION: ECOG PERFORMANCE STATUS: 1 - Symptomatic but completely ambulatory  Vitals:   10/03/17 1446  BP: (!) 141/80  Pulse: 71  Resp: 18  Temp: 98.7 F (37.1 C)  SpO2: 96%   Filed Weights   10/03/17 1446  Weight: 163 lb 8 oz (74.2 kg)    GENERAL:alert, no distress and comfortable SKIN: skin color, texture, turgor are normal, no rashes or significant lesions EYES: normal, Conjunctiva are pink and non-injected, sclera clear OROPHARYNX:no exudate, no erythema and lips, buccal mucosa, and tongue normal  NECK: supple, thyroid normal size, non-tender, without nodularity LYMPH:  no palpable lymphadenopathy in the cervical, axillary or inguinal LUNGS: clear to auscultation and percussion with normal breathing effort HEART: regular rate & rhythm and no murmurs and no lower extremity edema ABDOMEN:abdomen soft, non-tender and normal bowel sounds Musculoskeletal:no cyanosis of digits and no clubbing  NEURO: alert & oriented x 3 with fluent speech, no focal motor/sensory deficits  LABORATORY DATA:  I have reviewed the data as listed    Component Value Date/Time   NA 141 10/03/2016 1141   K 3.6 10/03/2016 1141   CL 103 12/02/2014 1529   CL 103 06/22/2012 1045   CO2 29 10/03/2016 1141   GLUCOSE 100 10/03/2016 1141   GLUCOSE 92 06/22/2012 1045   BUN 15.6 10/03/2016 1141   CREATININE 0.8 10/03/2016 1141   CALCIUM 10.4 10/03/2016 1141   PROT 6.0 (L) 10/03/2016 1141   ALBUMIN 4.0 10/03/2016 1141   AST 25 10/03/2016 1141   ALT 32 10/03/2016 1141   ALKPHOS 70 10/03/2016 1141   BILITOT 1.05 10/03/2016 1141    No results found for: SPEP, UPEP  Lab Results  Component Value Date   WBC 6.4 10/03/2017   NEUTROABS 4.2 10/03/2017   HGB 13.3 10/03/2017   HCT 39.0 10/03/2017   MCV 88.8 10/03/2017   PLT 155 10/03/2017       Chemistry      Component Value Date/Time   NA 141 10/03/2016 1141   K 3.6 10/03/2016 1141   CL 103 12/02/2014 1529   CL 103 06/22/2012 1045   CO2 29 10/03/2016 1141   BUN 15.6 10/03/2016 1141   CREATININE 0.8 10/03/2016 1141      Component Value Date/Time   CALCIUM 10.4 10/03/2016 1141   ALKPHOS 70 10/03/2016 1141   AST 25 10/03/2016 1141   ALT 32 10/03/2016 1141   BILITOT 1.05 10/03/2016 1141      All questions were answered. The patient knows to call the clinic with any problems, questions or concerns. No barriers to learning was detected.  I spent 10 minutes counseling the patient face to face. The total time spent in the appointment was 15 minutes and more than 50% was on counseling and review of test results  Heath Lark, MD 10/03/2017 3:04 PM

## 2017-10-03 NOTE — Assessment & Plan Note (Signed)
She has no signs or symptoms to suggest cancer recurrence She is a long-term cancer survivor However, the patient wants to continue follow-up here once a year I will see her back next year for further follow-up

## 2017-10-04 LAB — IGG, IGA, IGM
IGG (IMMUNOGLOBIN G), SERUM: 214 mg/dL — AB (ref 700–1600)
IgM (Immunoglobulin M), Srm: 14 mg/dL — ABNORMAL LOW (ref 26–217)

## 2017-10-24 DIAGNOSIS — Z79899 Other long term (current) drug therapy: Secondary | ICD-10-CM | POA: Diagnosis not present

## 2017-10-24 DIAGNOSIS — E7800 Pure hypercholesterolemia, unspecified: Secondary | ICD-10-CM | POA: Diagnosis not present

## 2017-10-24 DIAGNOSIS — I10 Essential (primary) hypertension: Secondary | ICD-10-CM | POA: Diagnosis not present

## 2017-10-24 DIAGNOSIS — Z8572 Personal history of non-Hodgkin lymphomas: Secondary | ICD-10-CM | POA: Diagnosis not present

## 2017-10-24 DIAGNOSIS — G4700 Insomnia, unspecified: Secondary | ICD-10-CM | POA: Diagnosis not present

## 2017-10-24 DIAGNOSIS — M797 Fibromyalgia: Secondary | ICD-10-CM | POA: Diagnosis not present

## 2017-10-24 DIAGNOSIS — D329 Benign neoplasm of meninges, unspecified: Secondary | ICD-10-CM | POA: Diagnosis not present

## 2017-10-24 DIAGNOSIS — R69 Illness, unspecified: Secondary | ICD-10-CM | POA: Diagnosis not present

## 2017-12-30 DIAGNOSIS — M1990 Unspecified osteoarthritis, unspecified site: Secondary | ICD-10-CM | POA: Diagnosis not present

## 2017-12-30 DIAGNOSIS — M79643 Pain in unspecified hand: Secondary | ICD-10-CM | POA: Diagnosis not present

## 2017-12-30 DIAGNOSIS — G8929 Other chronic pain: Secondary | ICD-10-CM | POA: Diagnosis not present

## 2017-12-30 DIAGNOSIS — Z79899 Other long term (current) drug therapy: Secondary | ICD-10-CM | POA: Diagnosis not present

## 2017-12-30 DIAGNOSIS — T65891A Toxic effect of other specified substances, accidental (unintentional), initial encounter: Secondary | ICD-10-CM | POA: Diagnosis not present

## 2018-02-01 DIAGNOSIS — R42 Dizziness and giddiness: Secondary | ICD-10-CM | POA: Diagnosis not present

## 2018-02-13 DIAGNOSIS — Z23 Encounter for immunization: Secondary | ICD-10-CM | POA: Diagnosis not present

## 2018-02-17 DIAGNOSIS — E559 Vitamin D deficiency, unspecified: Secondary | ICD-10-CM | POA: Diagnosis not present

## 2018-02-17 DIAGNOSIS — M797 Fibromyalgia: Secondary | ICD-10-CM | POA: Diagnosis not present

## 2018-02-17 DIAGNOSIS — R55 Syncope and collapse: Secondary | ICD-10-CM | POA: Diagnosis not present

## 2018-02-17 DIAGNOSIS — R5382 Chronic fatigue, unspecified: Secondary | ICD-10-CM | POA: Diagnosis not present

## 2018-05-06 DIAGNOSIS — Z8572 Personal history of non-Hodgkin lymphomas: Secondary | ICD-10-CM | POA: Diagnosis not present

## 2018-05-06 DIAGNOSIS — R69 Illness, unspecified: Secondary | ICD-10-CM | POA: Diagnosis not present

## 2018-05-06 DIAGNOSIS — Z0001 Encounter for general adult medical examination with abnormal findings: Secondary | ICD-10-CM | POA: Diagnosis not present

## 2018-05-06 DIAGNOSIS — E7800 Pure hypercholesterolemia, unspecified: Secondary | ICD-10-CM | POA: Diagnosis not present

## 2018-05-06 DIAGNOSIS — Z79899 Other long term (current) drug therapy: Secondary | ICD-10-CM | POA: Diagnosis not present

## 2018-05-06 DIAGNOSIS — K219 Gastro-esophageal reflux disease without esophagitis: Secondary | ICD-10-CM | POA: Diagnosis not present

## 2018-05-06 DIAGNOSIS — I10 Essential (primary) hypertension: Secondary | ICD-10-CM | POA: Diagnosis not present

## 2018-05-06 DIAGNOSIS — G4700 Insomnia, unspecified: Secondary | ICD-10-CM | POA: Diagnosis not present

## 2018-06-17 DIAGNOSIS — R42 Dizziness and giddiness: Secondary | ICD-10-CM | POA: Diagnosis not present

## 2018-06-17 DIAGNOSIS — R5383 Other fatigue: Secondary | ICD-10-CM | POA: Diagnosis not present

## 2018-06-17 DIAGNOSIS — Z79899 Other long term (current) drug therapy: Secondary | ICD-10-CM | POA: Diagnosis not present

## 2018-06-17 DIAGNOSIS — G8929 Other chronic pain: Secondary | ICD-10-CM | POA: Diagnosis not present

## 2018-06-17 DIAGNOSIS — M064 Inflammatory polyarthropathy: Secondary | ICD-10-CM | POA: Diagnosis not present

## 2018-06-17 DIAGNOSIS — Z6833 Body mass index (BMI) 33.0-33.9, adult: Secondary | ICD-10-CM | POA: Diagnosis not present

## 2018-07-07 DIAGNOSIS — T65891A Toxic effect of other specified substances, accidental (unintentional), initial encounter: Secondary | ICD-10-CM | POA: Diagnosis not present

## 2018-07-07 DIAGNOSIS — M1990 Unspecified osteoarthritis, unspecified site: Secondary | ICD-10-CM | POA: Diagnosis not present

## 2018-07-07 DIAGNOSIS — M064 Inflammatory polyarthropathy: Secondary | ICD-10-CM | POA: Diagnosis not present

## 2018-07-07 DIAGNOSIS — Z79899 Other long term (current) drug therapy: Secondary | ICD-10-CM | POA: Diagnosis not present

## 2018-07-07 DIAGNOSIS — G8929 Other chronic pain: Secondary | ICD-10-CM | POA: Diagnosis not present

## 2018-07-07 DIAGNOSIS — R5383 Other fatigue: Secondary | ICD-10-CM | POA: Diagnosis not present

## 2018-09-24 DIAGNOSIS — M064 Inflammatory polyarthropathy: Secondary | ICD-10-CM | POA: Diagnosis not present

## 2018-09-24 DIAGNOSIS — Z79899 Other long term (current) drug therapy: Secondary | ICD-10-CM | POA: Diagnosis not present

## 2018-10-02 ENCOUNTER — Other Ambulatory Visit: Payer: Medicare HMO

## 2018-10-02 ENCOUNTER — Ambulatory Visit: Payer: Medicare HMO | Admitting: Hematology and Oncology

## 2018-11-02 ENCOUNTER — Telehealth: Payer: Self-pay

## 2018-11-02 NOTE — Telephone Encounter (Signed)
-----   Message from Heath Lark, MD sent at 11/02/2018  2:23 PM EDT ----- Regarding: appt next week I suggest deferring to 3 months but if she wants to keep her appt can she come in sooner? If she wants afternoon appt I can see her late on 6/16

## 2018-11-02 NOTE — Telephone Encounter (Signed)
Called and left below message. Ask her to call the office back. 

## 2018-11-03 NOTE — Telephone Encounter (Signed)
Called and left a message asking her to call the office. 

## 2018-11-04 NOTE — Telephone Encounter (Signed)
Called and given below message. She verbalized understanding. She would like to delay appt for 3 months. Scheduling message sent.

## 2018-11-05 ENCOUNTER — Telehealth: Payer: Self-pay | Admitting: Hematology and Oncology

## 2018-11-05 NOTE — Telephone Encounter (Signed)
Left message re September appointments. Schedule mailed.

## 2018-11-12 ENCOUNTER — Other Ambulatory Visit: Payer: Medicare HMO

## 2018-11-12 ENCOUNTER — Ambulatory Visit: Payer: Medicare HMO | Admitting: Hematology and Oncology

## 2018-12-02 ENCOUNTER — Other Ambulatory Visit: Payer: Self-pay | Admitting: *Deleted

## 2018-12-02 DIAGNOSIS — Z20822 Contact with and (suspected) exposure to covid-19: Secondary | ICD-10-CM

## 2018-12-02 DIAGNOSIS — R6889 Other general symptoms and signs: Secondary | ICD-10-CM | POA: Diagnosis not present

## 2018-12-02 NOTE — Progress Notes (Signed)
lab

## 2018-12-07 DIAGNOSIS — G4700 Insomnia, unspecified: Secondary | ICD-10-CM | POA: Diagnosis not present

## 2018-12-07 DIAGNOSIS — Z8572 Personal history of non-Hodgkin lymphomas: Secondary | ICD-10-CM | POA: Diagnosis not present

## 2018-12-07 DIAGNOSIS — I10 Essential (primary) hypertension: Secondary | ICD-10-CM | POA: Diagnosis not present

## 2018-12-07 DIAGNOSIS — K219 Gastro-esophageal reflux disease without esophagitis: Secondary | ICD-10-CM | POA: Diagnosis not present

## 2018-12-07 LAB — NOVEL CORONAVIRUS, NAA: SARS-CoV-2, NAA: NOT DETECTED

## 2019-01-05 DIAGNOSIS — Z79899 Other long term (current) drug therapy: Secondary | ICD-10-CM | POA: Diagnosis not present

## 2019-01-05 DIAGNOSIS — M1990 Unspecified osteoarthritis, unspecified site: Secondary | ICD-10-CM | POA: Diagnosis not present

## 2019-01-05 DIAGNOSIS — G8929 Other chronic pain: Secondary | ICD-10-CM | POA: Diagnosis not present

## 2019-01-05 DIAGNOSIS — M064 Inflammatory polyarthropathy: Secondary | ICD-10-CM | POA: Diagnosis not present

## 2019-01-05 DIAGNOSIS — T65891A Toxic effect of other specified substances, accidental (unintentional), initial encounter: Secondary | ICD-10-CM | POA: Diagnosis not present

## 2019-01-05 DIAGNOSIS — R5383 Other fatigue: Secondary | ICD-10-CM | POA: Diagnosis not present

## 2019-02-03 ENCOUNTER — Telehealth: Payer: Self-pay | Admitting: Hematology and Oncology

## 2019-02-03 NOTE — Telephone Encounter (Signed)
We can offer her a flu shot when she gets here

## 2019-02-03 NOTE — Telephone Encounter (Signed)
Called patient re covid screening for 9/10 appointment.   [place], lab and Dr. Alvy Bimler says she was tested about a month ago and her test was negative. She says she has some muscle pain but she is 88 and everything hurts. She answered no to the other questions but requested a flu shot. I have sent a message to Dr. Alvy Bimler regarding her request for a flu shot.

## 2019-02-03 NOTE — Telephone Encounter (Signed)
Called and given below message that she can get the flu shot tomorrow during appt. She verbalized understanding.

## 2019-02-04 ENCOUNTER — Inpatient Hospital Stay: Payer: Medicare HMO | Attending: Hematology and Oncology | Admitting: Hematology and Oncology

## 2019-02-04 ENCOUNTER — Other Ambulatory Visit: Payer: Self-pay | Admitting: Hematology and Oncology

## 2019-02-04 ENCOUNTER — Other Ambulatory Visit: Payer: Self-pay

## 2019-02-04 ENCOUNTER — Inpatient Hospital Stay: Payer: Medicare HMO

## 2019-02-04 VITALS — BP 150/82 | HR 56 | Temp 98.2°F | Resp 18 | Ht 64.0 in | Wt 182.8 lb

## 2019-02-04 DIAGNOSIS — D329 Benign neoplasm of meninges, unspecified: Secondary | ICD-10-CM | POA: Diagnosis not present

## 2019-02-04 DIAGNOSIS — Z23 Encounter for immunization: Secondary | ICD-10-CM | POA: Diagnosis not present

## 2019-02-04 DIAGNOSIS — Z8572 Personal history of non-Hodgkin lymphomas: Secondary | ICD-10-CM

## 2019-02-04 DIAGNOSIS — C8290 Follicular lymphoma, unspecified, unspecified site: Secondary | ICD-10-CM | POA: Insufficient documentation

## 2019-02-04 DIAGNOSIS — Z79899 Other long term (current) drug therapy: Secondary | ICD-10-CM | POA: Insufficient documentation

## 2019-02-04 LAB — COMPREHENSIVE METABOLIC PANEL
ALT: 41 U/L (ref 0–44)
AST: 37 U/L (ref 15–41)
Albumin: 4.1 g/dL (ref 3.5–5.0)
Alkaline Phosphatase: 82 U/L (ref 38–126)
Anion gap: 9 (ref 5–15)
BUN: 13 mg/dL (ref 8–23)
CO2: 26 mmol/L (ref 22–32)
Calcium: 9.5 mg/dL (ref 8.9–10.3)
Chloride: 108 mmol/L (ref 98–111)
Creatinine, Ser: 0.84 mg/dL (ref 0.44–1.00)
GFR calc Af Amer: >60 mL/min (ref >60)
GFR calc non Af Amer: >60 mL/min (ref >60)
Glucose, Bld: 93 mg/dL (ref 70–99)
Potassium: 4.4 mmol/L (ref 3.5–5.1)
Sodium: 143 mmol/L (ref 135–145)
Total Bilirubin: 0.9 mg/dL (ref 0.3–1.2)
Total Protein: 5.9 g/dL — ABNORMAL LOW (ref 6.5–8.1)

## 2019-02-04 LAB — CBC WITH DIFFERENTIAL/PLATELET
Abs Immature Granulocytes: 0.02 10*3/uL (ref 0.00–0.07)
Basophils Absolute: 0.1 10*3/uL (ref 0.0–0.1)
Basophils Relative: 1 %
Eosinophils Absolute: 0.3 10*3/uL (ref 0.0–0.5)
Eosinophils Relative: 4 %
HCT: 38.5 % (ref 36.0–46.0)
Hemoglobin: 13 g/dL (ref 12.0–15.0)
Immature Granulocytes: 0 %
Lymphocytes Relative: 19 %
Lymphs Abs: 1.4 10*3/uL (ref 0.7–4.0)
MCH: 31 pg (ref 26.0–34.0)
MCHC: 33.8 g/dL (ref 30.0–36.0)
MCV: 91.7 fL (ref 80.0–100.0)
Monocytes Absolute: 0.6 10*3/uL (ref 0.1–1.0)
Monocytes Relative: 8 %
Neutro Abs: 4.9 10*3/uL (ref 1.7–7.7)
Neutrophils Relative %: 68 %
Platelets: 141 10*3/uL — ABNORMAL LOW (ref 150–400)
RBC: 4.2 MIL/uL (ref 3.87–5.11)
RDW: 14.9 % (ref 11.5–15.5)
WBC: 7.2 10*3/uL (ref 4.0–10.5)
nRBC: 0 % (ref 0.0–0.2)

## 2019-02-04 LAB — LACTATE DEHYDROGENASE: LDH: 206 U/L — ABNORMAL HIGH (ref 98–192)

## 2019-02-04 MED ORDER — INFLUENZA VAC A&B SA ADJ QUAD 0.5 ML IM PRSY
0.5000 mL | PREFILLED_SYRINGE | Freq: Once | INTRAMUSCULAR | Status: AC
  Administered 2019-02-04: 0.5 mL via INTRAMUSCULAR
  Filled 2019-02-04: qty 0.5

## 2019-02-05 ENCOUNTER — Encounter: Payer: Self-pay | Admitting: Hematology and Oncology

## 2019-02-05 NOTE — Assessment & Plan Note (Signed)
She complained of dizziness She has not have MRI evaluation for 4 years I recommend repeat MRI and she agreed. I will call her with test results

## 2019-02-05 NOTE — Progress Notes (Signed)
[place] OFFICE PROGRESS NOTE  Patient Care Team: Lujean Amel, MD as PCP - General (Family Medicine) Unice Bailey, MD as Consulting Physician (Rheumatology) Heath Lark, MD as Consulting Physician (Hematology and Oncology)  ASSESSMENT & PLAN:  History of non-Hodgkin's lymphoma She has no signs or symptoms to suggest cancer recurrence She is a long-term cancer survivor However, the patient wants to continue follow-up here once a year I will see her back next year for further follow-up We discussed the importance of preventive care and reviewed the vaccination programs. She does not have any prior allergic reactions to influenza vaccination. She agrees to proceed with influenza vaccination today and we will administer it today at the clinic.   Meningioma [facility]) She complained of dizziness She has not have MRI evaluation for 4 years I recommend repeat MRI and she agreed. I will call her with test results   Orders Placed This Encounter  Procedures  . MR Brain W Wo Contrast    Standing Status:   Future    Standing Expiration Date:   02/04/2020    Order Specific Question:   If indicated for the ordered procedure, I authorize the administration of contrast media per Radiology protocol    Answer:   Yes    Order Specific Question:   What is the patient's sedation requirement?    Answer:   No Sedation    Order Specific Question:   Does the patient have a pacemaker or implanted devices?    Answer:   No    Order Specific Question:   Use SRS Protocol?    Answer:   No    Order Specific Question:   Radiology Contrast Protocol - do NOT remove file path    Answer:   [email or web address]    Order Specific Question:   Preferred imaging location?    Answer:   [facility] (table limit-350 lbs)  . Flu Vaccine QUAD High Dose(Fluad)  . Comprehensive metabolic panel    Standing Status:   Future    Standing Expiration Date:   03/11/2020  . CBC  with Differential/Platelet    Standing Status:   Future    Standing Expiration Date:   03/11/2020    INTERVAL HISTORY: Please see below for problem oriented charting. She returns for annual follow-up She complained of occasional dizziness. No headaches or neurological deficits No recent lymphadenopathy, infection, fever or chills  SUMMARY OF ONCOLOGIC HISTORY: Oncology History Overview Note  --2005.  Living in [place], [place]. Non-Hodgkin's lymphoma, follicular cell origin, grade 1, CD10 and CD20 positive presenting as a right submandibular mass. PET scan showed stage  III disease.    --September, 2005 - March 2006.  Received 8 cycles of Rituxan,fludarabine, mitoxantrone and Decadron.  --March, 2006 - March, 2008.  Received maintenance Rituxan x 2 years.    --05/02/2011.   PET scan c/w NED     --06/22/2012.  CT of neck, chest, abdomen and pelvis, c/w NED. The patient is not on any therapy.   History of non-Hodgkin's lymphoma    REVIEW OF SYSTEMS:   Constitutional: Denies fevers, chills or abnormal weight loss Eyes: Denies blurriness of vision Ears, nose, mouth, throat, and face: Denies mucositis or sore throat Respiratory: Denies cough, dyspnea or wheezes Cardiovascular: Denies palpitation, chest discomfort or lower extremity swelling Gastrointestinal:  Denies nausea, heartburn or change in bowel habits Skin: Denies abnormal skin rashes Lymphatics: Denies new lymphadenopathy or easy bruising Neurological:Denies numbness, tingling or new weaknesses Behavioral/Psych: Mood is stable,  no new changes  All other systems were reviewed with the patient and are negative.  I have reviewed the past medical history, past surgical history, social history and family history with the patient and they are unchanged from previous note.  ALLERGIES:  has No Known Allergies.  MEDICATIONS:  Current Outpatient Medications  Medication Sig Dispense Refill  . ALPRAZolam (XANAX) 0.5 MG tablet Take 1  tablet (0.5 mg total) by mouth 2 (two) times daily as needed for anxiety. 60 tablet 2  . amLODipine (NORVASC) 10 MG tablet TK 1 T PO QD  4  . cetirizine (ZYRTEC) 10 MG tablet Take 1 tablet (10 mg total) by mouth daily. 90 tablet 3  . famotidine (PEPCID) 20 MG tablet Take 1 tablet (20 mg total) by mouth 2 (two) times daily. [id number] tablet 3  . folic acid (FOLVITE) A999333 MCG tablet Take 400 mcg by mouth daily.    . methotrexate (RHEUMATREX) 2.5 MG tablet Take 10 mg by mouth once a week. Tuesdays    . PARoxetine (PAXIL) 20 MG tablet Take 1 tablet (20 mg total) by mouth every morning. 90 tablet 3  . tiZANidine (ZANAFLEX) 4 MG tablet Take 1 tablet (4 mg total) by mouth every 8 (eight) hours as needed. 180 tablet 3  . traMADol (ULTRAM) 50 MG tablet Take 100 mg by mouth every 6 (six) hours as needed.   0  . traZODone (DESYREL) 50 MG tablet Take 0.5-1 tablets (25-50 mg total) by mouth at bedtime as needed for sleep. 30 tablet 5   No current facility-administered medications for this visit.     PHYSICAL EXAMINATION: ECOG PERFORMANCE STATUS: 1 - Symptomatic but completely ambulatory  Vitals:   02/04/19 1413  BP: (!) 150/82  Pulse: (!) 56  Resp: 18  Temp: 98.2 F (36.8 C)   Filed Weights   02/04/19 1413  Weight: 182 lb 12.8 oz (82.9 kg)    GENERAL:alert, no distress and comfortable SKIN: skin color, texture, turgor are normal, no rashes or significant lesions EYES: normal, Conjunctiva are pink and non-injected, sclera clear OROPHARYNX:no exudate, no erythema and lips, buccal mucosa, and tongue normal  NECK: supple, thyroid normal size, non-tender, without nodularity LYMPH:  no palpable lymphadenopathy in the cervical, axillary or inguinal LUNGS: clear to auscultation and percussion with normal breathing effort HEART: regular rate & rhythm and no murmurs and no lower extremity edema ABDOMEN:abdomen soft, non-tender and normal bowel sounds Musculoskeletal:no cyanosis of digits and no clubbing   NEURO: alert & oriented x 3 with fluent speech, no focal motor/sensory deficits  LABORATORY DATA:  I have reviewed the data as listed    Component Value Date/Time   NA 143 02/04/2019 1341   NA 141 10/03/2016 1141   K 4.4 02/04/2019 1341   K 3.6 10/03/2016 1141   CL 108 02/04/2019 1341   CL 103 06/22/2012 1045   CO2 26 02/04/2019 1341   CO2 29 10/03/2016 1141   GLUCOSE 93 02/04/2019 1341   GLUCOSE 100 10/03/2016 1141   GLUCOSE 92 06/22/2012 1045   BUN 13 02/04/2019 1341   BUN 15.6 10/03/2016 1141   CREATININE 0.84 02/04/2019 1341   CREATININE 0.8 10/03/2016 1141   CALCIUM 9.5 02/04/2019 1341   CALCIUM 10.4 10/03/2016 1141   PROT 5.9 (L) 02/04/2019 1341   PROT 6.0 (L) 10/03/2016 1141   ALBUMIN 4.1 02/04/2019 1341   ALBUMIN 4.0 10/03/2016 1141   AST 37 02/04/2019 1341   AST 25 10/03/2016 1141   ALT 41  02/04/2019 1341   ALT 32 10/03/2016 1141   ALKPHOS 82 02/04/2019 1341   ALKPHOS 70 10/03/2016 1141   BILITOT 0.9 02/04/2019 1341   BILITOT 1.05 10/03/2016 1141   GFRNONAA >60 02/04/2019 1341   GFRAA >60 02/04/2019 1341    No results found for: SPEP, UPEP  Lab Results  Component Value Date   WBC 7.2 02/04/2019   NEUTROABS 4.9 02/04/2019   HGB 13.0 02/04/2019   HCT 38.5 02/04/2019   MCV 91.7 02/04/2019   PLT 141 (L) 02/04/2019      Chemistry      Component Value Date/Time   NA 143 02/04/2019 1341   NA 141 10/03/2016 1141   K 4.4 02/04/2019 1341   K 3.6 10/03/2016 1141   CL 108 02/04/2019 1341   CL 103 06/22/2012 1045   CO2 26 02/04/2019 1341   CO2 29 10/03/2016 1141   BUN 13 02/04/2019 1341   BUN 15.6 10/03/2016 1141   CREATININE 0.84 02/04/2019 1341   CREATININE 0.8 10/03/2016 1141      Component Value Date/Time   CALCIUM 9.5 02/04/2019 1341   CALCIUM 10.4 10/03/2016 1141   ALKPHOS 82 02/04/2019 1341   ALKPHOS 70 10/03/2016 1141   AST 37 02/04/2019 1341   AST 25 10/03/2016 1141   ALT 41 02/04/2019 1341   ALT 32 10/03/2016 1141   BILITOT 0.9  02/04/2019 1341   BILITOT 1.05 10/03/2016 1141       All questions were answered. The patient knows to call the clinic with any problems, questions or concerns. No barriers to learning was detected.  I spent 15 minutes counseling the patient face to face. The total time spent in the appointment was 20 minutes and more than 50% was on counseling and review of test results  Heath Lark, MD 02/05/2019 7:57 AM

## 2019-02-05 NOTE — Assessment & Plan Note (Addendum)
She has no signs or symptoms to suggest cancer recurrence She is a long-term cancer survivor However, the patient wants to continue follow-up here once a year I will see her back next year for further follow-up We discussed the importance of preventive care and reviewed the vaccination programs. She does not have any prior allergic reactions to influenza vaccination. She agrees to proceed with influenza vaccination today and we will administer it today at the clinic.

## 2019-02-15 ENCOUNTER — Other Ambulatory Visit: Payer: Self-pay

## 2019-02-15 ENCOUNTER — Ambulatory Visit (HOSPITAL_COMMUNITY)
Admission: RE | Admit: 2019-02-15 | Discharge: 2019-02-15 | Disposition: A | Discharge status: Home / Self Care | Payer: Medicare HMO | Source: Ambulatory Visit | Attending: Hematology and Oncology | Admitting: Hematology and Oncology

## 2019-02-15 DIAGNOSIS — D329 Benign neoplasm of meninges, unspecified: Secondary | ICD-10-CM | POA: Insufficient documentation

## 2019-02-15 MED ORDER — GADOBUTROL 1 MMOL/ML IV SOLN
8.0000 mL | Freq: Once | INTRAVENOUS | Status: AC | PRN
  Administered 2019-02-15: 8 mL via INTRAVENOUS

## 2019-02-17 ENCOUNTER — Telehealth: Payer: Self-pay

## 2019-02-17 NOTE — Telephone Encounter (Signed)
-----   Message from Heath Lark, MD sent at 02/17/2019  8:18 AM EDT ----- Regarding: MRI brain is stable, pls let her know

## 2019-02-17 NOTE — Telephone Encounter (Signed)
Called and left a message asking her to call the office. 

## 2019-02-17 NOTE — Telephone Encounter (Signed)
Called and given below message. She verbalized understanding. 

## 2019-02-23 ENCOUNTER — Telehealth: Payer: Self-pay | Admitting: Hematology and Oncology

## 2019-02-23 NOTE — Telephone Encounter (Signed)
Scheduled appt per 9/23 sch message - mailed reminder letter with appt date and time

## 2019-04-12 DIAGNOSIS — R69 Illness, unspecified: Secondary | ICD-10-CM | POA: Diagnosis not present

## 2019-05-19 DIAGNOSIS — K219 Gastro-esophageal reflux disease without esophagitis: Secondary | ICD-10-CM | POA: Diagnosis not present

## 2019-05-19 DIAGNOSIS — E7800 Pure hypercholesterolemia, unspecified: Secondary | ICD-10-CM | POA: Diagnosis not present

## 2019-05-19 DIAGNOSIS — I10 Essential (primary) hypertension: Secondary | ICD-10-CM | POA: Diagnosis not present

## 2019-05-19 DIAGNOSIS — D329 Benign neoplasm of meninges, unspecified: Secondary | ICD-10-CM | POA: Diagnosis not present

## 2019-05-19 DIAGNOSIS — Z8572 Personal history of non-Hodgkin lymphomas: Secondary | ICD-10-CM | POA: Diagnosis not present

## 2019-05-19 DIAGNOSIS — Z0001 Encounter for general adult medical examination with abnormal findings: Secondary | ICD-10-CM | POA: Diagnosis not present

## 2019-05-19 DIAGNOSIS — G4700 Insomnia, unspecified: Secondary | ICD-10-CM | POA: Diagnosis not present

## 2019-06-09 DIAGNOSIS — Z79899 Other long term (current) drug therapy: Secondary | ICD-10-CM | POA: Diagnosis not present

## 2019-06-09 DIAGNOSIS — M064 Inflammatory polyarthropathy: Secondary | ICD-10-CM | POA: Diagnosis not present

## 2019-06-09 DIAGNOSIS — M150 Primary generalized (osteo)arthritis: Secondary | ICD-10-CM | POA: Diagnosis not present

## 2019-06-09 DIAGNOSIS — G8929 Other chronic pain: Secondary | ICD-10-CM | POA: Diagnosis not present

## 2019-06-09 DIAGNOSIS — M1990 Unspecified osteoarthritis, unspecified site: Secondary | ICD-10-CM | POA: Diagnosis not present

## 2019-06-09 DIAGNOSIS — T65891A Toxic effect of other specified substances, accidental (unintentional), initial encounter: Secondary | ICD-10-CM | POA: Diagnosis not present

## 2019-06-09 DIAGNOSIS — R5383 Other fatigue: Secondary | ICD-10-CM | POA: Diagnosis not present

## 2019-06-10 DIAGNOSIS — R69 Illness, unspecified: Secondary | ICD-10-CM | POA: Diagnosis not present

## 2019-06-24 DIAGNOSIS — H0288B Meibomian gland dysfunction left eye, upper and lower eyelids: Secondary | ICD-10-CM | POA: Diagnosis not present

## 2019-06-24 DIAGNOSIS — H35371 Puckering of macula, right eye: Secondary | ICD-10-CM | POA: Diagnosis not present

## 2019-06-24 DIAGNOSIS — H5213 Myopia, bilateral: Secondary | ICD-10-CM | POA: Diagnosis not present

## 2019-06-24 DIAGNOSIS — H0288A Meibomian gland dysfunction right eye, upper and lower eyelids: Secondary | ICD-10-CM | POA: Diagnosis not present

## 2019-06-24 DIAGNOSIS — H524 Presbyopia: Secondary | ICD-10-CM | POA: Diagnosis not present

## 2019-06-24 DIAGNOSIS — Z961 Presence of intraocular lens: Secondary | ICD-10-CM | POA: Diagnosis not present

## 2019-06-24 DIAGNOSIS — H52223 Regular astigmatism, bilateral: Secondary | ICD-10-CM | POA: Diagnosis not present

## 2019-10-07 DIAGNOSIS — Z79899 Other long term (current) drug therapy: Secondary | ICD-10-CM | POA: Diagnosis not present

## 2019-10-07 DIAGNOSIS — M1711 Unilateral primary osteoarthritis, right knee: Secondary | ICD-10-CM | POA: Diagnosis not present

## 2019-10-07 DIAGNOSIS — T65891A Toxic effect of other specified substances, accidental (unintentional), initial encounter: Secondary | ICD-10-CM | POA: Diagnosis not present

## 2019-10-07 DIAGNOSIS — R69 Illness, unspecified: Secondary | ICD-10-CM | POA: Diagnosis not present

## 2019-10-07 DIAGNOSIS — M797 Fibromyalgia: Secondary | ICD-10-CM | POA: Diagnosis not present

## 2019-10-07 DIAGNOSIS — M25561 Pain in right knee: Secondary | ICD-10-CM | POA: Diagnosis not present

## 2019-10-07 DIAGNOSIS — M1712 Unilateral primary osteoarthritis, left knee: Secondary | ICD-10-CM | POA: Diagnosis not present

## 2019-10-07 DIAGNOSIS — M1990 Unspecified osteoarthritis, unspecified site: Secondary | ICD-10-CM | POA: Diagnosis not present

## 2019-10-07 DIAGNOSIS — R5383 Other fatigue: Secondary | ICD-10-CM | POA: Diagnosis not present

## 2019-10-07 DIAGNOSIS — M064 Inflammatory polyarthropathy: Secondary | ICD-10-CM | POA: Diagnosis not present

## 2019-10-07 DIAGNOSIS — M25562 Pain in left knee: Secondary | ICD-10-CM | POA: Diagnosis not present

## 2019-10-11 DIAGNOSIS — R5381 Other malaise: Secondary | ICD-10-CM | POA: Diagnosis not present

## 2019-10-11 DIAGNOSIS — R69 Illness, unspecified: Secondary | ICD-10-CM | POA: Diagnosis not present

## 2019-10-11 DIAGNOSIS — M797 Fibromyalgia: Secondary | ICD-10-CM | POA: Diagnosis not present

## 2019-10-11 DIAGNOSIS — Z9181 History of falling: Secondary | ICD-10-CM | POA: Diagnosis not present

## 2019-11-09 DIAGNOSIS — R69 Illness, unspecified: Secondary | ICD-10-CM | POA: Diagnosis not present

## 2019-11-09 DIAGNOSIS — M797 Fibromyalgia: Secondary | ICD-10-CM | POA: Diagnosis not present

## 2019-11-09 DIAGNOSIS — R7401 Elevation of levels of liver transaminase levels: Secondary | ICD-10-CM | POA: Diagnosis not present

## 2019-11-16 DIAGNOSIS — Z0100 Encounter for examination of eyes and vision without abnormal findings: Secondary | ICD-10-CM | POA: Diagnosis not present

## 2019-11-25 ENCOUNTER — Ambulatory Visit: Payer: Medicare HMO | Admitting: Physical Therapy

## 2020-01-06 ENCOUNTER — Ambulatory Visit: Payer: Medicare HMO | Admitting: Physical Therapy

## 2020-01-10 ENCOUNTER — Ambulatory Visit: Payer: Medicare HMO | Admitting: Physical Therapy

## 2020-01-17 ENCOUNTER — Encounter: Payer: Medicare HMO | Admitting: Physical Therapy

## 2020-01-25 ENCOUNTER — Encounter: Payer: Medicare HMO | Admitting: Physical Therapy

## 2020-01-27 DIAGNOSIS — R5383 Other fatigue: Secondary | ICD-10-CM | POA: Diagnosis not present

## 2020-01-27 DIAGNOSIS — T65891A Toxic effect of other specified substances, accidental (unintentional), initial encounter: Secondary | ICD-10-CM | POA: Diagnosis not present

## 2020-01-27 DIAGNOSIS — Z79899 Other long term (current) drug therapy: Secondary | ICD-10-CM | POA: Diagnosis not present

## 2020-01-27 DIAGNOSIS — M797 Fibromyalgia: Secondary | ICD-10-CM | POA: Diagnosis not present

## 2020-01-27 DIAGNOSIS — R69 Illness, unspecified: Secondary | ICD-10-CM | POA: Diagnosis not present

## 2020-01-27 DIAGNOSIS — M064 Inflammatory polyarthropathy: Secondary | ICD-10-CM | POA: Diagnosis not present

## 2020-01-27 DIAGNOSIS — M1990 Unspecified osteoarthritis, unspecified site: Secondary | ICD-10-CM | POA: Diagnosis not present

## 2020-02-17 ENCOUNTER — Inpatient Hospital Stay: Payer: Medicare HMO

## 2020-02-17 ENCOUNTER — Inpatient Hospital Stay: Payer: Medicare HMO | Admitting: Hematology and Oncology

## 2020-02-29 DIAGNOSIS — Z23 Encounter for immunization: Secondary | ICD-10-CM | POA: Diagnosis not present

## 2020-04-24 DIAGNOSIS — R1031 Right lower quadrant pain: Secondary | ICD-10-CM | POA: Diagnosis not present

## 2020-04-24 DIAGNOSIS — D801 Nonfamilial hypogammaglobulinemia: Secondary | ICD-10-CM | POA: Diagnosis not present

## 2020-04-24 DIAGNOSIS — K219 Gastro-esophageal reflux disease without esophagitis: Secondary | ICD-10-CM | POA: Diagnosis not present

## 2020-04-24 DIAGNOSIS — Z0001 Encounter for general adult medical examination with abnormal findings: Secondary | ICD-10-CM | POA: Diagnosis not present

## 2020-04-24 DIAGNOSIS — Z79899 Other long term (current) drug therapy: Secondary | ICD-10-CM | POA: Diagnosis not present

## 2020-04-24 DIAGNOSIS — G4700 Insomnia, unspecified: Secondary | ICD-10-CM | POA: Diagnosis not present

## 2020-04-24 DIAGNOSIS — Z8572 Personal history of non-Hodgkin lymphomas: Secondary | ICD-10-CM | POA: Diagnosis not present

## 2020-04-24 DIAGNOSIS — I10 Essential (primary) hypertension: Secondary | ICD-10-CM | POA: Diagnosis not present

## 2020-04-24 DIAGNOSIS — R69 Illness, unspecified: Secondary | ICD-10-CM | POA: Diagnosis not present

## 2020-04-24 DIAGNOSIS — E559 Vitamin D deficiency, unspecified: Secondary | ICD-10-CM | POA: Diagnosis not present

## 2020-05-04 ENCOUNTER — Other Ambulatory Visit: Payer: Self-pay | Admitting: Family Medicine

## 2020-05-04 DIAGNOSIS — R1031 Right lower quadrant pain: Secondary | ICD-10-CM

## 2020-06-02 ENCOUNTER — Other Ambulatory Visit: Payer: Medicare HMO

## 2020-08-08 DIAGNOSIS — Z79899 Other long term (current) drug therapy: Secondary | ICD-10-CM | POA: Diagnosis not present

## 2020-08-08 DIAGNOSIS — M064 Inflammatory polyarthropathy: Secondary | ICD-10-CM | POA: Diagnosis not present

## 2020-08-08 DIAGNOSIS — R7989 Other specified abnormal findings of blood chemistry: Secondary | ICD-10-CM | POA: Diagnosis not present

## 2020-08-08 DIAGNOSIS — R69 Illness, unspecified: Secondary | ICD-10-CM | POA: Diagnosis not present

## 2020-08-08 DIAGNOSIS — M1990 Unspecified osteoarthritis, unspecified site: Secondary | ICD-10-CM | POA: Diagnosis not present

## 2020-08-08 DIAGNOSIS — M797 Fibromyalgia: Secondary | ICD-10-CM | POA: Diagnosis not present

## 2020-08-08 DIAGNOSIS — T65891A Toxic effect of other specified substances, accidental (unintentional), initial encounter: Secondary | ICD-10-CM | POA: Diagnosis not present

## 2020-08-16 DIAGNOSIS — Z79899 Other long term (current) drug therapy: Secondary | ICD-10-CM | POA: Diagnosis not present

## 2020-08-16 DIAGNOSIS — G8929 Other chronic pain: Secondary | ICD-10-CM | POA: Diagnosis not present

## 2020-08-16 DIAGNOSIS — M129 Arthropathy, unspecified: Secondary | ICD-10-CM | POA: Diagnosis not present

## 2020-08-16 DIAGNOSIS — C8590 Non-Hodgkin lymphoma, unspecified, unspecified site: Secondary | ICD-10-CM | POA: Diagnosis not present

## 2020-08-16 DIAGNOSIS — M5441 Lumbago with sciatica, right side: Secondary | ICD-10-CM | POA: Diagnosis not present

## 2020-08-16 DIAGNOSIS — E559 Vitamin D deficiency, unspecified: Secondary | ICD-10-CM | POA: Diagnosis not present

## 2020-08-16 DIAGNOSIS — M797 Fibromyalgia: Secondary | ICD-10-CM | POA: Diagnosis not present

## 2020-08-16 DIAGNOSIS — Z6834 Body mass index (BMI) 34.0-34.9, adult: Secondary | ICD-10-CM | POA: Diagnosis not present

## 2020-08-16 DIAGNOSIS — Z9181 History of falling: Secondary | ICD-10-CM | POA: Diagnosis not present

## 2020-10-03 DIAGNOSIS — C8590 Non-Hodgkin lymphoma, unspecified, unspecified site: Secondary | ICD-10-CM | POA: Diagnosis not present

## 2020-10-03 DIAGNOSIS — G8929 Other chronic pain: Secondary | ICD-10-CM | POA: Diagnosis not present

## 2020-10-03 DIAGNOSIS — Z79899 Other long term (current) drug therapy: Secondary | ICD-10-CM | POA: Diagnosis not present

## 2020-10-03 DIAGNOSIS — M5441 Lumbago with sciatica, right side: Secondary | ICD-10-CM | POA: Diagnosis not present

## 2020-10-03 DIAGNOSIS — M797 Fibromyalgia: Secondary | ICD-10-CM | POA: Diagnosis not present

## 2020-10-03 DIAGNOSIS — Z6834 Body mass index (BMI) 34.0-34.9, adult: Secondary | ICD-10-CM | POA: Diagnosis not present

## 2020-10-03 DIAGNOSIS — Z9181 History of falling: Secondary | ICD-10-CM | POA: Diagnosis not present

## 2020-10-05 ENCOUNTER — Other Ambulatory Visit: Payer: Self-pay

## 2020-10-05 ENCOUNTER — Encounter: Payer: Self-pay | Admitting: Physical Therapy

## 2020-10-05 ENCOUNTER — Ambulatory Visit: Payer: Medicare HMO | Attending: Family Medicine | Admitting: Physical Therapy

## 2020-10-05 DIAGNOSIS — R2681 Unsteadiness on feet: Secondary | ICD-10-CM | POA: Insufficient documentation

## 2020-10-05 DIAGNOSIS — M6281 Muscle weakness (generalized): Secondary | ICD-10-CM | POA: Diagnosis not present

## 2020-10-05 NOTE — Therapy (Signed)
[facility] [address], [place], [place], [address] Phone: [phone]   Fax:  [phone]  Physical Therapy Evaluation  Patient Details  Name: Belinda Mcdowell MRN: [id number] Date of Birth: 1940-11-07 Referring Provider (PT): Dibas Dorthy Cooler, MD   Encounter Date: 10/05/2020   PT End of Session - 10/05/20 1619    Visit Number 1    Date for PT Re-Evaluation 11/16/20    Authorization Type Medicare    Progress Note Due on Visit 10    PT Start Time 6962    PT Stop Time 1525    PT Time Calculation (min) 40 min    Activity Tolerance Patient tolerated treatment well    Behavior During Therapy [facility] for tasks assessed/performed           Past Medical History:  Diagnosis Date  . Abnormal SPEP 02/21/2015  . Allergic rhinitis, cause unspecified   . Allergy    states she has environmental allergies; and takes zyrtec  . Anemia, unspecified 11/18/2011   Prior on aranesp  . Anxiety   . Arthritis   . Chronic fatigue 02/21/2015  . 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 Degenerative joint disease 11/18/2011   diffuse  . Depression   . Fibromyalgia 11/18/2011  . Generalized headaches   . Hyperlipidemia   . Increased urinary frequency 02/21/2015  . Insomnia   . 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 Lumbar disc disease 11/18/2011  . Mucous retention cyst of maxillary sinus 11/18/2011   Right   . Non-Hodgkin lymphoma ([place]) 11/18/2011   Stage 3  . Psoriasis 11/18/2011   On MTX  . PUD (peptic ulcer disease) 11/18/2011   Duodenal at age 34 and 14  . PVC's (premature ventricular contractions)    by holter  previously thought to be A. fib  . Sinus bradycardia   . UTI (lower urinary tract infection)     Past Surgical History:  Procedure Laterality Date  . ABDOMINAL HYSTERECTOMY     for dysfunctional bleeding  . APPENDECTOMY      There were no vitals filed for this visit.    Subjective Assessment - 10/05/20  1448    Subjective Patient reporting due to decreased activity tolerance and strength of bil LE. Notes increased fatigue when ambulating average distance. Was sedentary for last two years due to [place]. Has felt this way approximately one year.    Pertinent History history of cancer, fibromyalgia    Limitations Walking;House hold activities    Patient Stated Goals to have more endurance; to have increased bil LE strength    Currently in Pain? No/denies              [facility] PT Assessment - 10/05/20 0001      Assessment   Medical Diagnosis R54 (ICD-10-CM) - Age-related physical debility    Referring Provider (PT) Dibas Koirala, MD    Onset Date/Surgical Date --   middle of last year   Hand Dominance Right    Next MD Visit 10/23/2020    Prior Therapy No      Precautions   Precautions Other (comment)    Precaution Comments history of cancer      Restrictions   Weight Bearing Restrictions No      Balance Screen   Has the patient fallen in the past 6 months No    Has the patient had a decrease in activity level because of a fear of falling?  Yes  Is the patient reluctant to leave their home because of a fear of falling?  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   Overall Cognitive Status Within Functional Limits for tasks assessed      Observation/Other Assessments   Other Surveys  --   BERG     Posture/Postural Control   Posture/Postural Control Postural limitations    Postural Limitations Weight shift left      ROM / Strength   AROM / PROM / Strength Strength      Strength   Strength Assessment Site Hip;Knee;Ankle    Right Hip Flexion 4/5    Right Hip Extension 4/5    Right Hip ABduction 4+/5    Right Hip ADduction 5/5    Left Hip Flexion 4/5     Left Hip Extension 4+/5    Left Hip ABduction 4-/5    Left Hip ADduction 5/5    Right/Left Knee Right;Left    Right Knee Flexion 4+/5    Right Knee Extension 4+/5    Left Knee Flexion 5/5    Left Knee Extension 5/5    Right Ankle Dorsiflexion 5/5    Right Ankle Plantar Flexion 5/5    Left Ankle Dorsiflexion 5/5    Left Ankle Plantar Flexion 5/5      Transfers   Five time sit to stand comments  17 seconds      Balance   Balance Assessed Yes      Standardized Balance Assessment   Standardized Balance Assessment Berg Balance Test      Berg Balance Test   Sit to Stand Able to stand without using hands and stabilize independently    Standing Unsupported Able to stand safely 2 minutes    Sitting with Back Unsupported but Feet Supported on Floor or Stool Able to sit safely and securely 2 minutes    Stand to Sit Sits safely with minimal use of hands    Transfers Able to transfer safely, minor use of hands    Standing Unsupported with Eyes Closed Able to stand 10 seconds with supervision    Standing Unsupported with Feet Together Able to place feet together independently and stand 1 minute safely    From Standing, Reach Forward with Outstretched Arm Can reach forward >12 cm safely (5")    From Standing Position, Pick up Object from Floor Able to pick up shoe safely and easily    From Standing Position, Turn to Look Behind Over each Shoulder Looks behind one side only/other side shows less weight shift    Turn 360 Degrees Able to turn 360 degrees safely one side only in 4 seconds or less    Standing Unsupported, Alternately Place Feet on Step/Stool Able to stand independently and complete 8 steps >20 seconds    Standing Unsupported, One Foot in Front Needs help to step but can hold 15 seconds    Standing on One Leg Able to lift leg independently and hold 5-10 seconds    Total Score 47                      Objective measurements completed on examination: See above findings.  PT Education - 10/05/20 1618    Education Details Access Code [email or web address]; bridge, standing terminal knee extension, sit to stand without UE, standing hip abduction, tandem stance in corner    Person(s) Educated Patient    Methods Explanation;Demonstration;Tactile cues;Verbal cues;Handout    Comprehension Verbalized understanding;Returned demonstration;Verbal cues required;Tactile cues required            PT Short Term Goals - 10/05/20 1530      PT SHORT TERM GOAL #1   Title Patient will be independent with HEP for continued progression at home.    Time 4    Period Weeks    Status New    Target Date 11/02/20      PT SHORT TERM GOAL #2   Title Patient will perform five time sit to stand in 14s or less to indicate decreased fall risk.    Baseline 17s    Time 4    Period Weeks    Status New    Target Date 11/02/20      PT SHORT TERM GOAL #3   Title --             PT Long Term Goals - 10/05/20 1532      PT LONG TERM GOAL #1   Title Patient will be independent with advanced HEP for long term management of symptoms post D/C.    Time 8    Period Weeks    Status New    Target Date 11/30/20      PT LONG TERM GOAL #2   Title Patient will score 51 or higher on BERG to indicate decreased fall risk.    Baseline 47    Time 8    Period Weeks    Status New    Target Date 11/30/20      PT LONG TERM GOAL #3   Title Patient will complete five time sit to stand in 13s or less to indicate improved LE muscular endurance for improved activity tolerance.    Time 8    Period Weeks    Status New    Target Date 11/30/20                  Plan - 10/05/20 1525    Clinical Impression Statement Patient is an 80 y/o female referred due to general debility. PMH includes history of cancer and fibromyalgia. Patient reported activity limitations include prolonged ambulation and change of direction due to fear of falling. She demonstrates bil LE strength  impairments. Patient completing five times sit to stand in 17s indicating increased fall risk. BERG score 47 further endorsing increased fall risk. Therapist noting delayed use of step strategy with loss of balance indicating impaired proprioceptive awareness. Would benefit from skilled therapeutic intervention to address impairments for improved activity tolerance and decreased fall risk.    Personal Factors and Comorbidities Comorbidity 2    Comorbidities history of cancer, fibromyalgia    Examination-Activity Limitations Locomotion Level    Examination-Participation Restrictions Cleaning;Community Activity;Laundry    Stability/Clinical Decision Making Stable/Uncomplicated    Clinical Decision Making Low    Rehab Potential Excellent    PT Frequency 1x / week    PT Duration 8 weeks    PT Treatment/Interventions ADLs/Self Care Home Management;Electrical Stimulation;Moist Heat;Gait training;Stair training;Functional mobility training;Therapeutic activities;Therapeutic exercise;Balance training;Neuromuscular re-education;Patient/family education;Taping;Spinal Manipulations;Joint Manipulations    PT Next Visit Plan review HEP; 6 minute walk test; begin functional based training for endurance    PT Home Exercise Plan Access Code  [email or web address] and Agree with Plan of Care Patient           Patient will benefit from skilled therapeutic intervention in order to improve the following deficits and impairments:  Decreased activity tolerance,Decreased balance,Decreased endurance,Decreased strength,Difficulty walking  Visit Diagnosis: Muscle weakness (generalized) - Plan: PT plan of care cert/re-cert  Unsteadiness on feet - Plan: PT plan of care cert/re-cert     Problem List Patient Active Problem List   Diagnosis Date Noted  . Serum sickness due to drug 07/19/2015  . Meningioma ([place]) 03/12/2015  . 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 Abnormal SPEP 02/21/2015  . 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 Increased urinary frequency 02/21/2015  . Chronic fatigue 02/21/2015  . Mouth pain 12/02/2014  . General weakness 12/02/2014  . Dysuria 04/19/2014  . Right flank pain 04/19/2014  . Urinary frequency 12/10/2013  . Essential hypertension 03/19/2013  . Preventative health care 12/08/2012  . Conjunctivitis, allergic 06/10/2012  . Exercise intolerance 02/25/2012  . History of non-Hodgkin's lymphoma 11/18/2011  . Fibromyalgia 11/18/2011  . Mucous retention cyst of maxillary sinus 11/18/2011  . Psoriasis 11/18/2011  . Anemia, unspecified 11/18/2011  . PUD (peptic ulcer disease) 11/18/2011  . Degenerative joint disease 11/18/2011  . Lumbar disc disease 11/18/2011  . Allergic rhinitis   . Depression   . Insomnia   . Hyperlipidemia   . Anxiety   . Palpitation-Previously thought to be atrial fibrillation now with Holter data to suggest PVCs    Everardo All PT, DPT  10/05/20 4:24 PM     [facility] [address] [place], [place], [address] Phone: [phone]   Fax:  [phone]  Name: Belinda Mcdowell MRN: [id number] Date of Birth: 09-26-1940

## 2020-10-05 NOTE — Patient Instructions (Signed)
Access Code: [email or web address] URL: [email or web address] Date: 10/05/2020 Prepared by: Everardo All  Exercises Standing Terminal Knee Extension at [employer] with Diona Foley - 1 x daily - 7 x weekly - 2 sets - 12 reps Sit to Stand Without Arm Support - 1 x daily - 7 x weekly - 2 sets - 10 reps Standing Hip Abduction with Counter Support - 1 x daily - 7 x weekly - 2 sets - 12 reps Tandem Stance in Corner - 1 x daily - 7 x weekly - 1 sets - 2-3 reps - 30s hold Supine Bridge - 1 x daily - 7 x weekly - 2 sets - 12 reps

## 2020-10-11 ENCOUNTER — Other Ambulatory Visit: Payer: Self-pay

## 2020-10-11 ENCOUNTER — Ambulatory Visit: Payer: Medicare HMO | Admitting: Physical Therapy

## 2020-10-11 DIAGNOSIS — R2681 Unsteadiness on feet: Secondary | ICD-10-CM

## 2020-10-11 DIAGNOSIS — M6281 Muscle weakness (generalized): Secondary | ICD-10-CM

## 2020-10-11 NOTE — Therapy (Signed)
[facility] [address] [place], [place], [address] Phone: [phone]   Fax:  [phone]  Physical Therapy Treatment  Patient Details  Name: Belinda Mcdowell MRN: [id number] Date of Birth: 28-Mar-1941 Referring Provider (PT): Dibas Dorthy Cooler, MD   Encounter Date: 10/11/2020   PT End of Session - 10/11/20 1752    Visit Number 2    Date for PT Re-Evaluation 11/16/20    Authorization Type Medicare    Progress Note Due on Visit 10    PT Start Time 1615    PT Stop Time 1654    PT Time Calculation (min) 39 min    Activity Tolerance Patient tolerated treatment well    Behavior During Therapy [facility] for tasks assessed/performed           Past Medical History:  Diagnosis Date  . Abnormal SPEP 02/21/2015  . Allergic rhinitis, cause unspecified   . Allergy    states she has environmental allergies; and takes zyrtec  . Anemia, unspecified 11/18/2011   Prior on aranesp  . Anxiety   . Arthritis   . Chronic fatigue 02/21/2015  . 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 Degenerative joint disease 11/18/2011   diffuse  . Depression   . Fibromyalgia 11/18/2011  . Generalized headaches   . Hyperlipidemia   . Increased urinary frequency 02/21/2015  . Insomnia   . 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 Lumbar disc disease 11/18/2011  . Mucous retention cyst of maxillary sinus 11/18/2011   Right   . Non-Hodgkin lymphoma ([place]) 11/18/2011   Stage 3  . Psoriasis 11/18/2011   On MTX  . PUD (peptic ulcer disease) 11/18/2011   Duodenal at age 77 and 17  . PVC's (premature ventricular contractions)    by holter  previously thought to be A. fib  . Sinus bradycardia   . UTI (lower urinary tract infection)     Past Surgical History:  Procedure Laterality Date  . ABDOMINAL HYSTERECTOMY     for dysfunctional bleeding  . APPENDECTOMY      There were no vitals filed for this visit.   Subjective Assessment - 10/11/20 1749     Subjective Reports daily compliance with HEP.    Pertinent History history of cancer, fibromyalgia    Limitations Walking;House hold activities    Patient Stated Goals to have more endurance; to have increased bil LE strength    Currently in Pain? No/denies                             [facility] Adult PT Treatment/Exercise - 10/11/20 0001      Neuro Re-ed    Neuro Re-ed Details  modified tandem; black foam, 2 x 30s; tandem on firm surface, 2 x 30s, 2 finger hold; alternating cone taps      Lumbar Exercises: Seated   Other Seated Lumbar Exercises seated on BOSU; sashes yellow tband; x10 Rt/Lt    Other Seated Lumbar Exercises seated on BOSU chops; blue ball; x10 Rt/Lt      Knee/Hip Exercises: Aerobic   Nustep Level 1 x 8 minutes; PT present to discuss progress      Knee/Hip Exercises: Standing   SLS with Vectors blue loop at ankles; lateral step x10 Rt/Lt; forward step x10 Rt/Lt      Knee/Hip Exercises: Seated   Marching Both;1 set;10 reps    Marching Limitations seated on BOSU  Sit to Sand 10 reps;without UE support   3lb dumbbell     Shoulder Exercises: Standing   Row Weight (lbs) bent over row; 3lbs; x10 repetitions Rt/Lt                    PT Short Term Goals - 10/11/20 1752      PT SHORT TERM GOAL #1   Title Patient will be independent with HEP for continued progression at home.    Time 4    Period Weeks    Status On-going    Target Date 11/02/20             PT Long Term Goals - 10/05/20 1532      PT LONG TERM GOAL #1   Title Patient will be independent with advanced HEP for long term management of symptoms post D/C.    Time 8    Period Weeks    Status New    Target Date 11/30/20      PT LONG TERM GOAL #2   Title Patient will score 51 or higher on BERG to indicate decreased fall risk.    Baseline 47    Time 8    Period Weeks    Status New    Target Date 11/30/20      PT LONG TERM GOAL #3   Title Patient will complete five  time sit to stand in 13s or less to indicate improved LE muscular endurance for improved activity tolerance.    Time 8    Period Weeks    Status New    Target Date 11/30/20                 Plan - 10/11/20 1749    Clinical Impression Statement Patient experiencing loss of balance x2 when performing lateral step activity and requiring min A to prevent fall. Verbal cues for erect posture when performing seated BOSU activities. Would benefit from continued skilled intervention to address impairments for improved functional activity tolerance and decreased fall risk.    Personal Factors and Comorbidities Comorbidity 2    Comorbidities history of cancer, fibromyalgia    Examination-Activity Limitations Locomotion Level    Examination-Participation Restrictions Cleaning;Community Activity;Laundry    Rehab Potential Excellent    PT Frequency 1x / week    PT Duration 8 weeks    PT Treatment/Interventions ADLs/Self Care Home Management;Electrical Stimulation;Moist Heat;Gait training;Stair training;Functional mobility training;Therapeutic activities;Therapeutic exercise;Balance training;Neuromuscular re-education;Patient/family education;Taping;Spinal Manipulations;Joint Manipulations    PT Next Visit Plan 6 minute walk test; continue functional training    PT Home Exercise Plan Access Code [email or web address]    MVHQIONGE and Agree with Plan of Care Patient           Patient will benefit from skilled therapeutic intervention in order to improve the following deficits and impairments:  Decreased activity tolerance,Decreased balance,Decreased endurance,Decreased strength,Difficulty walking  Visit Diagnosis: Muscle weakness (generalized)  Unsteadiness on feet     Problem List Patient Active Problem List   Diagnosis Date Noted  . Serum sickness due to drug 07/19/2015  . Meningioma ([place]) 03/12/2015  . 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 Abnormal SPEP 02/21/2015  . 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 Increased urinary frequency 02/21/2015  . Chronic fatigue 02/21/2015  . Mouth pain 12/02/2014  . General weakness 12/02/2014  . Dysuria 04/19/2014  . Right flank pain 04/19/2014  . Urinary frequency 12/10/2013  . Essential hypertension 03/19/2013  .  Preventative health care 12/08/2012  . Conjunctivitis, allergic 06/10/2012  . Exercise intolerance 02/25/2012  . History of non-Hodgkin's lymphoma 11/18/2011  . Fibromyalgia 11/18/2011  . Mucous retention cyst of maxillary sinus 11/18/2011  . Psoriasis 11/18/2011  . Anemia, unspecified 11/18/2011  . PUD (peptic ulcer disease) 11/18/2011  . Degenerative joint disease 11/18/2011  . Lumbar disc disease 11/18/2011  . Allergic rhinitis   . Depression   . Insomnia   . Hyperlipidemia   . Anxiety   . Palpitation-Previously thought to be atrial fibrillation now with Holter data to suggest PVCs    Everardo All PT, DPT  10/11/20 5:53 PM   [facility] [address] [place], [place], [address] Phone: [phone]   Fax:  [phone]  Name: Belinda Mcdowell MRN: [id number] Date of Birth: [birth year]

## 2020-10-16 ENCOUNTER — Encounter: Payer: Medicare HMO | Admitting: Physical Therapy

## 2020-10-24 DIAGNOSIS — G4700 Insomnia, unspecified: Secondary | ICD-10-CM | POA: Diagnosis not present

## 2020-10-24 DIAGNOSIS — I10 Essential (primary) hypertension: Secondary | ICD-10-CM | POA: Diagnosis not present

## 2020-10-24 DIAGNOSIS — R69 Illness, unspecified: Secondary | ICD-10-CM | POA: Diagnosis not present

## 2020-10-24 DIAGNOSIS — M79662 Pain in left lower leg: Secondary | ICD-10-CM | POA: Diagnosis not present

## 2020-10-24 DIAGNOSIS — E7800 Pure hypercholesterolemia, unspecified: Secondary | ICD-10-CM | POA: Diagnosis not present

## 2020-10-24 DIAGNOSIS — Z79899 Other long term (current) drug therapy: Secondary | ICD-10-CM | POA: Diagnosis not present

## 2020-10-24 DIAGNOSIS — Z8572 Personal history of non-Hodgkin lymphomas: Secondary | ICD-10-CM | POA: Diagnosis not present

## 2020-10-24 DIAGNOSIS — M797 Fibromyalgia: Secondary | ICD-10-CM | POA: Diagnosis not present

## 2020-10-24 DIAGNOSIS — M79661 Pain in right lower leg: Secondary | ICD-10-CM | POA: Diagnosis not present

## 2020-10-25 ENCOUNTER — Other Ambulatory Visit: Payer: Self-pay

## 2020-10-25 ENCOUNTER — Encounter: Payer: Self-pay | Admitting: Physical Therapy

## 2020-10-25 ENCOUNTER — Ambulatory Visit: Payer: Medicare HMO | Attending: Family Medicine | Admitting: Physical Therapy

## 2020-10-25 DIAGNOSIS — M6281 Muscle weakness (generalized): Secondary | ICD-10-CM | POA: Insufficient documentation

## 2020-10-25 DIAGNOSIS — R2681 Unsteadiness on feet: Secondary | ICD-10-CM | POA: Diagnosis not present

## 2020-10-25 NOTE — Therapy (Signed)
[facility] [address], [place], [place], [address] Phone: [phone]   Fax:  [phone]  Physical Therapy Treatment  Patient Details  Name: Belinda Mcdowell MRN: [id number] Date of Birth: 12-Jun-1940 Referring Provider (PT): Dibas Dorthy Cooler, MD   Encounter Date: 10/25/2020   PT End of Session - 10/25/20 1456    Visit Number 3    Date for PT Re-Evaluation 11/16/20    Authorization Type Medicare    Progress Note Due on Visit 10    PT Start Time 1456   pt late   PT Stop Time 1528    PT Time Calculation (min) 32 min    Activity Tolerance Patient tolerated treatment well    Behavior During Therapy [facility] for tasks assessed/performed           Past Medical History:  Diagnosis Date  . Abnormal SPEP 02/21/2015  . Allergic rhinitis, cause unspecified   . Allergy    states she has environmental allergies; and takes zyrtec  . Anemia, unspecified 11/18/2011   Prior on aranesp  . Anxiety   . Arthritis   . Chronic fatigue 02/21/2015  . 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 Degenerative joint disease 11/18/2011   diffuse  . Depression   . Fibromyalgia 11/18/2011  . Generalized headaches   . Hyperlipidemia   . Increased urinary frequency 02/21/2015  . Insomnia   . 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 Lumbar disc disease 11/18/2011  . Mucous retention cyst of maxillary sinus 11/18/2011   Right   . Non-Hodgkin lymphoma ([place]) 11/18/2011   Stage 3  . Psoriasis 11/18/2011   On MTX  . PUD (peptic ulcer disease) 11/18/2011   Duodenal at age 73 and 69  . PVC's (premature ventricular contractions)    by holter  previously thought to be A. fib  . Sinus bradycardia   . UTI (lower urinary tract infection)     Past Surgical History:  Procedure Laterality Date  . ABDOMINAL HYSTERECTOMY     for dysfunctional bleeding  . APPENDECTOMY      There were no vitals filed for this visit.   Subjective Assessment -  10/25/20 1457    Subjective Pt late, "I'm running late today."    Pertinent History history of cancer, fibromyalgia    Currently in Pain? No/denies    Multiple Pain Sites No                             [facility] Adult PT Treatment/Exercise - 10/25/20 0001      Knee/Hip Exercises: Aerobic   Nustep L2 x 10 min with PTA present to monitor.      Knee/Hip Exercises: Standing   Heel Raises Both;1 set;15 reps    Hip Flexion AROM;Stengthening;Both;1 set;10 reps;Knee bent   Light UE   Hip Abduction AROM;Stengthening;Both;1 set;10 reps;Knee straight    Abduction Limitations VC to keep legs straighter    Hip Extension --   Step back/weight transfer 10x Bil Light UE   Other Standing Knee Exercises 5#KB modified dead lifts off stool 10x      Knee/Hip Exercises: Seated   Sit to Sand 1 set;10 reps;without UE support   holding 5#                 PT Education - 10/25/20 1519    Education Details How to begin Tree Pose again at her home  Person(s) Educated Patient    Methods Explanation;Demonstration;Verbal cues    Comprehension Verbalized understanding            PT Short Term Goals - 10/25/20 1509      PT SHORT TERM GOAL #1   Title Patient will be independent with HEP for continued progression at home.    Time 4    Period Weeks    Status Achieved    Target Date 11/02/20             PT Long Term Goals - 10/05/20 1532      PT LONG TERM GOAL #1   Title Patient will be independent with advanced HEP for long term management of symptoms post D/C.    Time 8    Period Weeks    Status New    Target Date 11/30/20      PT LONG TERM GOAL #2   Title Patient will score 51 or higher on BERG to indicate decreased fall risk.    Baseline 47    Time 8    Period Weeks    Status New    Target Date 11/30/20      PT LONG TERM GOAL #3   Title Patient will complete five time sit to stand in 13s or less to indicate improved LE muscular endurance for improved activity  tolerance.    Time 8    Period Weeks    Status New    Target Date 11/30/20                 Plan - 10/25/20 1512    Clinical Impression Statement Pt arrived late for todays appointment. She reports doing her HEP, meeting STG #1 and feels it is helping her "get back." She did refer to back pain a few times as a limiting factor at home when doing ADLs. There were no reports of back pain during session today. Pt was encouraged to hold onto the bar less when performing her hip kicks. Pt was able to take a nice size step backwards to transfer her weight with no LOB but slight UE/touching for confidence.    Personal Factors and Comorbidities Comorbidity 2    Comorbidities history of cancer, fibromyalgia    Examination-Activity Limitations Locomotion Level    Examination-Participation Restrictions Cleaning;Community Activity;Laundry    Stability/Clinical Decision Making Stable/Uncomplicated    Rehab Potential Excellent    PT Frequency 1x / week    PT Duration 8 weeks    PT Treatment/Interventions ADLs/Self Care Home Management;Electrical Stimulation;Moist Heat;Gait training;Stair training;Functional mobility training;Therapeutic activities;Therapeutic exercise;Balance training;Neuromuscular re-education;Patient/family education;Taping;Spinal Manipulations;Joint Manipulations    PT Next Visit Plan Test sit to stand for goals, continue with functional training    PT Home Exercise Plan Access Code [email or web address]    Consulted and Agree with Plan of Care Patient           Patient will benefit from skilled therapeutic intervention in order to improve the following deficits and impairments:  Decreased activity tolerance,Decreased balance,Decreased endurance,Decreased strength,Difficulty walking  Visit Diagnosis: Muscle weakness (generalized)  Unsteadiness on feet     Problem List Patient Active Problem List   Diagnosis Date Noted  . Serum sickness due to drug 07/19/2015  . Meningioma ([place])  03/12/2015  . 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 Abnormal SPEP 02/21/2015  . 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 Increased urinary frequency 02/21/2015  . Chronic fatigue 02/21/2015  . Mouth  pain 12/02/2014  . General weakness 12/02/2014  . Dysuria 04/19/2014  . Right flank pain 04/19/2014  . Urinary frequency 12/10/2013  . Essential hypertension 03/19/2013  . Preventative health care 12/08/2012  . Conjunctivitis, allergic 06/10/2012  . Exercise intolerance 02/25/2012  . History of non-Hodgkin's lymphoma 11/18/2011  . Fibromyalgia 11/18/2011  . Mucous retention cyst of maxillary sinus 11/18/2011  . Psoriasis 11/18/2011  . Anemia, unspecified 11/18/2011  . PUD (peptic ulcer disease) 11/18/2011  . Degenerative joint disease 11/18/2011  . Lumbar disc disease 11/18/2011  . Allergic rhinitis   . Depression   . Insomnia   . Hyperlipidemia   . Anxiety   . Palpitation-Previously thought to be atrial fibrillation now with Holter data to suggest PVCs     Tamyra Fojtik, PTA 10/25/2020, 3:29 PM  [facility] [address] [place], [place], [address] Phone: [phone]   Fax:  [phone]  Name: Belinda Mcdowell MRN: [id number] Date of Birth: Jul 06, 1940

## 2020-10-26 ENCOUNTER — Inpatient Hospital Stay (HOSPITAL_COMMUNITY): Admission: RE | Admit: 2020-10-26 | Payer: Medicare HMO | Source: Ambulatory Visit

## 2020-10-26 ENCOUNTER — Ambulatory Visit (HOSPITAL_COMMUNITY): Payer: Medicare HMO

## 2020-10-26 ENCOUNTER — Other Ambulatory Visit (HOSPITAL_COMMUNITY): Payer: Self-pay | Admitting: Family Medicine

## 2020-10-26 DIAGNOSIS — M79606 Pain in leg, unspecified: Secondary | ICD-10-CM

## 2020-10-30 ENCOUNTER — Other Ambulatory Visit: Payer: Self-pay

## 2020-10-30 ENCOUNTER — Encounter: Payer: Self-pay | Admitting: Physical Therapy

## 2020-10-30 ENCOUNTER — Ambulatory Visit: Payer: Medicare HMO | Admitting: Physical Therapy

## 2020-10-30 DIAGNOSIS — M6281 Muscle weakness (generalized): Secondary | ICD-10-CM

## 2020-10-30 DIAGNOSIS — R2681 Unsteadiness on feet: Secondary | ICD-10-CM

## 2020-10-30 NOTE — Therapy (Signed)
[facility] [address], [place], [place], [address] Phone: [phone]   Fax:  [phone]  Physical Therapy Treatment  Patient Details  Name: Belinda Mcdowell MRN: [id number] Date of Birth: Aug 08, 1940 Referring Provider (PT): Dibas Dorthy Cooler, MD   Encounter Date: 10/30/2020   PT End of Session - 10/30/20 1453    Visit Number 4    Date for PT Re-Evaluation 11/16/20    Authorization Type Medicare    Progress Note Due on Visit 10    PT Start Time 1451   little late   PT Stop Time 1530    PT Time Calculation (min) 39 min    Activity Tolerance Patient tolerated treatment well    Behavior During Therapy [facility] for tasks assessed/performed           Past Medical History:  Diagnosis Date  . Abnormal SPEP 02/21/2015  . Allergic rhinitis, cause unspecified   . Allergy    states she has environmental allergies; and takes zyrtec  . Anemia, unspecified 11/18/2011   Prior on aranesp  . Anxiety   . Arthritis   . Chronic fatigue 02/21/2015  . 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 Degenerative joint disease 11/18/2011   diffuse  . Depression   . Fibromyalgia 11/18/2011  . Generalized headaches   . Hyperlipidemia   . Increased urinary frequency 02/21/2015  . Insomnia   . 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 Lumbar disc disease 11/18/2011  . Mucous retention cyst of maxillary sinus 11/18/2011   Right   . Non-Hodgkin lymphoma ([place]) 11/18/2011   Stage 3  . Psoriasis 11/18/2011   On MTX  . PUD (peptic ulcer disease) 11/18/2011   Duodenal at age 60 and 33  . PVC's (premature ventricular contractions)    by holter  previously thought to be A. fib  . Sinus bradycardia   . UTI (lower urinary tract infection)     Past Surgical History:  Procedure Laterality Date  . ABDOMINAL HYSTERECTOMY     for dysfunctional bleeding  . APPENDECTOMY      There were no vitals filed for this visit.   Subjective Assessment -  10/30/20 1454    Subjective I wa a good sore after last time. Lasted 24 hours.    Pertinent History history of cancer, fibromyalgia    Limitations Walking;House hold activities    Patient Stated Goals to have more endurance; to have increased bil LE strength    Currently in Pain? No/denies    Multiple Pain Sites No              [facility] PT Assessment - 10/30/20 0001      Transfers   Five time sit to stand comments  13 sec                         [facility] Adult PT Treatment/Exercise - 10/30/20 0001      Neuro Re-ed    Neuro Re-ed Details  Tandem stance ( large step forward ) 30 sec 2x Bil SBA      Knee/Hip Exercises: Aerobic   Nustep L2 x 10 min with PTA present to monitor.      Knee/Hip Exercises: Standing   Heel Raises Both;1 set;20 reps    Knee Flexion Strengthening;Both;1 set;10 reps;Other (comment)    Knee Flexion Limitations 2#    Hip Flexion Stengthening;Both;1 set;10 reps;Knee bent;Other (comment)    Hip Flexion Limitations 2#  Hip Abduction Stengthening;Both;1 set;10 reps;Knee straight;Other (comment)    Abduction Limitations 2#      Knee/Hip Exercises: Seated   Long Arc Quad Strengthening;Both;1 set;10 reps;Weights    Long Arc Quad Weight 2 lbs.                  PT Education - 10/30/20 1612    Education Details HEP    Methods Explanation;Demonstration;Verbal cues    Comprehension Returned demonstration;Verbalized understanding            PT Short Term Goals - 10/30/20 1457      PT SHORT TERM GOAL #2   Title Patient will perform five time sit to stand in 14s or less to indicate decreased fall risk.    Time 4    Period Weeks    Status Achieved   13sec   Target Date 11/02/20             PT Long Term Goals - 10/05/20 1532      PT LONG TERM GOAL #1   Title Patient will be independent with advanced HEP for long term management of symptoms post D/C.    Time 8    Period Weeks    Status New    Target Date 11/30/20      PT  LONG TERM GOAL #2   Title Patient will score 51 or higher on BERG to indicate decreased fall risk.    Baseline 47    Time 8    Period Weeks    Status New    Target Date 11/30/20      PT LONG TERM GOAL #3   Title Patient will complete five time sit to stand in 13s or less to indicate improved LE muscular endurance for improved activity tolerance.    Time 8    Period Weeks    Status New    Target Date 11/30/20                 Plan - 10/30/20 1453    Clinical Impression Statement Pt reports compliance with HEp including increased confidence to try some of her old yoga poses like Tree Pose ( which she did/practiced.) Pt met 5x sit to stand STG today, achieving 14 sec. HEP was slightly advanced today. Pt was able to take a rather larger step forward into a modified tandem stance and hold her balance 30 sec without holding on.    Personal Factors and Comorbidities Comorbidity 2    Comorbidities history of cancer, fibromyalgia    Examination-Activity Limitations Locomotion Level    Examination-Participation Restrictions Cleaning;Community Activity;Laundry    Stability/Clinical Decision Making Stable/Uncomplicated    Rehab Potential Excellent    PT Frequency 1x / week    PT Duration 8 weeks    PT Treatment/Interventions ADLs/Self Care Home Management;Electrical Stimulation;Moist Heat;Gait training;Stair training;Functional mobility training;Therapeutic activities;Therapeutic exercise;Balance training;Neuromuscular re-education;Patient/family education;Taping;Spinal Manipulations;Joint Manipulations    PT Next Visit Plan BERG in next 1-2 visits    PT Home Exercise Plan Access Code [email or web address]    [id number] and Agree with Plan of Care Patient           Patient will benefit from skilled therapeutic intervention in order to improve the following deficits and impairments:  Decreased activity tolerance,Decreased balance,Decreased endurance,Decreased strength,Difficulty walking  Visit  Diagnosis: Muscle weakness (generalized)  Unsteadiness on feet     Problem List Patient Active Problem List   Diagnosis Date Noted  . Serum sickness due to drug 07/19/2015  .  Meningioma ([place]) 03/12/2015  . 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 Abnormal SPEP 02/21/2015  . 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 Increased urinary frequency 02/21/2015  . Chronic fatigue 02/21/2015  . Mouth pain 12/02/2014  . General weakness 12/02/2014  . Dysuria 04/19/2014  . Right flank pain 04/19/2014  . Urinary frequency 12/10/2013  . Essential hypertension 03/19/2013  . Preventative health care 12/08/2012  . Conjunctivitis, allergic 06/10/2012  . Exercise intolerance 02/25/2012  . History of non-Hodgkin's lymphoma 11/18/2011  . Fibromyalgia 11/18/2011  . Mucous retention cyst of maxillary sinus 11/18/2011  . Psoriasis 11/18/2011  . Anemia, unspecified 11/18/2011  . PUD (peptic ulcer disease) 11/18/2011  . Degenerative joint disease 11/18/2011  . Lumbar disc disease 11/18/2011  . Allergic rhinitis   . Depression   . Insomnia   . Hyperlipidemia   . Anxiety   . Palpitation-Previously thought to be atrial fibrillation now with Holter data to suggest PVCs     Davison Ohms, PTA 10/30/2020, 4:12 PM  [facility] [address] [place], [place], [address] Phone: [phone]   Fax:  [phone]  Name: Belinda Mcdowell MRN: [id number] Date of Birth: 03-17-1941

## 2020-11-06 ENCOUNTER — Ambulatory Visit: Payer: Medicare HMO | Admitting: Physical Therapy

## 2020-11-06 ENCOUNTER — Other Ambulatory Visit: Payer: Self-pay

## 2020-11-06 DIAGNOSIS — M6281 Muscle weakness (generalized): Secondary | ICD-10-CM | POA: Diagnosis not present

## 2020-11-06 DIAGNOSIS — R2681 Unsteadiness on feet: Secondary | ICD-10-CM | POA: Diagnosis not present

## 2020-11-06 NOTE — Therapy (Signed)
[facility] [address] [place], [place], [place], [address] Phone: [phone]   Fax:  [phone]  Physical Therapy Treatment  Patient Details  Name: Belinda Mcdowell MRN: [id number] Date of Birth: 1940-10-10 Referring Provider (PT): Dibas Dorthy Cooler, MD   Encounter Date: 11/06/2020   PT End of Session - 11/06/20 1619     Visit Number 5    Date for PT Re-Evaluation 11/16/20    Authorization Type Medicare    Progress Note Due on Visit 10    PT Start Time 1530    PT Stop Time 1610    PT Time Calculation (min) 40 min    Activity Tolerance Patient tolerated treatment well    Behavior During Therapy 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 fib   Sinus bradycardia    UTI (lower urinary tract infection)     Past Surgical History:  Procedure Laterality Date   ABDOMINAL HYSTERECTOMY     for dysfunctional bleeding   APPENDECTOMY      There were no vitals filed for this visit.   Subjective Assessment - 11/06/20 1616     Subjective  Patient reports "good" soreness following last session. States that family has noticed improvement with regards to mobility.    Pertinent History history of cancer, fibromyalgia    Limitations Walking;House hold activities    Patient Stated Goals to have more endurance; to have increased bil LE strength    Currently in Pain? No/denies                [facility] PT Assessment - 11/06/20 0001       Berg Balance Test   Sit to Stand Able to stand without using hands and stabilize independently    Standing Unsupported Able to stand safely 2 minutes    Sitting with Back Unsupported but Feet Supported on Floor or Stool Able to sit safely and securely 2 minutes    Stand to Sit Sits safely with minimal use of hands    Transfers Able to transfer safely, minor use of hands    Standing Unsupported with Eyes Closed Able to stand 10 seconds safely    Standing Unsupported with Feet Together Able to place feet together independently and stand 1 minute safely    From Standing, Reach Forward with Outstretched Arm Can reach forward >12 cm safely (5")    From Standing Position, Pick up Object  from Floor Able to pick up shoe safely and easily    From Standing Position, Turn to Look Behind Over each Shoulder Looks behind one side only/other side shows less weight shift    Turn 360 Degrees Able to turn 360 degrees safely in 4 seconds or less    Standing Unsupported, Alternately Place Feet on Step/Stool Able to stand independently and safely and complete 8 steps in 20 seconds    Standing Unsupported, One Foot in Front Able to take small step independently and hold 30 seconds    Standing on One Leg Able to lift leg independently and hold 5-10 seconds    Total Score 51                           [facility] Adult PT Treatment/Exercise - 11/06/20 0001       Transfers   Five time sit to stand comments  --   13.8 seconds     Neuro Re-ed    Neuro Re-ed Details  dot taps: fwd 45 degrees, lateral, back  45 degrees      Knee/Hip Exercises: Aerobic   Nustep L2 x 10 min; PT present to discuss progress and assess response to activity      Knee/Hip Exercises: Standing   Heel Raises Both;1 set;15 reps    Hip Flexion Stengthening;Both;1 set;10 reps;Knee bent    Hip Flexion Limitations 1.5#    Hip Extension Stengthening;Both;1 set;10 reps    Extension Limitations 1.5# at ankles; VC for glute pre-activation prior to movement    Other Standing Knee Exercises resisted walking at power tower; 15#; x3 fwd/Rt lateral/Lt lateral    Other Standing Knee Exercises 5#KB modified dead lifts from floor x10 repetitions      Knee/Hip Exercises: Seated   Sit to Sand 1 set;10 reps;without UE support   5# kettlebell                     PT Short Term Goals - 10/30/20 1457       PT SHORT TERM GOAL #2   Title Patient will perform five time sit to stand in 14s or less to indicate decreased fall risk.    Time 4    Period Weeks    Status Achieved   13sec   Target Date 11/02/20               PT Long Term Goals - 11/06/20 1606       PT LONG TERM GOAL #2   Title Patient will score 51 or higher on BERG to indicate decreased fall risk.    Baseline 47    Time 8    Period Weeks    Status Achieved   51     PT LONG TERM GOAL #3   Title Patient will complete five time sit to stand in 13s or less to indicate improved LE muscular endurance for improved activity tolerance.    Time 8    Period Weeks    Status Partially Met   13.8s                  Plan - 11/06/20 1616     Clinical Impression Statement Patient demonstrates improved overall balance as BERG score improved from 47 to 51 indicating decreased fall risk. Requiring min A to maintain balance x2 occurences when performing resisted walking activity. Performing five times sit to stand in 13.8s indicating continued functional mobility impairments  related to decreased bil LE muscular endurance. Would benefit from continued skilled  intervention to address impairments for improved functional mobility and activity tolerance.    Personal Factors and Comorbidities Comorbidity 2    Comorbidities history of cancer, fibromyalgia    Examination-Activity Limitations Locomotion Level    Examination-Participation Restrictions Cleaning;Community Activity;Laundry    Rehab Potential Excellent    PT Frequency 1x / week    PT Duration 8 weeks    PT Treatment/Interventions ADLs/Self Care Home Management;Electrical Stimulation;Moist Heat;Gait training;Stair training;Functional mobility training;Therapeutic activities;Therapeutic exercise;Balance training;Neuromuscular re-education;Patient/family education;Taping;Spinal Manipulations;Joint Manipulations    PT Next Visit Plan Continue static and dynamic balance and LE strenthening with focus on hips    PT Home Exercise Plan Access Code [email or web address]    Consulted and Agree with Plan of Care Patient             Patient will benefit from skilled therapeutic intervention in order to improve the following deficits and impairments:  Decreased activity tolerance, Decreased balance, Decreased endurance, Decreased strength, Difficulty walking  Visit Diagnosis: Unsteadiness on feet  Muscle weakness (generalized)     Problem List Patient Active Problem List   Diagnosis Date Noted   Serum sickness due to drug 07/19/2015   Meningioma ([place]) 03/12/2015   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   Abnormal SPEP 02/21/2015   Chronic headache disorder 02/21/2015   Increased urinary frequency 02/21/2015   Chronic fatigue 02/21/2015   Mouth pain 12/02/2014   General weakness 12/02/2014   Dysuria 04/19/2014   Right flank pain 04/19/2014   Urinary frequency 12/10/2013   Essential hypertension 03/19/2013   Preventative health care 12/08/2012   Conjunctivitis, allergic 06/10/2012   Exercise intolerance 02/25/2012   History of  non-Hodgkin's lymphoma 11/18/2011   Fibromyalgia 11/18/2011   Mucous retention cyst of maxillary sinus 11/18/2011   Psoriasis 11/18/2011   Anemia, unspecified 11/18/2011   PUD (peptic ulcer disease) 11/18/2011   Degenerative joint disease 11/18/2011   Lumbar disc disease 11/18/2011   Allergic rhinitis    Depression    Insomnia    Hyperlipidemia    Anxiety    Palpitation-Previously thought to be atrial fibrillation now with Holter data to suggest PVCs     Everardo All PT, DPT  11/06/20 4:23 PM   [facility] [address], [place], [place], [address] Phone: [phone]   Fax:  [phone]  Name: Belinda Mcdowell MRN: [id number] Date of Birth: July 09, 1940

## 2020-11-14 ENCOUNTER — Encounter: Payer: Medicare HMO | Admitting: Physical Therapy

## 2020-11-20 ENCOUNTER — Ambulatory Visit: Payer: Medicare HMO | Admitting: Physical Therapy

## 2020-11-20 ENCOUNTER — Other Ambulatory Visit: Payer: Self-pay

## 2020-11-20 DIAGNOSIS — M6281 Muscle weakness (generalized): Secondary | ICD-10-CM | POA: Diagnosis not present

## 2020-11-20 DIAGNOSIS — R2681 Unsteadiness on feet: Secondary | ICD-10-CM | POA: Diagnosis not present

## 2020-11-20 NOTE — Therapy (Signed)
[facility] [address], [place], [place], [address] Phone: [phone]   Fax:  [phone]  Physical Therapy Treatment  Patient Details  Name: Belinda Mcdowell MRN: [id number] Date of Birth: 1940-09-26 Referring Provider (PT): Dibas Dorthy Cooler, MD   Encounter Date: 11/20/2020  Progress Note Reporting Period 10/05/2020 to 11/20/2020  See note below for Objective Data and Assessment of Progress/Goals.       PT End of Session - 11/20/20 1654     Visit Number 6    Date for PT Re-Evaluation 12/18/20    Authorization Type Medicare    Authorization Time Period KX at visit 15    Progress Note Due on Visit 1    PT Start Time 1445    PT Stop Time 1525    PT Time Calculation (min) 40 min    Activity Tolerance Patient tolerated treatment well    Behavior During Therapy [facility] for tasks assessed/performed             Past Medical History:  Diagnosis Date   Abnormal SPEP 02/21/2015   Allergic rhinitis, cause unspecified    Allergy    states she has environmental allergies; and takes zyrtec   Anemia, unspecified 11/18/2011   Prior on aranesp   Anxiety    Arthritis    Chronic fatigue 02/21/2015   Chronic headache disorder 02/21/2015   Degenerative joint disease 11/18/2011   diffuse   Depression    Fibromyalgia 11/18/2011   Generalized headaches    Hyperlipidemia    Increased urinary frequency 02/21/2015   Insomnia    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 fib   Sinus bradycardia    UTI (lower urinary tract infection)     Past Surgical History:  Procedure Laterality Date    ABDOMINAL HYSTERECTOMY     for dysfunctional bleeding   APPENDECTOMY      There were no vitals filed for this visit.   Subjective Assessment - 11/20/20 1531     Subjective Patient reports that she feels a lot better but would like to continued with therapy to get as good as possible.    Pertinent History history of cancer, fibromyalgia    Limitations Walking;House hold activities    Patient Stated Goals to have more endurance; to have increased bil LE strength    Currently in Pain? No/denies                [facility] PT Assessment - 11/20/20 0001       Assessment   Medical Diagnosis R54 (ICD-10-CM) - Age-related physical debility    Referring Provider (PT) Dibas Koirala, MD    Onset Date/Surgical Date --   middle of last year   Hand Dominance Right    Next MD Visit None    Prior Therapy No      Precautions   Precautions Other (comment)    Precaution Comments history of cancer      Restrictions   Weight Bearing Restrictions No      Balance Screen   Has the patient fallen in the  past 6 months No    Has the patient had a decrease in activity level because of a fear of falling?  Yes    Is the patient reluctant to leave their home because of a fear of falling?  No      Home Environment   Living Environment Private residence    Living Arrangements Alone    Type of [place]      Prior Function   Level of [place] Retired      Associate Professor   Overall Cognitive Status Within Functional Limits for tasks assessed      Posture/Postural Control   Posture/Postural Control Postural limitations    Postural Limitations Forward head;Flexed trunk      Transfers   Five time sit to stand comments  13.2      Balance   Balance Assessed Yes      Standardized Balance Assessment   Standardized Balance Assessment Berg Balance Test      Berg Balance Test   Sit to Stand Able to stand without using hands and stabilize independently    Standing  Unsupported Able to stand safely 2 minutes    Sitting with Back Unsupported but Feet Supported on Floor or Stool Able to sit safely and securely 2 minutes    Stand to Sit Sits safely with minimal use of hands    Transfers Able to transfer safely, minor use of hands    Standing Unsupported with Eyes Closed Able to stand 10 seconds safely    Standing Unsupported with Feet Together Able to place feet together independently and stand 1 minute safely    From Standing, Reach Forward with Outstretched Arm Can reach forward >12 cm safely (5")    From Standing Position, Pick up Object from Floor Able to pick up shoe safely and easily    From Standing Position, Turn to Look Behind Over each Shoulder Looks behind one side only/other side shows less weight shift    Turn 360 Degrees Able to turn 360 degrees safely in 4 seconds or less    Standing Unsupported, Alternately Place Feet on Step/Stool Able to stand independently and safely and complete 8 steps in 20 seconds    Standing Unsupported, One Foot in Front Able to take small step independently and hold 30 seconds    Standing on One Leg Tries to lift leg/unable to hold 3 seconds but remains standing independently    Total Score 49                           [facility] Adult PT Treatment/Exercise - 11/20/20 0001       Neuro Re-ed    Neuro Re-ed Details  dot taps: fwd 45 degrees, lateral, back 45 degrees; hurdle step over: alternating, reciprocal      Knee/Hip Exercises: Standing   Hip Abduction Right;Left;1 set;10 reps    Abduction Limitations blue loop at ankles    Hip Extension Right;Left;1 set;10 reps    Extension Limitations blue loop at ankles    Other Standing Knee Exercises monster walk: blue loop; 2 x 20 feet    Other Standing Knee Exercises suitcase carry: 10# plate, 2x20 feet                    PT Education - 11/20/20 1532     Education Details standing hip abduction, standing hip extension, single limb stance     Person(s)  Educated Patient    Methods Explanation;Demonstration;Tactile cues;Verbal cues;Handout    Comprehension Verbalized understanding;Returned demonstration;Verbal cues required;Tactile cues required              PT Short Term Goals - 11/20/20 1654       PT SHORT TERM GOAL #1   Title Patient will be independent with HEP for continued progression at home.    Time 4    Period Weeks    Status Achieved    Target Date 11/02/20      PT SHORT TERM GOAL #2   Baseline 17s    Time 4    Period Weeks    Status Achieved    Target Date 11/02/20               PT Long Term Goals - 11/20/20 1654       PT LONG TERM GOAL #1   Title Patient will be independent with advanced HEP for long term management of symptoms post D/C.    Time 8    Period Weeks    Status On-going                   Plan - 11/20/20 1533     Clinical Impression Statement Patient is an 80 y/o female referred due to age related physical debility. PMH includes history of cancer and fibromyalgia. She demonstrates improved functional mobility as patient completing five time sit to stand in 13 seconds. Static and dynamic balance improved as patient previously scoring 51 on BERG balance scale. However, this date patient scoring 45 indicating continued moderate fall risk. Verbal and tactile cuing required for decreased trunk lean when performing standing hip abduction and adduction. Would benefit from continued skilled intervention to address impairments for improved mobility and decreased fall risk.    Personal Factors and Comorbidities Comorbidity 2    Comorbidities history of cancer, fibromyalgia    Examination-Activity Limitations Locomotion Level    Examination-Participation Restrictions Cleaning;Community Activity;Laundry    Stability/Clinical Decision Making Stable/Uncomplicated    Clinical Decision Making Low    Rehab Potential Excellent    PT Frequency 1x / week    PT Duration 4 weeks    PT  Treatment/Interventions ADLs/Self Care Home Management;Electrical Stimulation;Moist Heat;Gait training;Stair training;Functional mobility training;Therapeutic activities;Therapeutic exercise;Balance training;Neuromuscular re-education;Patient/family education;Taping;Spinal Manipulations;Joint Manipulations    PT Next Visit Plan Continue static and dynamic balance and LE strenthening with focus on hips    PT Home Exercise Plan Access Code [email or web address]    Consulted and Agree with Plan of Care Patient             Patient will benefit from skilled therapeutic intervention in order to improve the following deficits and impairments:  Decreased activity tolerance, Decreased balance, Decreased endurance, Decreased strength, Difficulty walking  Visit Diagnosis: Unsteadiness on feet - Plan: PT plan of care cert/re-cert  Muscle weakness (generalized) - Plan: PT plan of care cert/re-cert     Problem List Patient Active Problem List   Diagnosis Date Noted   Serum sickness due to drug 07/19/2015   Meningioma ([place]) 03/12/2015   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   Abnormal SPEP 02/21/2015   Chronic headache disorder 02/21/2015   Increased urinary frequency 02/21/2015   Chronic fatigue 02/21/2015   Mouth pain 12/02/2014   General weakness 12/02/2014   Dysuria 04/19/2014   Right flank pain 04/19/2014   Urinary frequency 12/10/2013   Essential hypertension 03/19/2013   Preventative  health care 12/08/2012   Conjunctivitis, allergic 06/10/2012   Exercise intolerance 02/25/2012   History of non-Hodgkin's lymphoma 11/18/2011   Fibromyalgia 11/18/2011   Mucous retention cyst of maxillary sinus 11/18/2011   Psoriasis 11/18/2011   Anemia, unspecified 11/18/2011   PUD (peptic ulcer disease) 11/18/2011   Degenerative joint disease 11/18/2011   Lumbar disc disease 11/18/2011   Allergic rhinitis    Depression    Insomnia     Hyperlipidemia    Anxiety    Palpitation-Previously thought to be atrial fibrillation now with Holter data to suggest PVCs    Everardo All PT, DPT  11/20/20 5:04 PM   [facility] [address], [place], [place], [address] Phone: [phone]   Fax:  [phone]  Name: Belinda Mcdowell MRN: [id number] Date of Birth: 06/18/1940

## 2020-11-20 NOTE — Patient Instructions (Signed)
Standing Hip Abduction with Resistance at Ankles and Counter Support - 1 x daily - 7 x weekly - 2 sets - 10 reps Standing Hip Extension with Resistance at Ankles and Counter Support - 1 x daily - 7 x weekly - 2 sets - 10 reps Single Leg Stance - 1 x daily - 7 x weekly - 1 sets - 3 reps - 5-15s hold

## 2020-11-28 ENCOUNTER — Ambulatory Visit: Payer: Medicare HMO | Admitting: Physical Therapy

## 2020-12-04 ENCOUNTER — Encounter: Payer: Self-pay | Admitting: Physical Therapy

## 2020-12-04 ENCOUNTER — Ambulatory Visit: Payer: Medicare HMO | Attending: Family Medicine | Admitting: Physical Therapy

## 2020-12-04 ENCOUNTER — Other Ambulatory Visit: Payer: Self-pay

## 2020-12-04 DIAGNOSIS — M6281 Muscle weakness (generalized): Secondary | ICD-10-CM | POA: Insufficient documentation

## 2020-12-04 DIAGNOSIS — R2681 Unsteadiness on feet: Secondary | ICD-10-CM | POA: Insufficient documentation

## 2020-12-04 NOTE — Therapy (Signed)
[facility] [address] [place], [place], [place], [address] Phone: [phone]   Fax:  [phone]  Physical Therapy Treatment  Patient Details  Name: Belinda Mcdowell MRN: [id number] Date of Birth: 11/26/1940 Referring Provider (PT): Dibas Dorthy Cooler, MD   Encounter Date: 12/04/2020   PT End of Session - 12/04/20 1540     Visit Number 7    Date for PT Re-Evaluation 12/18/20    Authorization Type Medicare    Authorization Time Period KX at visit 15    Progress Note Due on Visit 70    PT Start Time 1538   pt late   PT Stop Time 1614    PT Time Calculation (min) 36 min    Activity Tolerance Patient tolerated treatment well    Behavior During Therapy [facility] for tasks assessed/performed             Past Medical History:  Diagnosis Date   Abnormal SPEP 02/21/2015   Allergic rhinitis, cause unspecified    Allergy    states she has environmental allergies; and takes zyrtec   Anemia, unspecified 11/18/2011   Prior on aranesp   Anxiety    Arthritis    Chronic fatigue 02/21/2015   Chronic headache disorder 02/21/2015   Degenerative joint disease 11/18/2011   diffuse   Depression    Fibromyalgia 11/18/2011   Generalized headaches    Hyperlipidemia    Increased urinary frequency 02/21/2015   Insomnia    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 fib   Sinus bradycardia    UTI (lower urinary tract infection)     Past Surgical History:  Procedure Laterality Date   ABDOMINAL HYSTERECTOMY     for dysfunctional bleeding   APPENDECTOMY      There were no vitals filed for this visit.    Subjective Assessment - 12/04/20 1541     Subjective Today is pt's birthday. I feel the same as I did last week, no major complaints.    Pertinent History history of cancer, fibromyalgia    Currently in Pain? No/denies    Multiple Pain Sites No                               [facility] Adult PT Treatment/Exercise - 12/04/20 0001       Neuro Re-ed    Neuro Re-ed Details  dot taps: fwd 45 degrees, lateral, back 45 degrees;: alternating, reciprocal      Knee/Hip Exercises: Aerobic   Nustep L2 x 10 min PTA present      Knee/Hip Exercises: Standing   Hip Abduction Right;Left;1 set;10 reps    Abduction Limitations blue loop at ankles   good return demo   Hip Extension Right;Left;1 set;10 reps    Extension Limitations blue loop at ankles   good return demo   Other Standing Knee Exercises monster walk: blue loop; 4 x 20 feet    Other Standing Knee Exercises suitcase carry: 10# plate, 2x80 feet  PT Short Term Goals - 11/20/20 1654       PT SHORT TERM GOAL #1   Title Patient will be independent with HEP for continued progression at home.    Time 4    Period Weeks    Status Achieved    Target Date 11/02/20      PT SHORT TERM GOAL #2   Baseline 17s    Time 4    Period Weeks    Status Achieved    Target Date 11/02/20               PT Long Term Goals - 11/20/20 1654       PT LONG TERM GOAL #1   Title Patient will be independent with advanced HEP for long term management of symptoms post D/C.    Time 8    Period Weeks    Status On-going                   Plan - 12/04/20 1542     Clinical Impression Statement Pt independent and compliant with new HEP given last session. Pt not interested in adding to it today, she is pleased with current workload. Pt gets fatigued easily but recovers 100% with a short rest break.    Personal Factors and Comorbidities Comorbidity 2    Comorbidities history of cancer, fibromyalgia     Examination-Activity Limitations Locomotion Level    Examination-Participation Restrictions Cleaning;Community Activity;Laundry    Stability/Clinical Decision Making Stable/Uncomplicated    Rehab Potential Excellent    PT Frequency 1x / week    PT Duration 4 weeks    PT Treatment/Interventions ADLs/Self Care Home Management;Electrical Stimulation;Moist Heat;Gait training;Stair training;Functional mobility training;Therapeutic activities;Therapeutic exercise;Balance training;Neuromuscular re-education;Patient/family education;Taping;Spinal Manipulations;Joint Manipulations    PT Next Visit Plan Continue static and dynamic balance and LE strenthening with focus on hips    PT Home Exercise Plan Access Code [email or web address]    Consulted and Agree with Plan of Care Patient             Patient will benefit from skilled therapeutic intervention in order to improve the following deficits and impairments:  Decreased activity tolerance, Decreased balance, Decreased endurance, Decreased strength, Difficulty walking  Visit Diagnosis: Unsteadiness on feet  Muscle weakness (generalized)     Problem List Patient Active Problem List   Diagnosis Date Noted   Serum sickness due to drug 07/19/2015   Meningioma ([place]) 03/12/2015   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   Abnormal SPEP 02/21/2015   Chronic headache disorder 02/21/2015   Increased urinary frequency 02/21/2015   Chronic fatigue 02/21/2015   Mouth pain 12/02/2014   General weakness 12/02/2014   Dysuria 04/19/2014   Right flank pain 04/19/2014   Urinary frequency 12/10/2013   Essential hypertension 03/19/2013   Preventative health care 12/08/2012   Conjunctivitis, allergic 06/10/2012   Exercise intolerance 02/25/2012   History of non-Hodgkin's lymphoma 11/18/2011   Fibromyalgia 11/18/2011   Mucous retention cyst of maxillary sinus 11/18/2011   Psoriasis 11/18/2011    Anemia, unspecified 11/18/2011   PUD (peptic ulcer disease) 11/18/2011   Degenerative joint disease 11/18/2011   Lumbar disc disease 11/18/2011   Allergic rhinitis    Depression    Insomnia    Hyperlipidemia    Anxiety    Palpitation-Previously thought to be atrial fibrillation now with Holter data to suggest PVCs     Welford Christmas, PTA 12/04/2020, 4:22 PM  [facility] [address], [place], [place], [address] Phone: [phone]   Fax:  [phone]  Name: Belinda Mcdowell MRN: [id number] Date of Birth: [birth year]

## 2020-12-11 ENCOUNTER — Other Ambulatory Visit: Payer: Self-pay

## 2020-12-11 ENCOUNTER — Ambulatory Visit: Payer: Medicare HMO | Admitting: Physical Therapy

## 2020-12-11 DIAGNOSIS — M6281 Muscle weakness (generalized): Secondary | ICD-10-CM

## 2020-12-11 DIAGNOSIS — R2681 Unsteadiness on feet: Secondary | ICD-10-CM | POA: Diagnosis not present

## 2020-12-11 NOTE — Therapy (Signed)
[facility] [address], [place], [place], [address] Phone: [phone]   Fax:  [phone]  Physical Therapy Treatment  Patient Details  Name: Belinda Mcdowell MRN: [id number] Date of Birth: 03-28-1941 Referring Provider (PT): Dibas Dorthy Cooler, MD   Encounter Date: 12/11/2020   PT End of Session - 12/11/20 1533     Visit Number 8    Date for PT Re-Evaluation 12/18/20    Authorization Type Medicare    Authorization Time Period KX at visit 15    Progress Note Due on Visit 59    PT Start Time 1526    PT Stop Time 1609    PT Time Calculation (min) 43 min    Activity Tolerance Patient tolerated treatment well    Behavior During Therapy [facility] for tasks assessed/performed             Past Medical History:  Diagnosis Date   Abnormal SPEP 02/21/2015   Allergic rhinitis, cause unspecified    Allergy    states she has environmental allergies; and takes zyrtec   Anemia, unspecified 11/18/2011   Prior on aranesp   Anxiety    Arthritis    Chronic fatigue 02/21/2015   Chronic headache disorder 02/21/2015   Degenerative joint disease 11/18/2011   diffuse   Depression    Fibromyalgia 11/18/2011   Generalized headaches    Hyperlipidemia    Increased urinary frequency 02/21/2015   Insomnia    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 fib   Sinus bradycardia    UTI (lower urinary tract infection)     Past Surgical History:  Procedure Laterality Date   ABDOMINAL HYSTERECTOMY     for dysfunctional bleeding   APPENDECTOMY      There were no vitals filed for this visit.   Subjective  Assessment - 12/11/20 1535     Subjective I am glad to have exercises I can do when my back hurts.    Pertinent History history of cancer, fibromyalgia    Patient Stated Goals to have more endurance; to have increased bil LE strength    Currently in Pain? No/denies    Multiple Pain Sites No                               [facility] Adult PT Treatment/Exercise - 12/11/20 0001       Knee/Hip Exercises: Aerobic   Nustep L2 x 10 min PTA present      Knee/Hip Exercises: Standing   Hip Abduction --   Side steps with green loop at calf level 6x, VC to faster speed   Other Standing Knee Exercises suitcase carry: 10# plate, 2x80 feet      Knee/Hip Exercises: Seated   Long Arc Quad Strengthening;Both;1 set;10 reps;Weights    Long Arc Quad Weight 4 lbs.    Sit to Sand 2 sets;10 reps;without UE support   holding 5# KB  PT Short Term Goals - 11/20/20 1654       PT SHORT TERM GOAL #1   Title Patient will be independent with HEP for continued progression at home.    Time 4    Period Weeks    Status Achieved    Target Date 11/02/20      PT SHORT TERM GOAL #2   Baseline 17s    Time 4    Period Weeks    Status Achieved    Target Date 11/02/20               PT Long Term Goals - 11/20/20 1654       PT LONG TERM GOAL #1   Title Patient will be independent with advanced HEP for long term management of symptoms post D/C.    Time 8    Period Weeks    Status On-going                   Plan - 12/11/20 1533     Clinical Impression Statement Pt compliant with program and verbally reports being "happy I have exercises to do when my back hurts me." Requires short breaks only today, otherwise pt tolerates all exercises well. She is considering joining the Copy mat class 1x week.    Personal Factors and Comorbidities Comorbidity 2    Comorbidities history of cancer, fibromyalgia    Examination-Activity  Limitations Locomotion Level    Examination-Participation Restrictions Cleaning;Community Activity;Laundry    Stability/Clinical Decision Making Stable/Uncomplicated    Rehab Potential Excellent    PT Frequency 1x / week    PT Duration 4 weeks    PT Treatment/Interventions ADLs/Self Care Home Management;Electrical Stimulation;Moist Heat;Gait training;Stair training;Functional mobility training;Therapeutic activities;Therapeutic exercise;Balance training;Neuromuscular re-education;Patient/family education;Taping;Spinal Manipulations;Joint Manipulations    PT Next Visit Plan Re-assessment visit next. Consider DC or pt may need new goals if further PT warranted.    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   Abnormal SPEP 02/21/2015   Chronic headache disorder 02/21/2015   Increased urinary frequency 02/21/2015   Chronic fatigue 02/21/2015   Mouth pain 12/02/2014   General weakness 12/02/2014   Dysuria 04/19/2014   Right flank pain 04/19/2014   Urinary frequency 12/10/2013   Essential hypertension 03/19/2013   Preventative health care 12/08/2012   Conjunctivitis, allergic 06/10/2012   Exercise intolerance 02/25/2012   History of non-Hodgkin's lymphoma 11/18/2011   Fibromyalgia 11/18/2011   Mucous retention cyst of maxillary sinus 11/18/2011   Psoriasis 11/18/2011   Anemia,  unspecified 11/18/2011   PUD (peptic ulcer disease) 11/18/2011   Degenerative joint disease 11/18/2011   Lumbar disc disease 11/18/2011   Allergic rhinitis    Depression    Insomnia    Hyperlipidemia    Anxiety    Palpitation-Previously thought to be atrial fibrillation now with Holter data to suggest PVCs  Jester Klingberg, PTA 12/11/2020, 4:11 PM  [facility] [address], [place], [place], [address] Phone: [phone]   Fax:  [phone]  Name: Belinda Mcdowell MRN: [id number] Date of Birth: July 18, 1940

## 2020-12-18 ENCOUNTER — Other Ambulatory Visit: Payer: Self-pay

## 2020-12-18 ENCOUNTER — Ambulatory Visit: Payer: Medicare HMO | Admitting: Physical Therapy

## 2020-12-18 DIAGNOSIS — R2681 Unsteadiness on feet: Secondary | ICD-10-CM | POA: Diagnosis not present

## 2020-12-18 DIAGNOSIS — M6281 Muscle weakness (generalized): Secondary | ICD-10-CM

## 2020-12-18 NOTE — Patient Instructions (Signed)
Standing Shoulder External Rotation with Resistance - 1 x daily - 7 x weekly - 2 sets - 10 reps Seated Shoulder Horizontal Abduction with Resistance - 1 x daily - 7 x weekly - 2 sets - 10 reps

## 2020-12-18 NOTE — Therapy (Signed)
[facility] [address], [place], [place], [address] Phone: [phone]   Fax:  [phone]  Physical Therapy Treatment  Patient Details  Name: Belinda Mcdowell MRN: [id number] Date of Birth: Sep 15, 1940 Referring Provider (PT): Dibas Dorthy Cooler, MD   Encounter Date: 12/18/2020   PT End of Session - 12/18/20 1555     Visit Number 9    Date for PT Re-Evaluation 12/18/20    Authorization Type Medicare    Authorization Time Period KX at visit 15    Progress Note Due on Visit 65    PT Start Time 1534    PT Stop Time 1551    PT Time Calculation (min) 17 min    Activity Tolerance Patient tolerated treatment well    Behavior During Therapy [facility] for tasks assessed/performed             Past Medical History:  Diagnosis Date   Abnormal SPEP 02/21/2015   Allergic rhinitis, cause unspecified    Allergy    states she has environmental allergies; and takes zyrtec   Anemia, unspecified 11/18/2011   Prior on aranesp   Anxiety    Arthritis    Chronic fatigue 02/21/2015   Chronic headache disorder 02/21/2015   Degenerative joint disease 11/18/2011   diffuse   Depression    Fibromyalgia 11/18/2011   Generalized headaches    Hyperlipidemia    Increased urinary frequency 02/21/2015   Insomnia    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 fib   Sinus bradycardia    UTI (lower urinary tract infection)     Past Surgical History:  Procedure Laterality Date   ABDOMINAL HYSTERECTOMY     for dysfunctional bleeding   APPENDECTOMY      There were no vitals filed for this visit.   Subjective  Assessment - 12/18/20 1551     Subjective Feels that she is much improved and has more confidence with daily activities.    Pertinent History history of cancer, fibromyalgia    Limitations Walking;House hold activities    Patient Stated Goals to have more endurance; to have increased bil LE strength    Currently in Pain? No/denies                [facility] PT Assessment - 12/18/20 0001       Assessment   Medical Diagnosis R54 (ICD-10-CM) - Age-related physical debility    Referring Provider (PT) Dibas Koirala, MD    Onset Date/Surgical Date --   middle of last year   Hand Dominance Right    Next MD Visit None    Prior Therapy No      Precautions   Precautions Other (comment)    Precaution Comments history of cancer      Restrictions   Weight Bearing Restrictions No      Balance Screen   Has the patient fallen in the past 6 months No    Has the patient had a decrease in activity level because of a fear of falling?  No    Is the patient reluctant  to leave their home because of a fear of falling?  No      Home Environment   Living Environment Private residence    Living Arrangements Alone    Type of [place]      Prior Function   Level of [place] Retired      Associate Professor   Overall Cognitive Status Within Functional Limits for tasks assessed      Posture/Postural Control   Posture/Postural Control Postural limitations    Postural Limitations Forward head;Flexed trunk      Transfers   Five time sit to stand comments  13.5      Balance   Balance Assessed Yes      Standardized Balance Assessment   Standardized Balance Assessment Berg Balance Test      Berg Balance Test   Sit to Stand Able to stand without using hands and stabilize independently    Standing Unsupported Able to stand safely 2 minutes    Sitting with Back Unsupported but Feet Supported on Floor or Stool Able to sit safely and securely 2 minutes    Stand to Sit Sits  safely with minimal use of hands    Transfers Able to transfer safely, minor use of hands    Standing Unsupported with Eyes Closed Able to stand 10 seconds safely    Standing Unsupported with Feet Together Able to place feet together independently and stand 1 minute safely    From Standing, Reach Forward with Outstretched Arm Can reach confidently >25 cm (10")    From Standing Position, Pick up Object from Floor Able to pick up shoe safely and easily    From Standing Position, Turn to Look Behind Over each Shoulder Looks behind from both sides and weight shifts well    Turn 360 Degrees Able to turn 360 degrees safely in 4 seconds or less    Standing Unsupported, Alternately Place Feet on Step/Stool Able to stand independently and complete 8 steps >20 seconds    Standing Unsupported, One Foot in Front Able to plae foot ahead of the other independently and hold 30 seconds    Standing on One Leg Tries to lift leg/unable to hold 3 seconds but remains standing independently    Total Score 51                           [facility] Adult PT Treatment/Exercise - 12/18/20 0001       Shoulder Exercises: Seated   Horizontal ABduction Both;12 reps;Theraband    Theraband Level (Shoulder Horizontal ABduction) Level 2 (Red)    External Rotation Both;12 reps;Theraband    Theraband Level (Shoulder External Rotation) Level 2 (Red)                    PT Education - 12/18/20 1551     Education Details bilateral shoulder external rotation; bilateral horizontal abduction    Person(s) Educated Patient    Methods Explanation;Demonstration;Tactile cues;Verbal cues;Handout    Comprehension Verbalized understanding;Returned demonstration;Verbal cues required;Tactile cues required              PT Short Term Goals - 11/20/20 1654       PT SHORT TERM GOAL #1   Title Patient will be independent with HEP for continued progression at home.    Time 4    Period Weeks    Status Achieved     Target Date 11/02/20  PT SHORT TERM GOAL #2   Baseline 17s    Time 4    Period Weeks    Status Achieved    Target Date 11/02/20               PT Long Term Goals - 12/18/20 1555       PT LONG TERM GOAL #1   Title Patient will be independent with advanced HEP for long term management of symptoms post D/C.    Time 8    Period Weeks    Status Achieved      PT LONG TERM GOAL #2   Title Patient will score 51 or higher on BERG to indicate decreased fall risk.    Baseline 47    Time 8    Period Weeks    Status Achieved      PT LONG TERM GOAL #3   Title Patient will complete five time sit to stand in 13s or less to indicate improved LE muscular endurance for improved activity tolerance.    Time 8    Period Weeks    Status Achieved                   Plan - 12/18/20 1552     Clinical Impression Statement Patient is an 80 y/o female initially referred due to physical debility. PMH includes history of cancer and fibromyalgia. She demonstrates overall improved functional mobility as patient is able to perform 5x sit to stand in 13.5s which is below age appropriate norms. BERG score improved to 51 indicating decreased fall risk. Patient subjectively reports increased confidence with regards to daily mobility which further indicates decreased fall risk. She has no further apparent skilled needs. Safe to D/C to HEP.    Personal Factors and Comorbidities Comorbidity 2    Comorbidities history of cancer, fibromyalgia    Examination-Activity Limitations Locomotion Level    Examination-Participation Restrictions Cleaning;Community Activity;Laundry    Rehab Potential Excellent    PT Frequency 1x / week    PT Duration 4 weeks    PT Treatment/Interventions ADLs/Self Care Home Management;Electrical Stimulation;Moist Heat;Gait training;Stair training;Functional mobility training;Therapeutic activities;Therapeutic exercise;Balance training;Neuromuscular  re-education;Patient/family education;Taping;Spinal Manipulations;Joint Manipulations    PT Next Visit Plan D/C to HEP    PT Home Exercise Plan Access Code [email or web address]    Consulted and Agree with Plan of Care Patient             Patient will benefit from skilled therapeutic intervention in order to improve the following deficits and impairments:  Decreased activity tolerance, Decreased balance, Decreased endurance, Decreased strength, Difficulty walking  Visit Diagnosis: Unsteadiness on feet  Muscle weakness (generalized)     Problem List Patient Active Problem List   Diagnosis Date Noted   Serum sickness due to drug 07/19/2015   Meningioma ([place]) 03/12/2015   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   Abnormal SPEP 02/21/2015   Chronic headache disorder 02/21/2015   Increased urinary frequency 02/21/2015   Chronic fatigue 02/21/2015   Mouth pain 12/02/2014   General weakness 12/02/2014   Dysuria 04/19/2014   Right flank pain 04/19/2014   Urinary frequency 12/10/2013   Essential hypertension 03/19/2013   Preventative health care 12/08/2012   Conjunctivitis, allergic 06/10/2012   Exercise intolerance 02/25/2012   History of non-Hodgkin's lymphoma 11/18/2011   Fibromyalgia 11/18/2011   Mucous retention cyst of maxillary sinus 11/18/2011   Psoriasis 11/18/2011   Anemia, unspecified 11/18/2011   PUD (  peptic ulcer disease) 11/18/2011   Degenerative joint disease 11/18/2011   Lumbar disc disease 11/18/2011   Allergic rhinitis    Depression    Insomnia    Hyperlipidemia    Anxiety    Palpitation-Previously thought to be atrial fibrillation now with Holter data to suggest PVCs    PHYSICAL THERAPY DISCHARGE SUMMARY  Visits from Start of Care: 9  Current functional level related to goals / functional outcomes: See above   Remaining deficits: See above   Education / Equipment: See above   Patient agrees to  discharge. Patient goals were met. Patient is being discharged due to meeting the stated rehab goals.    Everardo All PT, DPT  12/18/20 3:57 PM   [facility] [address], [place], [place], [address] Phone: [phone]   Fax:  [phone]  Name: Belinda Mcdowell MRN: [id number] Date of Birth: 18-Jun-1940

## 2021-02-13 ENCOUNTER — Other Ambulatory Visit: Payer: Self-pay | Admitting: Family Medicine

## 2021-02-13 DIAGNOSIS — M79662 Pain in left lower leg: Secondary | ICD-10-CM

## 2021-02-13 DIAGNOSIS — M79661 Pain in right lower leg: Secondary | ICD-10-CM

## 2021-02-19 DIAGNOSIS — M25579 Pain in unspecified ankle and joints of unspecified foot: Secondary | ICD-10-CM | POA: Diagnosis not present

## 2021-02-19 DIAGNOSIS — Z79899 Other long term (current) drug therapy: Secondary | ICD-10-CM | POA: Diagnosis not present

## 2021-02-19 DIAGNOSIS — M064 Inflammatory polyarthropathy: Secondary | ICD-10-CM | POA: Diagnosis not present

## 2021-02-19 DIAGNOSIS — R69 Illness, unspecified: Secondary | ICD-10-CM | POA: Diagnosis not present

## 2021-02-19 DIAGNOSIS — T65891A Toxic effect of other specified substances, accidental (unintentional), initial encounter: Secondary | ICD-10-CM | POA: Diagnosis not present

## 2021-02-19 DIAGNOSIS — M79671 Pain in right foot: Secondary | ICD-10-CM | POA: Diagnosis not present

## 2021-02-19 DIAGNOSIS — M797 Fibromyalgia: Secondary | ICD-10-CM | POA: Diagnosis not present

## 2021-02-19 DIAGNOSIS — M549 Dorsalgia, unspecified: Secondary | ICD-10-CM | POA: Diagnosis not present

## 2021-02-19 DIAGNOSIS — R7989 Other specified abnormal findings of blood chemistry: Secondary | ICD-10-CM | POA: Diagnosis not present

## 2021-02-19 DIAGNOSIS — M79672 Pain in left foot: Secondary | ICD-10-CM | POA: Diagnosis not present

## 2021-02-19 DIAGNOSIS — M25571 Pain in right ankle and joints of right foot: Secondary | ICD-10-CM | POA: Diagnosis not present

## 2021-02-19 DIAGNOSIS — M1990 Unspecified osteoarthritis, unspecified site: Secondary | ICD-10-CM | POA: Diagnosis not present

## 2021-02-19 DIAGNOSIS — M25572 Pain in left ankle and joints of left foot: Secondary | ICD-10-CM | POA: Diagnosis not present

## 2021-02-19 DIAGNOSIS — M25551 Pain in right hip: Secondary | ICD-10-CM | POA: Diagnosis not present

## 2021-02-20 ENCOUNTER — Other Ambulatory Visit: Payer: Medicare HMO

## 2021-02-27 ENCOUNTER — Other Ambulatory Visit: Payer: Medicare HMO

## 2021-03-05 ENCOUNTER — Other Ambulatory Visit: Payer: Medicare HMO

## 2021-03-08 ENCOUNTER — Ambulatory Visit
Admission: RE | Admit: 2021-03-08 | Discharge: 2021-03-08 | Disposition: A | Discharge status: Home / Self Care | Payer: Medicare HMO | Source: Ambulatory Visit | Attending: Family Medicine | Admitting: Family Medicine

## 2021-03-08 DIAGNOSIS — M79662 Pain in left lower leg: Secondary | ICD-10-CM

## 2021-03-08 DIAGNOSIS — M79661 Pain in right lower leg: Secondary | ICD-10-CM

## 2021-08-23 DIAGNOSIS — R54 Age-related physical debility: Secondary | ICD-10-CM | POA: Diagnosis not present

## 2021-08-23 DIAGNOSIS — D801 Nonfamilial hypogammaglobulinemia: Secondary | ICD-10-CM | POA: Diagnosis not present

## 2021-08-23 DIAGNOSIS — E7800 Pure hypercholesterolemia, unspecified: Secondary | ICD-10-CM | POA: Diagnosis not present

## 2021-08-23 DIAGNOSIS — I10 Essential (primary) hypertension: Secondary | ICD-10-CM | POA: Diagnosis not present

## 2021-08-23 DIAGNOSIS — E559 Vitamin D deficiency, unspecified: Secondary | ICD-10-CM | POA: Diagnosis not present

## 2021-08-23 DIAGNOSIS — Z0000 Encounter for general adult medical examination without abnormal findings: Secondary | ICD-10-CM | POA: Diagnosis not present

## 2021-08-23 DIAGNOSIS — M519 Unspecified thoracic, thoracolumbar and lumbosacral intervertebral disc disorder: Secondary | ICD-10-CM | POA: Diagnosis not present

## 2021-08-23 DIAGNOSIS — G4700 Insomnia, unspecified: Secondary | ICD-10-CM | POA: Diagnosis not present

## 2021-08-23 DIAGNOSIS — Z8572 Personal history of non-Hodgkin lymphomas: Secondary | ICD-10-CM | POA: Diagnosis not present

## 2021-08-23 DIAGNOSIS — Z79899 Other long term (current) drug therapy: Secondary | ICD-10-CM | POA: Diagnosis not present

## 2021-08-23 DIAGNOSIS — R69 Illness, unspecified: Secondary | ICD-10-CM | POA: Diagnosis not present

## 2021-09-11 NOTE — Therapy (Incomplete)
?OUTPATIENT PHYSICAL THERAPY LOWER EXTREMITY EVALUATION ? ? ?Patient Name: Belinda Mcdowell ?MRN: [id number] ?DOB:10/22/1940, 81 y.o., female ?Today's Date: 09/11/2021 ? ? ? ?Past Medical History:  ?Diagnosis Date  ? Abnormal SPEP 02/21/2015  ? Allergic rhinitis, cause unspecified   ? Allergy   ? states she has environmental allergies; and takes zyrtec  ? Anemia, unspecified 11/18/2011  ? Prior on aranesp  ? Anxiety   ? Arthritis   ? Chronic fatigue 02/21/2015  ? 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 Degenerative joint disease 11/18/2011  ? diffuse  ? Depression   ? Fibromyalgia 11/18/2011  ? Generalized headaches   ? Hyperlipidemia   ? Increased urinary frequency 02/21/2015  ? Insomnia   ? 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 Lumbar disc disease 11/18/2011  ? Mucous retention cyst of maxillary sinus 11/18/2011  ? Right   ? Non-Hodgkin lymphoma ([place]) 11/18/2011  ? Stage 3  ? Psoriasis 11/18/2011  ? On MTX  ? PUD (peptic ulcer disease) 11/18/2011  ? Duodenal at age 8 and 37  ? PVC's (premature ventricular contractions)   ? by holter  previously thought to be A. fib  ? Sinus bradycardia   ? UTI (lower urinary tract infection)   ? ?Past Surgical History:  ?Procedure Laterality Date  ? ABDOMINAL HYSTERECTOMY    ? for dysfunctional bleeding  ? APPENDECTOMY    ? ?Patient Active Problem List  ? Diagnosis Date Noted  ? Serum sickness due to drug 07/19/2015  ? Meningioma ([place]) 03/12/2015  ? 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 Abnormal SPEP 02/21/2015  ? 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 Increased urinary frequency 02/21/2015  ? Chronic fatigue 02/21/2015  ? Mouth pain 12/02/2014  ? General weakness 12/02/2014  ? Dysuria 04/19/2014  ? Right flank pain 04/19/2014  ? Urinary frequency 12/10/2013  ? Essential hypertension 03/19/2013  ? Preventative health care 12/08/2012  ? Conjunctivitis, allergic 06/10/2012  ? Exercise intolerance 02/25/2012  ? History of  non-Hodgkin's lymphoma 11/18/2011  ? Fibromyalgia 11/18/2011  ? Mucous retention cyst of maxillary sinus 11/18/2011  ? Psoriasis 11/18/2011  ? Anemia, unspecified 11/18/2011  ? PUD (peptic ulcer disease) 11/18/2011  ? Degenerative joint disease 11/18/2011  ? Lumbar disc disease 11/18/2011  ? Allergic rhinitis   ? Depression   ? Insomnia   ? Hyperlipidemia   ? Anxiety   ? Palpitation-Previously thought to be atrial fibrillation now with Holter data to suggest PVCs   ? ? ?PCP: Lujean Amel, MD ? ?REFERRING PROVIDER: Lujean Amel, MD ? ?REFERRING DIAG: physical deconditioning, weakness, frailty ? ?THERAPY DIAG:  ?No diagnosis found. ? ?ONSET DATE: *** ? ?SUBJECTIVE:  ? ?SUBJECTIVE STATEMENT: ?*** ? ?PERTINENT HISTORY: ?Diffuse arthritis, fibromyalgia, lymphoma (2013), depression  ? ?PAIN:  ?Are you having pain? {OPRCPAIN:27236} ? ?PRECAUTIONS: {Therapy precautions:24002} ? ?WEIGHT BEARING RESTRICTIONS No ? ?FALLS:  ?Has patient fallen in last 6 months? {fallsyesno:27318} ? ?LIVING ENVIRONMENT: ?Lives with: {[facility] lives with:25569::"lives with their family"} ?Lives in: {Lives in:25570} ?Stairs: {opstairs:27293} ?Has following equipment at home: {Assistive devices:23999} ? ?OCCUPATION: *** ? ?PLOF: {PLOF:24004} ? ?PATIENT GOALS *** ? ? ?OBJECTIVE:  ? ?DIAGNOSTIC FINDINGS: *** ? ? ? ? ?COGNITION: ? Overall cognitive status: {cognition:24006}   ?  ?SENSATION: ?{sensation:27233} ? ?MUSCLE LENGTH: ?Hamstrings: Right *** deg; Left *** deg ?Thomas test: Right *** deg; Left *** deg ? ?POSTURE:  ?*** ? ?PALPATION: ?*** ? ?LE ROM: ? ?{AROM/PROM:27142} ROM Right ?09/11/2021  Left ?09/11/2021  ?Hip flexion    ?Hip extension    ?Hip abduction    ?Hip adduction    ?Hip internal rotation    ?Hip external rotation    ?Knee flexion    ?Knee extension    ?Ankle dorsiflexion    ?Ankle plantarflexion    ?Ankle inversion    ?Ankle eversion    ? (Blank rows = not tested) ? ?LE MMT: ? ?MMT Right ?09/11/2021 Left ?09/11/2021  ?Hip flexion     ?Hip extension    ?Hip abduction    ?Hip adduction    ?Hip internal rotation    ?Hip external rotation    ?Knee flexion    ?Knee extension    ?Ankle dorsiflexion    ?Ankle plantarflexion    ?Ankle inversion    ?Ankle eversion    ? (Blank rows = not tested) ? ?LOWER EXTREMITY SPECIAL TESTS:  ?{LEspecialtests:26242} ? ?FUNCTIONAL TESTS:  ?{Functional tests:24029} ? ?GAIT: ?Distance walked: *** ?Assistive device utilized: {Assistive devices:23999} ?Level of assistance: {Levels of assistance:24026} ?Comments: *** ? ? ? ?TODAY'S TREATMENT: ?Treatment on date:  ? ? ? ?  ? ? ?PATIENT EDUCATION:  ?Education details: *** ?Person educated: Patient ?Education method: Explanation, Demonstration, and Handouts ?Education comprehension: verbalized understanding and returned demonstration ? ? ?HOME EXERCISE PROGRAM: ?*** ? ?ASSESSMENT: ? ?CLINICAL IMPRESSION: ?Patient is a 81 y.o. female who was seen today for physical therapy evaluation and treatment for weakness, balance and gait.  ? ? ?OBJECTIVE IMPAIRMENTS Abnormal gait, decreased balance, decreased endurance, decreased mobility, difficulty walking, decreased strength, and decreased safety awareness.  ? ?ACTIVITY LIMITATIONS cleaning, community activity, and laundry.  ? ?PERSONAL FACTORS Age and 1-2 comorbidities: fibromyalgia, widespread DJD  are also affecting patient's functional outcome.  ? ? ?REHAB POTENTIAL: Good ? ?CLINICAL DECISION MAKING: {clinical decision making:25114} ? ?EVALUATION COMPLEXITY: {Evaluation complexity:25115} ? ? ?GOALS: ?Goals reviewed with patient? Yes ? ?/SHORT TERM GOALS: Target date: 10/10/21 ? ?Be independent in initial HEP ?Baseline: ?Goal status: INITIAL ? ?2.  *** ?Baseline:  ?Goal status: {GOALSTATUS:25110} ? ?3.  *** ?Baseline:  ?Goal status: {GOALSTATUS:25110} ? ?4.  *** ?Baseline:  ?Goal status: {GOALSTATUS:25110} ? ? ?LONG TERM GOALS: Target date: 11/07/21 ? ?Be independent in advanced HEP ?Baseline:  ?Goal status: INITIAL ? ?2.   *** ?Baseline:  ?Goal status: {GOALSTATUS:25110} ? ?3.  *** ?Baseline:  ?Goal status: {GOALSTATUS:25110} ? ?4.  *** ?Baseline:  ?Goal status: {GOALSTATUS:25110} ? ?5.  *** ?Baseline:  ?Goal status: {GOALSTATUS:25110} ? ?6.  *** ?Baseline:  ?Goal status: {GOALSTATUS:25110} ? ? ?PLAN: ?PT FREQUENCY: 2x/week ? ?PT DURATION: 8 weeks ? ?PLANNED INTERVENTIONS: Therapeutic exercises, Therapeutic activity, Neuromuscular re-education, Balance training, Gait training, Patient/Family education, Joint mobilization, Stair training, Aquatic Therapy, Cryotherapy, Moist heat, and Manual therapy ? ?PLAN FOR NEXT SESSION: review HEP, strength, mobility, endurance  ? ? ?Shyane Fossum, PT ?09/11/2021, 4:12 PM  ?

## 2021-09-12 ENCOUNTER — Ambulatory Visit: Payer: Medicare HMO

## 2021-09-18 DIAGNOSIS — E039 Hypothyroidism, unspecified: Secondary | ICD-10-CM | POA: Diagnosis not present

## 2021-09-20 ENCOUNTER — Ambulatory Visit: Payer: Medicare HMO | Attending: Family Medicine | Admitting: Physical Therapy

## 2021-09-20 ENCOUNTER — Encounter: Payer: Self-pay | Admitting: Physical Therapy

## 2021-09-20 DIAGNOSIS — R5381 Other malaise: Secondary | ICD-10-CM | POA: Insufficient documentation

## 2021-09-20 DIAGNOSIS — M6281 Muscle weakness (generalized): Secondary | ICD-10-CM | POA: Insufficient documentation

## 2021-09-20 DIAGNOSIS — R2681 Unsteadiness on feet: Secondary | ICD-10-CM | POA: Insufficient documentation

## 2021-09-20 NOTE — Therapy (Signed)
?OUTPATIENT PHYSICAL THERAPY NEURO EVALUATION ? ? ?Patient Name: Belinda Mcdowell ?MRN: [id number] ?DOB:13-Feb-1941, 81 y.o., female ?Today's Date: 09/20/2021 ? ?PCP: Dr. Lauretta Grill K[place] ?REFERRING PROVIDER: Dr. Lujean Amel ? ? PT End of Session - 09/20/21 1537   ? ? Visit Number 1   ? Date for PT Re-Evaluation 11/15/21   ? Authorization Type [employer]et[employer]   ? PT Start Time 1533   ? PT Stop Time 5053   ? PT Time Calculation (min) 40 min   ? Activity Tolerance Patient tolerated treatment well   ? ?  ?  ? ?  ? ? ?Past Medical History:  ?Diagnosis Date  ? Abnormal SPEP 02/21/2015  ? Allergic rhinitis, cause unspecified   ? Allergy   ? states she has environmental allergies; and takes zyrtec  ? Anemia, unspecified 11/18/2011  ? Prior on aranesp  ? Anxiety   ? Arthritis   ? Chronic fatigue 02/21/2015  ? 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 Degenerative joint disease 11/18/2011  ? diffuse  ? Depression   ? Fibromyalgia 11/18/2011  ? Generalized headaches   ? Hyperlipidemia   ? Increased urinary frequency 02/21/2015  ? Insomnia   ? 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 Lumbar disc disease 11/18/2011  ? Mucous retention cyst of maxillary sinus 11/18/2011  ? Right   ? Non-Hodgkin lymphoma ([place]) 11/18/2011  ? Stage 3  ? Psoriasis 11/18/2011  ? On MTX  ? PUD (peptic ulcer disease) 11/18/2011  ? Duodenal at age 4 and 60  ? PVC's (premature ventricular contractions)   ? by holter  previously thought to be A. fib  ? Sinus bradycardia   ? UTI (lower urinary tract infection)   ? ?Past Surgical History:  ?Procedure Laterality Date  ? ABDOMINAL HYSTERECTOMY    ? for dysfunctional bleeding  ? APPENDECTOMY    ? ?Patient Active Problem List  ? Diagnosis Date Noted  ? Serum sickness due to drug 07/19/2015  ? Meningioma ([place]) 03/12/2015  ? 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 Abnormal SPEP 02/21/2015  ? 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 Increased urinary frequency 02/21/2015   ? Chronic fatigue 02/21/2015  ? Mouth pain 12/02/2014  ? General weakness 12/02/2014  ? Dysuria 04/19/2014  ? Right flank pain 04/19/2014  ? Urinary frequency 12/10/2013  ? Essential hypertension 03/19/2013  ? Preventative health care 12/08/2012  ? Conjunctivitis, allergic 06/10/2012  ? Exercise intolerance 02/25/2012  ? History of non-Hodgkin's lymphoma 11/18/2011  ? Fibromyalgia 11/18/2011  ? Mucous retention cyst of maxillary sinus 11/18/2011  ? Psoriasis 11/18/2011  ? Anemia, unspecified 11/18/2011  ? PUD (peptic ulcer disease) 11/18/2011  ? Degenerative joint disease 11/18/2011  ? Lumbar disc disease 11/18/2011  ? Allergic rhinitis   ? Depression   ? Insomnia   ? Hyperlipidemia   ? Anxiety   ? Palpitation-Previously thought to be atrial fibrillation now with Holter data to suggest PVCs   ? ? ?ONSET DATE: 05/27/21 ? ?REFERRING DIAG: deconditioning; weakness ? ?THERAPY DIAG:  ?Generalized weakness ?SUBJECTIVE:  ?                                                                                                                                                                                           ? ?  SUBJECTIVE STATEMENT: ?Had PT 1 year ago.  My hip flexors are non existent.  Lives in 1 bedroom but walking from 1 room to kitchen I can hardly make it.  Pain in shoulders and neck.  I've been sick this year--3 bad episodes lasting 2-3 months with lots of coughing and that left me weak.  Uses cane often but not using today.  Used to go to [employer].  Have done yoga for years.  I have severe fibromyalgia. Daughter helps when needed.   ? ? ?PERTINENT HISTORY: fibromyalgia; history of cancer ? ?PAIN:  ?Are you having pain? PAIN:  ?Are you having pain? Yes ?NPRS scale: 10/10 ?Pain location: back/buttocks, shoulders/neck the pain never goes away ? ?Pain description: sharp and stabbing  ?Aggravating factors: mornings ?Relieving factors: ibuprofen 3 every mornings;sitting ? ?PRECAUTIONS: Other: fibromyalgia  ? ?WEIGHT BEARING  RESTRICTIONS No ? ?FALLS: Has patient fallen in last 6 months? No; I don't feel secure though. I have to be careful. Many close-calls. ? ?LIVING ENVIRONMENT: ?Lives with: lives alone ?Lives in: House/apartment ?No stairs ?Has following equipment at home: Single point cane ? ?PLOF: Independent with household mobility with device;  2 cats ? ?PATIENT GOALS be able to function better from the waist down; go shopping [employer]  ?LEISURE:  ? ?OBJECTIVE:  ? ?DIAGNOSTIC FINDINGS: none ? ?COGNITION: ?Overall cognitive status: Within functional limits for tasks assessed ?  ? ?POSTURE: rounded shoulders and forward head ? ?LE ROM:   WFLs ? ?MMT:  LEs grossly 4/5 except hip abductors 4-/5 bil;  able to rise from chair without UE assist ? ? ? ? ?Comments: Loss of balance with putting on rain coat, min assist to stabilize ? ?FUNCTIONAL TESTs: no cane ?5 times sit to stand: 16 ?Timed up and go (TUG): to be done next visit  ?2 minute walk test: 3 min walk test 12/20 Perceived exertion 500 feet ?Berg Balance Scale: 46/56 ? ? ? ?TODAY'S TREATMENT:  ?Treatment plan ? ? ?PATIENT EDUCATION: ?Education details: treatment plan ?Person educated: Patient ?Education method: Explanation ?Education comprehension: verbalized understanding ? ? ?HOME EXERCISE PROGRAM: ?To be started next visit ? ? ? ?GOALS: ?Goals reviewed with patient? Yes ? ?SHORT TERM GOALS: Target date: 10/18/2021 ? ?The patient will demonstrate knowledge of basic self care strategies and exercises for improved strength and mobility ?Baseline: ?Goal status: INITIAL ? ?2.  The patient will have an improved BERG balance score to  49  /56 indicating reduced risk of falls  ?Baseline:  ?Goal status: INITIAL ? ?3.  5x sit to stand test to 15.5 sec indicating improved LE strength  ?Baseline:  ?Goal status: INITIAL ? ?4.  Able to ambulate 600 feet in 4 min  ?Baseline:  ?Goal status: INITIAL ? ? ? ?LONG TERM GOALS: Target date: 11/15/2021 ? ?The patient will be independent with a  safe self progressive HEP needed for long term strengthening needed for home and community mobility ?Baseline:  ?Goal status: INITIAL ? ?2.  BERG score improved to 51/56 indicating decreased fall risk ?Baseline:  ?Goal status: INITIAL ? ?3.  Patient will complete 5x sit to stand in  14 sec less indicating improved LE muscular endurance for activity tolerance  ?Baseline:  ?Goal status: INITIAL ? ?4.  Gait 700 feet in 6 minutes with perceived exertion of 12/20 ?Baseline:  ?Goal status: INITIAL ? ?5.  The patient will have improved LE strength to at least 4+/5 needed for standing, walking longer distances and negotiating curbs at home and  in the community  ?Baseline:  ?Goal status: INITIAL ? ? ?ASSESSMENT: ? ?CLINICAL IMPRESSION: ?Patient is a 81  y.o. female who was seen today for physical therapy evaluation and treatment for generalized weakness.  The patient had PT last year and reports a decline in status since that time.  Decreased BERG score to 45/56 (51/56 last year) and decreased sit to stand test as well at 16 sec (13.5 sec last year).  Limited gait tolerance as well.  Fibromyalgia/chronic pain with multi regions affected.   She would benefit in PT for strengthening and balance ex's.   ? ? ?OBJECTIVE IMPAIRMENTS decreased activity tolerance, decreased endurance, difficulty walking, decreased strength, and impaired perceived functional ability.  ? ?ACTIVITY LIMITATIONS cleaning, meal prep, laundry, and shopping.  ? ?PERSONAL FACTORS Age and 1 comorbidity: fibromyalgia  are also affecting patient's functional outcome.  ? ? ?REHAB POTENTIAL: Good ? ?CLINICAL DECISION MAKING: Stable/uncomplicated ? ?EVALUATION COMPLEXITY: Low ? ?PLAN: ?PT FREQUENCY: 2x/week ? ?PT DURATION: 8 weeks ? ?PLANNED INTERVENTIONS: Therapeutic exercises, Therapeutic activity, Neuromuscular re-education, Balance training, Gait training, Patient/Family education, Joint mobilization, Aquatic Therapy, Moist heat, Taping, and Manual  therapy ? ?PLAN FOR NEXT SESSION: dual task balance ex's (putting on jacket), single leg/narrow base of support; Nu-Step;  general LE strengthening (low intensity secondary to fibro) ? ?Ruben Im, PT ?09/20/21 5:

## 2021-09-26 ENCOUNTER — Ambulatory Visit: Payer: Medicare HMO | Attending: Family Medicine

## 2021-09-26 DIAGNOSIS — M6281 Muscle weakness (generalized): Secondary | ICD-10-CM | POA: Diagnosis not present

## 2021-09-26 DIAGNOSIS — R2681 Unsteadiness on feet: Secondary | ICD-10-CM | POA: Insufficient documentation

## 2021-09-26 NOTE — Therapy (Signed)
?OUTPATIENT PHYSICAL THERAPY TREATMENT NOTE ? ? ?Patient Name: Belinda Mcdowell ?MRN: [id number] ?DOB:07/18/1940, 81 y.o., female ?Today's Date: 09/26/2021 ? ?PCP: Dr. Lauretta Grill K[place]ala ?REFERRING PROVIDER: Dr. Lujean Amel ? ?END OF SESSION:  ? PT End of Session - 09/26/21 1700   ? ? Visit Number 2   ? Date for PT Re-Evaluation 11/15/21   ? Authorization Type Aet[employer]   ? Progress Note Due on Visit 10   ? PT Start Time 1616   ? PT Stop Time 1657   ? PT Time Calculation (min) 41 min   ? Activity Tolerance Patient tolerated treatment well   ? Behavior During Therapy [facility] for tasks assessed/performed   ? ?  ?  ? ?  ? ? ?Past Medical History:  ?Diagnosis Date  ? Abnormal SPEP 02/21/2015  ? Allergic rhinitis, cause unspecified   ? Allergy   ? states she has environmental allergies; and takes zyrtec  ? Anemia, unspecified 11/18/2011  ? Prior on aranesp  ? Anxiety   ? Arthritis   ? Chronic fatigue 02/21/2015  ? 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 Degenerative joint disease 11/18/2011  ? diffuse  ? Depression   ? Fibromyalgia 11/18/2011  ? Generalized headaches   ? Hyperlipidemia   ? Increased urinary frequency 02/21/2015  ? Insomnia   ? 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 Lumbar disc disease 11/18/2011  ? Mucous retention cyst of maxillary sinus 11/18/2011  ? Right   ? Non-Hodgkin lymphoma ([place]) 11/18/2011  ? Stage 3  ? Psoriasis 11/18/2011  ? On MTX  ? PUD (peptic ulcer disease) 11/18/2011  ? Duodenal at age 76 and 29  ? PVC's (premature ventricular contractions)   ? by holter  previously thought to be A. fib  ? Sinus bradycardia   ? UTI (lower urinary tract infection)   ? ?Past Surgical History:  ?Procedure Laterality Date  ? ABDOMINAL HYSTERECTOMY    ? for dysfunctional bleeding  ? APPENDECTOMY    ? ?Patient Active Problem List  ? Diagnosis Date Noted  ? Serum sickness due to drug 07/19/2015  ? Meningioma ([place]) 03/12/2015  ? 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 Abnormal SPEP 02/21/2015  ? 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 Increased urinary frequency 02/21/2015  ? Chronic fatigue 02/21/2015  ? Mouth pain 12/02/2014  ? General weakness 12/02/2014  ? Dysuria 04/19/2014  ? Right flank pain 04/19/2014  ? Urinary frequency 12/10/2013  ? Essential hypertension 03/19/2013  ? Preventative health care 12/08/2012  ? Conjunctivitis, allergic 06/10/2012  ? Exercise intolerance 02/25/2012  ? History of non-Hodgkin's lymphoma 11/18/2011  ? Fibromyalgia 11/18/2011  ? Mucous retention cyst of maxillary sinus 11/18/2011  ? Psoriasis 11/18/2011  ? Anemia, unspecified 11/18/2011  ? PUD (peptic ulcer disease) 11/18/2011  ? Degenerative joint disease 11/18/2011  ? Lumbar disc disease 11/18/2011  ? Allergic rhinitis   ? Depression   ? Insomnia   ? Hyperlipidemia   ? Anxiety   ? Palpitation-Previously thought to be atrial fibrillation now with Holter data to suggest PVCs   ? ? ?REFERRING DIAG: deconditioning; weakness ? ?THERAPY DIAG:  ?Muscle weakness (generalized) ? ?Unsteadiness on feet ? ?PERTINENT HISTORY: fibromyalgia; history of cancer ? ?PRECAUTIONS: fibromyalgia  ? ?SUBJECTIVE: I've been doing the exercises at home that the therapist gave me last time.  ? ?PAIN:  ?Are you having pain? Yes: NPRS scale: unbearable/10 ?Pain location: legs  ?Pain description: sore,  stiff, unbearable  ?Aggravating factors: in the mornings, with activities  ?Relieving factors: Ibuprofen ? ? ?OBJECTIVE: (objective measures completed at initial evaluation unless otherwise dated)  ?09/20/21 ?DIAGNOSTIC FINDINGS: none ?  ?COGNITION: ?Overall cognitive status: Within functional limits for tasks assessed ?            ?  ?POSTURE: rounded shoulders and forward head ?  ?LE ROM:   WFLs ?  ?MMT:  LEs grossly 4/5 except hip abductors 4-/5 bil;  able to rise from chair without UE assist ?  ?  ?  ?  ?Comments: Loss of balance with putting on rain coat, min assist to stabilize ?  ?FUNCTIONAL TESTs: no cane ?5 times  sit to stand: 16 ?Timed up and go (TUG): to be done next visit  ?2 minute walk test: 3 min walk test 12/20 Perceived exertion 500 feet ?Berg Balance Scale: 46/56 ?  ?  ?  ?TODAY'S TREATMENT:  ?Treatment on date: 09/26/21 ?NuStep: Level 1x 8 minutes- PT present to discuss progress.  ?Sit to stand: 2x10 ?Tandem stance: 3x 20 seconds-min UE support  ?Hip abduction and extension: 2# added x10 bil each- moderate UE support ? Seated march: 2# added 2x10 ?Standing rockerboard x1 min ?Balance:  ?Box stepping x 8 laps each way with min UE support as needed ?Alternating tap on low conex 20 taps   ?SLS: 15 seconds x 2 Rt and Lt ?Mini tramp: weight shifting 3 ways x 1 min each ?  ?  ?PATIENT EDUCATION: ?Education details: Access Code: [email or web address] ?Person educated: Patient ?Education method: Explanation, demo ?Education comprehension: verbalized understanding, return demo ?  ?  ?HOME EXERCISE PROGRAM: ?From prior session of PT last year:  ?Access Code: [email or web address] ?URL: [email or web address] ?Date: 09/26/2021 ?Prepared by: Claiborne Billings ? ?Exercises ?- Standing Terminal Knee Extension at Wall with Ball  - 1 x daily - 7 x weekly - 2 sets - 12 reps ?- Sit to Stand Without Arm Support  - 1 x daily - 7 x weekly - 2 sets - 10 reps ?- Tandem Stance in Corner  - 1 x daily - 7 x weekly - 1 sets - 2-3 reps - 30s hold ?- Supine Bridge  - 1 x daily - 7 x weekly - 2 sets - 12 reps ?- Standing Hip Abduction with Resistance at Ankles and Counter Support  - 1 x daily - 7 x weekly - 2 sets - 10 reps ?- Standing Hip Extension with Resistance at Ankles and Counter Support  - 1 x daily - 7 x weekly - 2 sets - 10 reps ?- Single Leg Stance  - 1 x daily - 7 x weekly - 1 sets - 3 reps - 5-15s hold ?- Standing Shoulder External Rotation with Resistance  - 1 x daily - 7 x weekly - 2 sets - 10 reps ?- Seated Shoulder Horizontal Abduction with Resistance  - 1 x daily - 7 x weekly - 2 sets - 10 reps ?  ?  ?  ?GOALS: ?Goals reviewed with patient? Yes ?   ?SHORT TERM GOALS: Target date: 10/18/2021 ?  ?The patient will demonstrate knowledge of basic self care strategies and exercises for improved strength and mobility ?Baseline: ?Goal status: INITIAL ?  ?2.  The patient will have an improved BERG balance score to  49  /56 indicating reduced risk of falls  ?Baseline:  ?Goal status: INITIAL ?  ?3.  5x sit to stand test to 15.5 sec indicating improved LE strength  ?Baseline:  ?  Goal status: INITIAL ?  ?4.  Able to ambulate 600 feet in 4 min  ?Baseline:  ?Goal status: INITIAL ?  ?  ?  ?LONG TERM GOALS: Target date: 11/15/2021 ?  ?The patient will be independent with a safe self progressive HEP needed for long term strengthening needed for home and community mobility ?Baseline:  ?Goal status: INITIAL ?  ?2.  BERG score improved to 51/56 indicating decreased fall risk ?Baseline:  ?Goal status: INITIAL ?  ?3.  Patient will complete 5x sit to stand in  14 sec less indicating improved LE muscular endurance for activity tolerance  ?Baseline:  ?Goal status: INITIAL ?  ?4.  Gait 700 feet in 6 minutes with perceived exertion of 12/20 ?Baseline:  ?Goal status: INITIAL ?  ?5.  The patient will have improved LE strength to at least 4+/5 needed for standing, walking longer distances and negotiating curbs at home and in the community  ?Baseline:  ?Goal status: INITIAL ?  ?  ?ASSESSMENT: ?  ?CLINICAL IMPRESSION: ?First time follow-up after evaluation.  Pt reports that she is doing the exercises issued at this clinic 1 year ago.  Session spent reviewing each exercise and working on strength, balance and endurance.  PT provided close supervision and guarding for all activity. Pt required moderate verbal cues for alignment and speed of movement.  Pt was fatigued with exercise and required rest breaks between sets.    She would benefit in PT for strengthening and balance ex's.   ?  ?  ?OBJECTIVE IMPAIRMENTS decreased activity tolerance, decreased endurance, difficulty walking, decreased  strength, and impaired perceived functional ability.  ?  ?ACTIVITY LIMITATIONS cleaning, meal prep, laundry, and shopping.  ?  ?PERSONAL FACTORS Age and 1 comorbidity: fibromyalgia  are also affecting patient's f

## 2021-10-01 ENCOUNTER — Encounter: Payer: Medicare HMO | Admitting: Physical Therapy

## 2021-10-01 NOTE — Therapy (Deleted)
OUTPATIENT PHYSICAL THERAPY TREATMENT NOTE   Patient Name: Belinda Mcdowell MRN: [id number] DOB:Jul 02, 1940, 81 y.o., female Today's Date: 10/01/2021  PCP: Dr. Lujean Amel REFERRING PROVIDER: Dr. 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   Abnormal SPEP 02/21/2015   Chronic headache disorder 02/21/2015   Increased urinary frequency 02/21/2015   Chronic fatigue 02/21/2015   Mouth pain 12/02/2014   General weakness 12/02/2014   Dysuria 04/19/2014   Right flank pain 04/19/2014   Urinary frequency 12/10/2013   Essential hypertension 03/19/2013   Preventative health care 12/08/2012    Conjunctivitis, allergic 06/10/2012   Exercise intolerance 02/25/2012   History of non-Hodgkin's lymphoma 11/18/2011   Fibromyalgia 11/18/2011   Mucous retention cyst of maxillary sinus 11/18/2011   Psoriasis 11/18/2011   Anemia, unspecified 11/18/2011   PUD (peptic ulcer disease) 11/18/2011   Degenerative joint disease 11/18/2011   Lumbar disc disease 11/18/2011   Allergic rhinitis    Depression    Insomnia    Hyperlipidemia    Anxiety    Palpitation-Previously thought to be atrial fibrillation now with Holter data to suggest PVCs     REFERRING DIAG: deconditioning; weakness  THERAPY DIAG:  Muscle weakness (generalized)  Unsteadiness on feet  PERTINENT HISTORY: fibromyalgia; history of cancer  PRECAUTIONS: fibromyalgia   SUBJECTIVE: I've been doing the exercises at home that the therapist gave me last time.   PAIN:  Are you having pain? Yes: NPRS scale: unbearable/10 Pain location: legs  Pain description: sore, stiff, unbearable  Aggravating factors: in the mornings, with activities  Relieving factors: Ibuprofen   OBJECTIVE: (objective measures completed at initial evaluation unless otherwise dated)  09/20/21 DIAGNOSTIC FINDINGS: none   COGNITION: Overall cognitive status: Within functional limits for tasks assessed               POSTURE: rounded shoulders and forward head   LE ROM:   WFLs   MMT:  LEs grossly 4/5 except hip abductors 4-/5 bil;  able to rise from chair without  UE assist         Comments: Loss of balance with putting on rain coat, min assist to stabilize   FUNCTIONAL TESTs: no cane 5 times sit to stand: 16 Timed up and go (TUG): to be done next visit  2 minute walk test: 3 min walk test 12/20 Perceived exertion 500 feet Berg Balance Scale: 46/56       TODAY'S TREATMENT:   10/01/21: NuStep: Level 1x 8 minutes- PT present to discuss progress.  Sit to stand: 2x10 Tandem stance: 3x 20 seconds-min UE support  Hip abduction and extension:  2# added x10 bil each- moderate UE support  Seated march: 2# added 2x10 Standing rockerboard x1 min Balance:  Box stepping x 8 laps each way with min UE support as needed Alternating tap on low conex 20 tapSLS: 15 seconds x 2 Rt and Lt Mini tramp: weight shifting 3 ways x 1 min each   Treatment on date: 09/26/21 NuStep: Level 1x 8 minutes- PT present to discuss progress.  Sit to stand: 2x10 Tandem stance: 3x 20 seconds-min UE support  Hip abduction and extension: 2# added x10 bil each- moderate UE support  Seated march: 2# added 2x10 Standing rockerboard x1 min Balance:  Box stepping x 8 laps each way with min UE support as needed Alternating tap on low conex 20 taps   SLS: 15 seconds x 2 Rt and Lt Mini tramp: weight shifting 3 ways x 1 min each     PATIENT EDUCATION: Education details: Access Code: [email or web address] Person educated: Patient Education method: Explanation, demo Education comprehension: verbalized understanding, return demo     [place]: From prior session of PT last year:  Access Code: [email or web address] URL: [email or web address] Date: 09/26/2021 Prepared by: Claiborne Billings  Exercises - Standing Terminal Knee Extension at [employer] with Diona Foley  - 1 x daily - 7 x weekly - 2 sets - 12 reps - Sit to Stand Without Arm Support  - 1 x daily - 7 x weekly - 2 sets - 10 reps - Tandem Stance in Corner  - 1 x daily - 7 x weekly - 1 sets - 2-3 reps - 30s hold - Supine Bridge  - 1 x daily - 7 x weekly - 2 sets - 12 reps - Standing Hip Abduction with Resistance at Ankles and Counter Support  - 1 x daily - 7 x weekly - 2 sets - 10 reps - Standing Hip Extension with Resistance at Ankles and Counter Support  - 1 x daily - 7 x weekly - 2 sets - 10 reps - Single Leg Stance  - 1 x daily - 7 x weekly - 1 sets - 3 reps - 5-15s hold - Standing Shoulder External Rotation with Resistance  - 1 x daily - 7 x weekly - 2 sets - 10 reps - Seated Shoulder Horizontal Abduction with Resistance  -  1 x daily - 7 x weekly - 2 sets - 10 reps       GOALS: Goals reviewed with patient? Yes   SHORT TERM GOALS: Target date: 10/18/2021   The patient will demonstrate knowledge of basic self care strategies and exercises for improved strength and mobility Baseline: Goal status: INITIAL   2.  The patient will have an improved BERG balance score to  49  /56 indicating reduced risk of falls  Baseline:  Goal status: INITIAL   3.  5x sit to stand test to 15.5 sec indicating improved LE strength  Baseline:  Goal status: INITIAL   4.  Able to ambulate 600 feet in 4 min  Baseline:  Goal status: INITIAL       LONG TERM GOALS: Target date: 11/15/2021   The patient will be independent with a safe self progressive HEP needed for long term strengthening needed for home and community mobility Baseline:  Goal status: INITIAL   2.  BERG score improved to 51/56 indicating decreased fall risk Baseline:  Goal status: INITIAL   3.  Patient will complete 5x sit to stand in  14 sec less indicating improved LE muscular endurance for activity tolerance  Baseline:  Goal status: INITIAL   4.  Gait 700 feet in 6 minutes with perceived exertion of 12/20 Baseline:  Goal status: INITIAL   5.  The patient will have improved LE strength to at least 4+/5 needed for standing, walking longer distances and negotiating curbs at home and in the community  Baseline:  Goal status: INITIAL     ASSESSMENT:   CLINICAL IMPRESSION: First time follow-up after evaluation.  Pt reports that she is doing the exercises issued at this clinic 1 year ago.  Session spent reviewing each exercise and working on strength, balance and endurance.  PT provided close supervision and guarding for all activity. Pt required moderate verbal cues for alignment and speed of movement.  Pt was fatigued with exercise and required rest breaks between sets.    She would benefit in PT for strengthening and balance ex's.       OBJECTIVE  IMPAIRMENTS decreased activity tolerance, decreased endurance, difficulty walking, decreased strength, and impaired perceived functional ability.    ACTIVITY LIMITATIONS cleaning, meal prep, laundry, and shopping.    PERSONAL FACTORS Age and 1 comorbidity: fibromyalgia  are also affecting patient's functional outcome.      REHAB POTENTIAL: Good   CLINICAL DECISION MAKING: Stable/uncomplicated   EVALUATION COMPLEXITY: Low   PLAN: PT FREQUENCY: 2x/week   PT DURATION: 8 weeks   PLANNED INTERVENTIONS: Therapeutic exercises, Therapeutic activity, Neuromuscular re-education, Balance training, Gait training, Patient/Family education, Joint mobilization, Aquatic Therapy, Moist heat, Taping, and Manual therapy   PLAN FOR NEXT SESSION: dual task balance ex's (putting on jacket), single leg/narrow base of support; Nu-Step;  general LE strengthening (low intensity secondary to fibro)   Sigurd Sos, PT 10/01/21 8:22 AM   [facility] [address], [place], [place] [address] Phone # [phone] Fax [phone]

## 2021-10-04 ENCOUNTER — Ambulatory Visit: Payer: Medicare HMO | Admitting: Physical Therapy

## 2021-10-04 DIAGNOSIS — R2681 Unsteadiness on feet: Secondary | ICD-10-CM

## 2021-10-04 DIAGNOSIS — M6281 Muscle weakness (generalized): Secondary | ICD-10-CM

## 2021-10-04 NOTE — Therapy (Signed)
?OUTPATIENT PHYSICAL THERAPY TREATMENT NOTE ? ? ?Patient Name: Belinda Mcdowell ?MRN: [id number] ?DOB:06-18-1940, 81 y.o., female ?Today's Date: 10/04/2021 ? ?PCP: Dr. Lauretta Grill [place] ?REFERRING PROVIDER: Dr. Lujean Amel ? ?END OF SESSION:  ? PT End of Session - 10/04/21 1607   ? ? Visit Number 3   ? Date for PT Re-Evaluation 11/15/21   ? Authorization Type [employer]   ? Progress Note Due on Visit 10   ? PT Start Time 1609   ? PT Stop Time 1648 ice secondary to severe pain today  ? PT Time Calculation (min) 39 min   ? Activity Tolerance Patient tolerated treatment well   ? ?  ?  ? ?  ? ? ?Past Medical History:  ?Diagnosis Date  ? Abnormal SPEP 02/21/2015  ? Allergic rhinitis, cause unspecified   ? Allergy   ? states she has environmental allergies; and takes zyrtec  ? Anemia, unspecified 11/18/2011  ? Prior on aranesp  ? Anxiety   ? Arthritis   ? Chronic fatigue 02/21/2015  ? 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 Degenerative joint disease 11/18/2011  ? diffuse  ? Depression   ? Fibromyalgia 11/18/2011  ? Generalized headaches   ? Hyperlipidemia   ? Increased urinary frequency 02/21/2015  ? Insomnia   ? 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 Lumbar disc disease 11/18/2011  ? Mucous retention cyst of maxillary sinus 11/18/2011  ? Right   ? Non-Hodgkin lymphoma ([place]) 11/18/2011  ? Stage 3  ? Psoriasis 11/18/2011  ? On MTX  ? PUD (peptic ulcer disease) 11/18/2011  ? Duodenal at age 51 and 68  ? PVC's (premature ventricular contractions)   ? by holter  previously thought to be A. fib  ? Sinus bradycardia   ? UTI (lower urinary tract infection)   ? ?Past Surgical History:  ?Procedure Laterality Date  ? ABDOMINAL HYSTERECTOMY    ? for dysfunctional bleeding  ? APPENDECTOMY    ? ?Patient Active Problem List  ? Diagnosis Date Noted  ? Serum sickness due to drug 07/19/2015  ? Meningioma ([place]) 03/12/2015  ? 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 Abnormal SPEP 02/21/2015   ? 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 Increased urinary frequency 02/21/2015  ? Chronic fatigue 02/21/2015  ? Mouth pain 12/02/2014  ? General weakness 12/02/2014  ? Dysuria 04/19/2014  ? Right flank pain 04/19/2014  ? Urinary frequency 12/10/2013  ? Essential hypertension 03/19/2013  ? Preventative health care 12/08/2012  ? Conjunctivitis, allergic 06/10/2012  ? Exercise intolerance 02/25/2012  ? History of non-Hodgkin's lymphoma 11/18/2011  ? Fibromyalgia 11/18/2011  ? Mucous retention cyst of maxillary sinus 11/18/2011  ? Psoriasis 11/18/2011  ? Anemia, unspecified 11/18/2011  ? PUD (peptic ulcer disease) 11/18/2011  ? Degenerative joint disease 11/18/2011  ? Lumbar disc disease 11/18/2011  ? Allergic rhinitis   ? Depression   ? Insomnia   ? Hyperlipidemia   ? Anxiety   ? Palpitation-Previously thought to be atrial fibrillation now with Holter data to suggest PVCs   ? ? ?REFERRING DIAG: deconditioning; weakness ? ?THERAPY DIAG:  ?Muscle weakness (generalized) ? ?Unsteadiness on feet ? ?PERTINENT HISTORY: fibromyalgia; history of cancer ? ?PRECAUTIONS: fibromyalgia  ? ?SUBJECTIVE: I'm not doing well today.  I had a massage on Tuesday and ever since I've been in terrible pain in my back.  I do fine during PT but then later I'm in a lot for pain  for 2 days. What I did last time--  It's too much. ? ?PAIN:  ?Are you having pain? PAIN:  ?Are you having pain? Yes ?NPRS scale: 10/10 ?Pain location: back ? ? ?OBJECTIVE: (objective measures completed at initial evaluation unless otherwise dated)  ?09/20/21 ?DIAGNOSTIC FINDINGS: none ?  ?COGNITION: ?Overall cognitive status: Within functional limits for tasks assessed ?            ?  ?POSTURE: rounded shoulders and forward head ?  ?LE ROM:   WFLs ?  ?MMT:  LEs grossly 4/5 except hip abductors 4-/5 bil;  able to rise from chair without UE assist ?  ?  ?  ?  ?Comments: Loss of balance with putting on rain coat, min assist to stabilize ?  ?FUNCTIONAL TESTs: no cane ?5  times sit to stand: 16 ?Timed up and go (TUG): to be done next visit  ?2 minute walk test: 3 min walk test 12/20 Perceived exertion 500 feet ?Berg Balance Scale: 46/56 ?  ?  ?  ?TODAY'S TREATMENT:  ?5/11: ?Seated isometrics:   ?quad sets,  ?ball squeeze,  ?push into blue band 5 sec holds 10x ?Seated core: 4# plyo ball: hip to hip, shoulder to hip, rock to baby, ear to ear 10x each; ?Ice pack to low back 10 min for pain control ? ? ? ? ? ? ? ?Treatment on date: 09/26/21 ?NuStep: Level 1x 8 minutes- PT present to discuss progress.  ?Sit to stand: 2x10 ?Tandem stance: 3x 20 seconds-min UE support  ?Hip abduction and extension: 2# added x10 bil each- moderate UE support ? Seated march: 2# added 2x10 ?Standing rockerboard x1 min ?Balance:  ?Box stepping x 8 laps each way with min UE support as needed ?Alternating tap on low conex 20 taps   ?SLS: 15 seconds x 2 Rt and Lt ?Mini tramp: weight shifting 3 ways x 1 min each ?  ?  ?PATIENT EDUCATION: ?Education details: Access Code: [email or web address] ?Person educated: Patient ?Education method: Explanation, demo ?Education comprehension: verbalized understanding, return demo ?  ?  ?HOME EXERCISE PROGRAM: ?From prior session of PT last year:  ?Access Code: [email or web address] ?URL: [email or web address] ?Date: 09/26/2021 ?Prepared by: Claiborne Billings ? ?Exercises ?- Standing Terminal Knee Extension at Wall with Ball  - 1 x daily - 7 x weekly - 2 sets - 12 reps ?- Sit to Stand Without Arm Support  - 1 x daily - 7 x weekly - 2 sets - 10 reps ?- Tandem Stance in Corner  - 1 x daily - 7 x weekly - 1 sets - 2-3 reps - 30s hold ?- Supine Bridge  - 1 x daily - 7 x weekly - 2 sets - 12 reps ?- Standing Hip Abduction with Resistance at Ankles and Counter Support  - 1 x daily - 7 x weekly - 2 sets - 10 reps ?- Standing Hip Extension with Resistance at Ankles and Counter Support  - 1 x daily - 7 x weekly - 2 sets - 10 reps ?- Single Leg Stance  - 1 x daily - 7 x weekly - 1 sets - 3 reps - 5-15s hold ?-  Standing Shoulder External Rotation with Resistance  - 1 x daily - 7 x weekly - 2 sets - 10 reps ?- Seated Shoulder Horizontal Abduction with Resistance  - 1 x daily - 7 x weekly - 2 sets - 10 reps ?  ?  ?  ?GOALS: ?Goals reviewed with patient? Yes ?  ?SHORT TERM GOALS:  Target date: 10/18/2021 ?  ?The patient will demonstrate knowledge of basic self care strategies and exercises for improved strength and mobility ?Baseline: ?Goal status: INITIAL ?  ?2.  The patient will have an improved BERG balance score to  49  /56 indicating reduced risk of falls  ?Baseline:  ?Goal status: INITIAL ?  ?3.  5x sit to stand test to 15.5 sec indicating improved LE strength  ?Baseline:  ?Goal status: INITIAL ?  ?4.  Able to ambulate 600 feet in 4 min  ?Baseline:  ?Goal status: INITIAL ?  ?  ?  ?LONG TERM GOALS: Target date: 11/15/2021 ?  ?The patient will be independent with a safe self progressive HEP needed for long term strengthening needed for home and community mobility ?Baseline:  ?Goal status: INITIAL ?  ?2.  BERG score improved to 51/56 indicating decreased fall risk ?Baseline:  ?Goal status: INITIAL ?  ?3.  Patient will complete 5x sit to stand in  14 sec less indicating improved LE muscular endurance for activity tolerance  ?Baseline:  ?Goal status: INITIAL ?  ?4.  Gait 700 feet in 6 minutes with perceived exertion of 12/20 ?Baseline:  ?Goal status: INITIAL ?  ?5.  The patient will have improved LE strength to at least 4+/5 needed for standing, walking longer distances and negotiating curbs at home and in the community  ?Baseline:  ?Goal status: INITIAL ?  ?  ?ASSESSMENT: ?  ?CLINICAL IMPRESSION: ?Treatment modified secondary to reports of severe low back pain today.  Performed submax isometrics for analgesic benefits as well as low level strengthening.  She is fairly tolerant to this in a seated position.  As pain subsides will return to low level balance ex but too painful to endure today.   ?  ?OBJECTIVE IMPAIRMENTS  decreased activity tolerance, decreased endurance, difficulty walking, decreased strength, and impaired perceived functional ability.  ?  ?ACTIVITY LIMITATIONS cleaning, meal prep, laundry, and shopping.  ?  ?PERSO

## 2021-10-08 ENCOUNTER — Encounter: Payer: Self-pay | Admitting: Physical Therapy

## 2021-10-08 ENCOUNTER — Ambulatory Visit: Payer: Medicare HMO | Admitting: Physical Therapy

## 2021-10-08 DIAGNOSIS — R2681 Unsteadiness on feet: Secondary | ICD-10-CM

## 2021-10-08 DIAGNOSIS — M6281 Muscle weakness (generalized): Secondary | ICD-10-CM

## 2021-10-08 NOTE — Therapy (Signed)
?OUTPATIENT PHYSICAL THERAPY TREATMENT NOTE ? ? ?Patient Name: Belinda Mcdowell ?MRN: [id number] ?DOB:1941-04-14, 81 y.o., female ?Today's Date: 10/08/2021 ? ?PCP: Dr. Lauretta Grill [place] ?REFERRING PROVIDER: Dr. Lujean Amel ? ?END OF SESSION:  ? PT End of Session - 10/04/21 1607   ? ? Visit Number 3   ? Date for PT Re-Evaluation 11/15/21   ? Authorization Type Aetna   ? Progress Note Due on Visit 10   ? PT Start Time 1609   ? PT Stop Time 1648 ice secondary to severe pain today  ? PT Time Calculation (min) 39 min   ? Activity Tolerance Patient tolerated treatment well   ? ?  ?  ? ?  ? ? ?Past Medical History:  ?Diagnosis Date  ? Abnormal SPEP 02/21/2015  ? Allergic rhinitis, cause unspecified   ? Allergy   ? states she has environmental allergies; and takes zyrtec  ? Anemia, unspecified 11/18/2011  ? Prior on aranesp  ? Anxiety   ? Arthritis   ? Chronic fatigue 02/21/2015  ? 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 Degenerative joint disease 11/18/2011  ? diffuse  ? Depression   ? Fibromyalgia 11/18/2011  ? Generalized headaches   ? Hyperlipidemia   ? Increased urinary frequency 02/21/2015  ? Insomnia   ? 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 Lumbar disc disease 11/18/2011  ? Mucous retention cyst of maxillary sinus 11/18/2011  ? Right   ? Non-Hodgkin lymphoma ([place]) 11/18/2011  ? Stage 3  ? Psoriasis 11/18/2011  ? On MTX  ? PUD (peptic ulcer disease) 11/18/2011  ? Duodenal at age 29 and 35  ? PVC's (premature ventricular contractions)   ? by holter  previously thought to be A. fib  ? Sinus bradycardia   ? UTI (lower urinary tract infection)   ? ?Past Surgical History:  ?Procedure Laterality Date  ? ABDOMINAL HYSTERECTOMY    ? for dysfunctional bleeding  ? APPENDECTOMY    ? ?Patient Active Problem List  ? Diagnosis Date Noted  ? Serum sickness due to drug 07/19/2015  ? Meningioma ([place]) 03/12/2015  ? 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 Abnormal SPEP 02/21/2015   ? 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 Increased urinary frequency 02/21/2015  ? Chronic fatigue 02/21/2015  ? Mouth pain 12/02/2014  ? General weakness 12/02/2014  ? Dysuria 04/19/2014  ? Right flank pain 04/19/2014  ? Urinary frequency 12/10/2013  ? Essential hypertension 03/19/2013  ? Preventative health care 12/08/2012  ? Conjunctivitis, allergic 06/10/2012  ? Exercise intolerance 02/25/2012  ? History of non-Hodgkin's lymphoma 11/18/2011  ? Fibromyalgia 11/18/2011  ? Mucous retention cyst of maxillary sinus 11/18/2011  ? Psoriasis 11/18/2011  ? Anemia, unspecified 11/18/2011  ? PUD (peptic ulcer disease) 11/18/2011  ? Degenerative joint disease 11/18/2011  ? Lumbar disc disease 11/18/2011  ? Allergic rhinitis   ? Depression   ? Insomnia   ? Hyperlipidemia   ? Anxiety   ? Palpitation-Previously thought to be atrial fibrillation now with Holter data to suggest PVCs   ? ? ?REFERRING DIAG: deconditioning; weakness ? ?THERAPY DIAG:  ?Muscle weakness (generalized) ? ?Unsteadiness on feet ? ?PERTINENT HISTORY: fibromyalgia; history of cancer ? ?PRECAUTIONS: fibromyalgia  ? ?SUBJECTIVE: I am a little better thanks to my last therapist. The ice really helped. ? ?PAIN:  ?Are you having pain? PAIN:  ?Are you having pain? Yes ?NPRS scale: 10/10 ?Pain location: back ? ? ?OBJECTIVE: (objective  measures completed at initial evaluation unless otherwise dated)  ?09/20/21 ?DIAGNOSTIC FINDINGS: none ?  ?COGNITION: ?Overall cognitive status: Within functional limits for tasks assessed ?            ?  ?POSTURE: rounded shoulders and forward head ?  ?LE ROM:   WFLs ?  ?MMT:  LEs grossly 4/5 except hip abductors 4-/5 bil;  able to rise from chair without UE assist ?  ?  ?  ?  ?Comments: Loss of balance with putting on rain coat, min assist to stabilize ?  ?FUNCTIONAL TESTs: no cane ?5 times sit to stand: 16 ?Timed up and go (TUG): to be done next visit  ?2 minute walk test: 3 min walk test 12/20 Perceived exertion 500 feet ?Berg  Balance Scale: 46/56 ?  ?  ?  ?TODAY'S TREATMENT:  ? ?10/08/21: ?Ball squeeze with TA contraction 3 sec hold 10x, VC to contract easy ?Seated blue band pull outward in horizontal/diagonal way 5x each side ?Green loop hip ER 10x ?Seated core: 4# plyo ball: hip to hip, shoulder to hip, rock to baby, ear to ear 10x each ?Manual: In LT sidelying with RTLE supported;soft tissue/MFR mobilization to lumbar and gluteal group ?Ice pack to low back 10 min for pain control ? ? ?5/11: ?Seated isometrics:   ?quad sets,  ?ball squeeze,  ?push into blue band 5 sec holds 10x ?Seated core: 4# plyo ball: hip to hip, shoulder to hip, rock to baby, ear to ear 10x each; ?Ice pack to low back 10 min for pain control ? ? ? ? ? ? ? ?Treatment on date: 09/26/21 ?NuStep: Level 1x 8 minutes- PT present to discuss progress.  ?Sit to stand: 2x10 ?Tandem stance: 3x 20 seconds-min UE support  ?Hip abduction and extension: 2# added x10 bil each- moderate UE support ? Seated march: 2# added 2x10 ?Standing rockerboard x1 min ?Balance:  ?Box stepping x 8 laps each way with min UE support as needed ?Alternating tap on low conex 20 taps   ?SLS: 15 seconds x 2 Rt and Lt ?Mini tramp: weight shifting 3 ways x 1 min each ?  ?  ?PATIENT EDUCATION: ?Education details: Access Code: [email or web address] ?Person educated: Patient ?Education method: Explanation, demo ?Education comprehension: verbalized understanding, return demo ?  ?  ?HOME EXERCISE PROGRAM: ?From prior session of PT last year:  ?Access Code: [email or web address] ?URL: [email or web address] ?Date: 09/26/2021 ?Prepared by: Claiborne Billings ? ?Exercises ?- Standing Terminal Knee Extension at Wall with Ball  - 1 x daily - 7 x weekly - 2 sets - 12 reps ?- Sit to Stand Without Arm Support  - 1 x daily - 7 x weekly - 2 sets - 10 reps ?- Tandem Stance in Corner  - 1 x daily - 7 x weekly - 1 sets - 2-3 reps - 30s hold ?- Supine Bridge  - 1 x daily - 7 x weekly - 2 sets - 12 reps ?- Standing Hip Abduction with Resistance at  Ankles and Counter Support  - 1 x daily - 7 x weekly - 2 sets - 10 reps ?- Standing Hip Extension with Resistance at Ankles and Counter Support  - 1 x daily - 7 x weekly - 2 sets - 10 reps ?- Single Leg Stance  - 1 x daily - 7 x weekly - 1 sets - 3 reps - 5-15s hold ?- Standing Shoulder External Rotation with Resistance  - 1 x daily - 7 x weekly - 2 sets - 10  reps ?- Seated Shoulder Horizontal Abduction with Resistance  - 1 x daily - 7 x weekly - 2 sets - 10 reps ?  ?  ?  ?GOALS: ?Goals reviewed with patient? Yes ?  ?SHORT TERM GOALS: Target date: 10/18/2021 ?  ?The patient will demonstrate knowledge of basic self care strategies and exercises for improved strength and mobility ?Baseline: ?Goal status: INITIAL ?  ?2.  The patient will have an improved BERG balance score to  49  /56 indicating reduced risk of falls  ?Baseline:  ?Goal status: INITIAL ?  ?3.  5x sit to stand test to 15.5 sec indicating improved LE strength  ?Baseline:  ?Goal status: INITIAL ?  ?4.  Able to ambulate 600 feet in 4 min  ?Baseline:  ?Goal status: INITIAL ?  ?  ?  ?LONG TERM GOALS: Target date: 11/15/2021 ?  ?The patient will be independent with a safe self progressive HEP needed for long term strengthening needed for home and community mobility ?Baseline:  ?Goal status: INITIAL ?  ?2.  BERG score improved to 51/56 indicating decreased fall risk ?Baseline:  ?Goal status: INITIAL ?  ?3.  Patient will complete 5x sit to stand in  14 sec less indicating improved LE muscular endurance for activity tolerance  ?Baseline:  ?Goal status: INITIAL ?  ?4.  Gait 700 feet in 6 minutes with perceived exertion of 12/20 ?Baseline:  ?Goal status: INITIAL ?  ?5.  The patient will have improved LE strength to at least 4+/5 needed for standing, walking longer distances and negotiating curbs at home and in the community  ?Baseline:  ?Goal status: INITIAL ?  ?  ?ASSESSMENT: ?  ?CLINICAL IMPRESSION: ?Pt arrives for PT session reporting she is starting to feel a  little better despite the same 10/10 pain rating. Pt was able to complete previous TE in addition to soft tissue work to lumbar and gluteals muscles. Pt repots pain down to 6/10 at end of session. ?   ?OBJ

## 2021-10-11 ENCOUNTER — Ambulatory Visit: Payer: Medicare HMO | Admitting: Physical Therapy

## 2021-10-11 DIAGNOSIS — M6281 Muscle weakness (generalized): Secondary | ICD-10-CM | POA: Diagnosis not present

## 2021-10-11 DIAGNOSIS — R2681 Unsteadiness on feet: Secondary | ICD-10-CM

## 2021-10-11 NOTE — Therapy (Signed)
OUTPATIENT PHYSICAL THERAPY TREATMENT NOTE   Patient Name: Belinda Mcdowell MRN: [id number] DOB:13-Nov-1940, 81 y.o., female Today's Date: 10/11/2021  PCP: Dr. Lujean Amel REFERRING PROVIDER: Dr. Lujean Amel  END OF SESSION:   PT End of Session - 10/04/21 1607     Visit Number 4   Date for PT Re-Evaluation 11/15/21    Authorization Type Aetna    Progress Note Due on Visit 10    PT Start Time 1615   PT Stop Time 1705 ice    PT Time Calculation (min) 48 min    Activity Tolerance Patient tolerated treatment well             Past Medical History:  Diagnosis Date   Abnormal SPEP 02/21/2015   Allergic rhinitis, cause unspecified    Allergy    states she has environmental allergies; and takes zyrtec   Anemia, unspecified 11/18/2011   Prior on aranesp   Anxiety    Arthritis    Chronic fatigue 02/21/2015   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   Abnormal SPEP 02/21/2015   Chronic headache disorder  02/21/2015   Increased urinary frequency 02/21/2015   Chronic fatigue 02/21/2015   Mouth pain 12/02/2014   General weakness 12/02/2014   Dysuria 04/19/2014   Right flank pain 04/19/2014   Urinary frequency 12/10/2013   Essential hypertension 03/19/2013   Preventative health care 12/08/2012   Conjunctivitis, allergic 06/10/2012   Exercise intolerance 02/25/2012   History of non-Hodgkin's lymphoma 11/18/2011   Fibromyalgia 11/18/2011   Mucous retention cyst of maxillary sinus 11/18/2011   Psoriasis 11/18/2011   Anemia, unspecified 11/18/2011   PUD (peptic ulcer disease) 11/18/2011   Degenerative joint disease 11/18/2011   Lumbar disc disease 11/18/2011   Allergic rhinitis    Depression    Insomnia    Hyperlipidemia    Anxiety    Palpitation-Previously thought to be atrial fibrillation now with Holter data to suggest PVCs     REFERRING DIAG: deconditioning; weakness  THERAPY DIAG:  Muscle weakness (generalized)  Unsteadiness on feet  PERTINENT HISTORY: fibromyalgia; history of cancer  PRECAUTIONS: fibromyalgia   SUBJECTIVE: I'm feeling so much better than I was.  This treatment and the ice have really helped me.    PAIN:  Are you having pain? PAIN:  Are you having pain? Yes NPRS scale: 6/10 Pain location: back   OBJECTIVE: (objective  measures completed at initial evaluation unless otherwise dated)  09/20/21 DIAGNOSTIC FINDINGS: none   COGNITION: Overall cognitive status: Within functional limits for tasks assessed               POSTURE: rounded shoulders and forward head   LE ROM:   WFLs   MMT:  LEs grossly 4/5 except hip abductors 4-/5 bil;  able to rise from chair without UE assist         Comments: Loss of balance with putting on rain coat, min assist to stabilize   FUNCTIONAL TESTs: no cane 5 times sit to stand: 16 Timed up and go (TUG): to be done next visit  2 minute walk test: 3 min walk test 12/20 Perceived exertion 500 feet Berg Balance  Scale: 46/56       TODAY'S TREATMENT:  5/18: Ball squeeze with TA contraction 3 sec hold 10x, VC to contract easy Seated yellow 4# ball reach down and pull up 10x Green band clams submax isometrics 10x Seated core: 4# plyo ball: hip to hip, shoulder to hip, rock to baby, ear to ear 10x each Manual: In LT sidelying with RTLE supported;soft tissue/MFR mobilization with and without Addaday to lumbar and gluteal group; neutral gapping, pelvic distraction; supine right long axis distraction grade 2  Ice pack to low back 10 min for pain control     10/08/21: Ball squeeze with TA contraction 3 sec hold 10x, VC to contract easy Seated blue band pull outward in horizontal/diagonal way 5x each side Green loop hip ER 10x Seated core: 4# plyo ball: hip to hip, shoulder to hip, rock to baby, ear to ear 10x each Manual: In LT sidelying with RTLE supported;soft tissue/MFR mobilization to lumbar and gluteal group Ice pack to low back 10 min for pain control   5/11: Seated isometrics:   quad sets,  ball squeeze,  push into blue band 5 sec holds 10x Seated core: 4# plyo ball: hip to hip, shoulder to hip, rock to baby, ear to ear 10x each; Ice pack to low back 10 min for pain control        Treatment on date: 09/26/21 NuStep: Level 1x 8 minutes- PT present to discuss progress.  Sit to stand: 2x10 Tandem stance: 3x 20 seconds-min UE support  Hip abduction and extension: 2# added x10 bil each- moderate UE support  Seated march: 2# added 2x10 Standing rockerboard x1 min Balance:  Box stepping x 8 laps each way with min UE support as needed Alternating tap on low conex 20 taps   SLS: 15 seconds x 2 Rt and Lt Mini tramp: weight shifting 3 ways x 1 min each     PATIENT EDUCATION: Education details: Access Code: [email or web address] Person educated: Patient Education method: Explanation, demo Education comprehension: verbalized understanding, return demo     [place]: From prior  session of PT last year:  Access Code: [email or web address] URL: [email or web address] Date: 09/26/2021 Prepared by: Claiborne Billings  Exercises - Standing Terminal Knee Extension at [employer] with Diona Foley  - 1 x daily - 7 x weekly - 2 sets - 12 reps - Sit to Stand Without Arm Support  - 1 x daily - 7 x weekly - 2 sets - 10 reps - Tandem Stance in Corner  - 1 x daily - 7 x weekly - 1 sets - 2-3 reps - 30s hold - Supine Bridge  - 1 x daily - 7 x weekly - 2 sets - 12 reps - Standing Hip  Abduction with Resistance at Ankles and Counter Support  - 1 x daily - 7 x weekly - 2 sets - 10 reps - Standing Hip Extension with Resistance at Ankles and Counter Support  - 1 x daily - 7 x weekly - 2 sets - 10 reps - Single Leg Stance  - 1 x daily - 7 x weekly - 1 sets - 3 reps - 5-15s hold - Standing Shoulder External Rotation with Resistance  - 1 x daily - 7 x weekly - 2 sets - 10 reps - Seated Shoulder Horizontal Abduction with Resistance  - 1 x daily - 7 x weekly - 2 sets - 10 reps       GOALS: Goals reviewed with patient? Yes   SHORT TERM GOALS: Target date: 10/18/2021   The patient will demonstrate knowledge of basic self care strategies and exercises for improved strength and mobility Baseline: Goal status: INITIAL   2.  The patient will have an improved BERG balance score to  49  /56 indicating reduced risk of falls  Baseline:  Goal status: INITIAL   3.  5x sit to stand test to 15.5 sec indicating improved LE strength  Baseline:  Goal status: INITIAL   4.  Able to ambulate 600 feet in 4 min  Baseline:  Goal status: INITIAL       LONG TERM GOALS: Target date: 11/15/2021   The patient will be independent with a safe self progressive HEP needed for long term strengthening needed for home and community mobility Baseline:  Goal status: INITIAL   2.  BERG score improved to 51/56 indicating decreased fall risk Baseline:  Goal status: INITIAL   3.  Patient will complete 5x sit to stand in  14 sec less  indicating improved LE muscular endurance for activity tolerance  Baseline:  Goal status: INITIAL   4.  Gait 700 feet in 6 minutes with perceived exertion of 12/20 Baseline:  Goal status: INITIAL   5.  The patient will have improved LE strength to at least 4+/5 needed for standing, walking longer distances and negotiating curbs at home and in the community  Baseline:  Goal status: INITIAL     ASSESSMENT:   CLINICAL IMPRESSION:  The patient has been unable to tolerate standing balance ex's secondary to an extreme fibromyalgia flare up following exercises.  Ex's have been scaled significantly back secondary to 10/10 pain levels.  She has responded well to pain modulation techniques.  Will need to do very graded re-exposure to exercise.       OBJECTIVE IMPAIRMENTS decreased activity tolerance, decreased endurance, difficulty walking, decreased strength, and impaired perceived functional ability.    ACTIVITY LIMITATIONS cleaning, meal prep, laundry, and shopping.    PERSONAL FACTORS Age and 1 comorbidity: fibromyalgia  are also affecting patient's functional outcome.      REHAB POTENTIAL: Good   CLINICAL DECISION MAKING: Stable/uncomplicated   EVALUATION COMPLEXITY: Low   PLAN: PT FREQUENCY: 2x/week   PT DURATION: 8 weeks   PLANNED INTERVENTIONS: Therapeutic exercises, Therapeutic activity, Neuromuscular re-education, Balance training, Gait training, Patient/Family education, Joint mobilization, Aquatic Therapy, Moist heat, Taping, and Manual therapy   PLAN FOR NEXT SESSION:  needs low intensity exercise secondary to reports of flare ups after PT;  prefers ice instead of heat;   low level balance in small doses (fibroymyalgia);  single leg/narrow base of support; Nu-Step;  general LE strengthening  Ruben Im, PT 10/11/21 4:58 PM Phone: [phone] Fax: [phone]  Phone: [phone] Fax:  [place] [address] [place], [place] [address] Phone # [phone] Fax [phone]

## 2021-10-15 ENCOUNTER — Ambulatory Visit: Payer: Medicare HMO

## 2021-10-15 DIAGNOSIS — R2681 Unsteadiness on feet: Secondary | ICD-10-CM | POA: Diagnosis not present

## 2021-10-15 DIAGNOSIS — M6281 Muscle weakness (generalized): Secondary | ICD-10-CM

## 2021-10-15 NOTE — Therapy (Signed)
OUTPATIENT PHYSICAL THERAPY TREATMENT NOTE   Patient Name: Belinda Mcdowell MRN: [id number] DOB:September 24, 1940, 81 y.o., female Today's Date: 10/15/2021  PCP: Dr. Lujean Amel REFERRING PROVIDER: Dr. Lujean Amel  END OF SESSION:   PT End of Session - 10/04/21 1607     Visit Number 4   Date for PT Re-Evaluation 11/15/21    Authorization Type [employer]    Progress Note Due on Visit 10    PT Start Time 1615   PT Stop Time 1705 ice    PT Time Calculation (min) 48 min    Activity Tolerance Patient tolerated treatment well             Past Medical History:  Diagnosis Date   Abnormal SPEP 02/21/2015   Allergic rhinitis, cause unspecified    Allergy    states she has environmental allergies; and takes zyrtec   Anemia, unspecified 11/18/2011   Prior on aranesp   Anxiety    Arthritis    Chronic fatigue 02/21/2015   Chronic headache disorder 02/21/2015   Degenerative joint disease 11/18/2011   diffuse   Depression    Fibromyalgia 11/18/2011   Generalized headaches    Hyperlipidemia    Increased urinary frequency 02/21/2015   Insomnia    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   Abnormal SPEP 02/21/2015   Chronic headache disorder  02/21/2015   Increased urinary frequency 02/21/2015   Chronic fatigue 02/21/2015   Mouth pain 12/02/2014   General weakness 12/02/2014   Dysuria 04/19/2014   Right flank pain 04/19/2014   Urinary frequency 12/10/2013   Essential hypertension 03/19/2013   Preventative health care 12/08/2012   Conjunctivitis, allergic 06/10/2012   Exercise intolerance 02/25/2012   History of non-Hodgkin's lymphoma 11/18/2011   Fibromyalgia 11/18/2011   Mucous retention cyst of maxillary sinus 11/18/2011   Psoriasis 11/18/2011   Anemia, unspecified 11/18/2011   PUD (peptic ulcer disease) 11/18/2011   Degenerative joint disease 11/18/2011   Lumbar disc disease 11/18/2011   Allergic rhinitis    Depression    Insomnia    Hyperlipidemia    Anxiety    Palpitation-Previously thought to be atrial fibrillation now with Holter data to suggest PVCs     REFERRING DIAG: deconditioning; weakness  THERAPY DIAG:  Muscle weakness (generalized)  Unsteadiness on feet  PERTINENT HISTORY: fibromyalgia; history of cancer  PRECAUTIONS: fibromyalgia   SUBJECTIVE: I'm feeling so much better than I was.  This treatment and the ice have really helped me.    PAIN:  Are you having pain? PAIN:  Are you having pain? Yes NPRS scale: 7/10 Pain location: Rt back and buttock I'm  in a flare so it just hurts    OBJECTIVE: (objective measures completed at initial evaluation unless otherwise dated)  09/20/21 DIAGNOSTIC FINDINGS: none   COGNITION: Overall cognitive status: Within functional limits for tasks assessed               POSTURE: rounded shoulders and forward head   LE ROM:   WFLs   MMT:  LEs grossly 4/5 except hip abductors 4-/5 bil;  able to rise from chair without UE assist         Comments: Loss of balance with putting on rain coat, min assist to stabilize   FUNCTIONAL TESTs: no cane 5 times sit to stand: 16 Timed up and go (TUG): to be done next visit  2 minute walk test: 3 min walk test  12/20 Perceived exertion 500 feet Berg Balance Scale: 46/56       TODAY'S TREATMENT:  5/22: Ball squeeze with TA contraction 3 sec hold 10x in hooklying Seated yellow 4# ball reach down and pull up 10x Green band clams submax isometrics 10x supine in hooklying Seated core: 4# plyo ball: hip to hip, shoulder to hip, rock to baby, ear to ear 10x each Hooklying: TA activation with marching x10 each Manual: In Lt sidelying with Rt LE supported;soft tissue/MFR mobilization with Addaday  Ice pack to low back 10 min for pain control 5/18: Ball squeeze with TA contraction 3 sec hold 10x, VC to contract easy Seated yellow 4# ball reach down and pull up 10x Green band clams submax isometrics 10x Seated core: 4# plyo ball: hip to hip, shoulder to hip, rock to baby, ear to ear 10x each Manual: In LT sidelying with RTLE supported;soft tissue/MFR mobilization with and without Addaday to lumbar and gluteal group; neutral gapping, pelvic distraction; supine right long axis distraction grade 2  Ice pack to low back 10 min for pain control  10/08/21: Ball squeeze with TA contraction 3 sec hold 10x, VC to contract easy Seated blue band pull outward in horizontal/diagonal way 5x each side Green loop hip ER 10x Seated core: 4# plyo ball: hip to hip, shoulder to hip, rock to baby, ear to ear 10x each Manual: In LT sidelying with RTLE supported;soft tissue/MFR mobilization to lumbar and gluteal group Ice pack to low back 10 min for pain control   PATIENT EDUCATION: Education details: Access Code: [email or web address] Person educated: Patient Education method: Explanation, demo Education comprehension: verbalized understanding, return demo     HOME EXERCISE PROGRAM: From prior session of PT last year:  Access Code: [email or web address] URL: [email or web address] Date: 09/26/2021 Prepared by: Claiborne Billings  Exercises - Standing Terminal Knee Extension at [employer] with Diona Foley  - 1 x daily - 7 x weekly - 2 sets - 12  reps - Sit to Stand Without Arm Support  - 1 x daily - 7 x weekly - 2 sets - 10 reps - Tandem Stance in Corner  - 1 x daily - 7 x weekly - 1 sets - 2-3 reps - 30s hold - Supine Bridge  - 1 x daily - 7 x weekly - 2 sets - 12 reps - Standing Hip Abduction with Resistance at Ankles and Counter Support  - 1 x daily - 7 x weekly - 2 sets - 10 reps - Standing Hip Extension with Resistance at Ankles and Counter Support  - 1 x daily - 7 x weekly - 2 sets - 10 reps - Single Leg Stance  - 1 x daily - 7 x  weekly - 1 sets - 3 reps - 5-15s hold - Standing Shoulder External Rotation with Resistance  - 1 x daily - 7 x weekly - 2 sets - 10 reps - Seated Shoulder Horizontal Abduction with Resistance  - 1 x daily - 7 x weekly - 2 sets - 10 reps       GOALS: Goals reviewed with patient? Yes   SHORT TERM GOALS: Target date: 10/18/2021   The patient will demonstrate knowledge of basic self care strategies and exercises for improved strength and mobility Baseline: Goal status: In progress   2.  The patient will have an improved BERG balance score to  49/56 indicating reduced risk of falls  Baseline:  Goal status: INITIAL   3.  5x sit to stand test to 15.5 sec indicating improved LE strength  Baseline:  Goal status: INITIAL   4.  Able to ambulate 600 feet in 4 min  Baseline:  Goal status: INITIAL       LONG TERM GOALS: Target date: 11/15/2021   The patient will be independent with a safe self progressive HEP needed for long term strengthening needed for home and community mobility Baseline:  Goal status: INITIAL   2.  BERG score improved to 51/56 indicating decreased fall risk Baseline:  Goal status: INITIAL   3.  Patient will complete 5x sit to stand in  14 sec less indicating improved LE muscular endurance for activity tolerance  Baseline:  Goal status: INITIAL   4.  Gait 700 feet in 6 minutes with perceived exertion of 12/20 Baseline:  Goal status: INITIAL   5.  The patient will have  improved LE strength to at least 4+/5 needed for standing, walking longer distances and negotiating curbs at home and in the community  Baseline:  Goal status: INITIAL     ASSESSMENT:   CLINICAL IMPRESSION:  Pt arrived with 7/10 Rt gluteal and low back pain  She has responded well to pain modulation techniques.  Pt tolerated seated exercise well today.  PT monitored pt for pain and pt limited exercises to 1 set of 10 each.  Pt did well with cocontraction of core and hip muscles in hooklying today and activated hip flexors well with marching. Patient will benefit from skilled PT to address the below impairments and improve overall function.       OBJECTIVE IMPAIRMENTS decreased activity tolerance, decreased endurance, difficulty walking, decreased strength, and impaired perceived functional ability.    ACTIVITY LIMITATIONS cleaning, meal prep, laundry, and shopping.    PERSONAL FACTORS Age and 1 comorbidity: fibromyalgia  are also affecting patient's functional outcome.      REHAB POTENTIAL: Good   CLINICAL DECISION MAKING: Stable/uncomplicated   EVALUATION COMPLEXITY: Low   PLAN: PT FREQUENCY: 2x/week   PT DURATION: 8 weeks   PLANNED INTERVENTIONS: Therapeutic exercises, Therapeutic activity, Neuromuscular re-education, Balance training, Gait training, Patient/Family education, Joint mobilization, Aquatic Therapy, Moist heat, Taping, and Manual therapy   PLAN FOR NEXT SESSION:  needs low intensity exercise secondary to reports of flare ups after PT;  prefers ice instead of heat;   low level balance in small doses (fibroymyalgia);  single leg/narrow base of support; Nu-Step;  general LE strengthening,. Test 5x sit to stand and 2 min walk test.   Sigurd Sos, PT 10/15/21 3:30 PM  [facility] [address] [place], [place] [address] Phone # [phone] Fax [phone]

## 2021-10-17 ENCOUNTER — Ambulatory Visit: Payer: Medicare HMO

## 2021-10-23 ENCOUNTER — Ambulatory Visit: Payer: Medicare HMO | Admitting: Physical Therapy

## 2021-10-23 DIAGNOSIS — M6281 Muscle weakness (generalized): Secondary | ICD-10-CM | POA: Diagnosis not present

## 2021-10-23 DIAGNOSIS — R2681 Unsteadiness on feet: Secondary | ICD-10-CM

## 2021-10-23 NOTE — Therapy (Signed)
OUTPATIENT PHYSICAL THERAPY TREATMENT NOTE   Patient Name: Belinda Mcdowell MRN: [id number] DOB:31-Jul-1940, 81 y.o., female Today's Date: 10/23/2021  PCP: Dr. Lujean Amel REFERRING PROVIDER: Dr. Lujean Amel  END OF SESSION:   PT End of Session - 10/04/21 1607     Visit Number 5   Date for PT Re-Evaluation 11/15/21    Authorization Type Aet[employer]    Progress Note Due on Visit 10    PT Start Time 1456 late   PT Stop Time 1530 ice    PT Time Calculation (min) 34 min    Activity Tolerance Patient tolerated treatment well             Past Medical History:  Diagnosis Date   Abnormal SPEP 02/21/2015   Allergic rhinitis, cause unspecified    Allergy    states she has environmental allergies; and takes zyrtec   Anemia, unspecified 11/18/2011   Prior on aranesp   Anxiety    Arthritis    Chronic fatigue 02/21/2015   Chronic headache disorder 02/21/2015   Degenerative joint disease 11/18/2011   diffuse   Depression    Fibromyalgia 11/18/2011   Generalized headaches    Hyperlipidemia    Increased urinary frequency 02/21/2015   Insomnia    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   Abnormal SPEP 02/21/2015   Chronic headache  disorder 02/21/2015   Increased urinary frequency 02/21/2015   Chronic fatigue 02/21/2015   Mouth pain 12/02/2014   General weakness 12/02/2014   Dysuria 04/19/2014   Right flank pain 04/19/2014   Urinary frequency 12/10/2013   Essential hypertension 03/19/2013   Preventative health care 12/08/2012   Conjunctivitis, allergic 06/10/2012   Exercise intolerance 02/25/2012   History of non-Hodgkin's lymphoma 11/18/2011   Fibromyalgia 11/18/2011   Mucous retention cyst of maxillary sinus 11/18/2011   Psoriasis 11/18/2011   Anemia, unspecified 11/18/2011   PUD (peptic ulcer disease) 11/18/2011   Degenerative joint disease 11/18/2011   Lumbar disc disease 11/18/2011   Allergic rhinitis    Depression    Insomnia    Hyperlipidemia    Anxiety    Palpitation-Previously thought to be atrial fibrillation now with Holter data to suggest PVCs     REFERRING DIAG: deconditioning; weakness  THERAPY DIAG:  Muscle weakness (generalized)  Unsteadiness on feet  PERTINENT HISTORY: fibromyalgia; history of cancer  PRECAUTIONS: fibromyalgia   SUBJECTIVE: I'm better than I was.  Still some pain but it's OK.  PAIN:  Are you having pain? PAIN:  Are you having pain? Yes NPRS scale: 5-6/10 Pain location: Rt back and buttock I'm in a flare so it just hurts  OBJECTIVE: (objective measures completed at initial evaluation unless otherwise dated)  09/20/21 DIAGNOSTIC FINDINGS: none   COGNITION: Overall cognitive status: Within functional limits for tasks assessed               POSTURE: rounded shoulders and forward head   LE ROM:   WFLs   MMT:  LEs grossly 4/5 except hip abductors 4-/5 bil;  able to rise from chair without UE assist         Comments: Loss of balance with putting on rain coat, min assist to stabilize   FUNCTIONAL TESTs: no cane 5 times sit to stand: 16 Timed up and go (TUG): to be done next visit  2 minute walk test: 3 min walk test 12/20 Perceived exertion 500  feet Berg Balance Scale: 46/56       TODAY'S TREATMENT:  5/30: Seated core: 4# plyo ball: hip to hip, shoulder to hip, Seated green loop: hip flexion 10x and knee extension  10x; HS curls 10x each side Seated green band clams 15x; Seated heel raise with 4# ball resting on thigh 10x right/left Standing at the bar: side to side weight shift; front back weight shift 10x each Standing at the bar: side stepping Manual: In Lt sidelying with Rt LE supported;soft tissue/MFR mobilization with Addaday  Ice pack to low back 10 min for pain control        5/22: Ball squeeze with TA contraction 3 sec hold 10x in hooklying Seated yellow 4# ball reach down and pull up 10x Green band clams submax isometrics 10x supine in hooklying Seated core: 4# plyo ball: hip to hip, shoulder to hip, rock to baby, ear to ear 10x each Hooklying: TA activation with marching x10 each Manual: In Lt sidelying with Rt LE supported;soft tissue/MFR mobilization with Addaday  Ice pack to low back 10 min for pain control 5/18: Ball squeeze with TA contraction 3 sec hold 10x, VC to contract easy Seated yellow 4# ball reach down and pull up 10x Green band clams submax isometrics 10x Seated core: 4# plyo ball: hip to hip, shoulder to hip, rock to baby, ear to ear 10x each Manual: In LT sidelying with RTLE supported;soft tissue/MFR mobilization with and without Addaday to lumbar and gluteal group; neutral gapping, pelvic distraction; supine right long axis distraction grade 2  Ice pack to low back 10 min for pain control  10/08/21: Ball squeeze with TA contraction 3 sec hold 10x, VC to contract easy Seated blue band pull outward in horizontal/diagonal way 5x each side Green loop hip ER 10x Seated core: 4# plyo ball: hip to hip, shoulder to hip, rock to baby, ear to ear 10x each Manual: In LT sidelying with RTLE supported;soft tissue/MFR mobilization to lumbar and gluteal group Ice pack to low back 10 min for pain  control   PATIENT EDUCATION: Education details: Access Code: [email or web address] Person educated: Patient Education method: Explanation, demo Education comprehension: verbalized understanding, return demo     HOME EXERCISE PROGRAM: From prior session of PT last year:  Access Code: [email or web address] URL: [email or web address] Date: 09/26/2021 Prepared by: Claiborne Billings  Exercises - Standing Terminal Knee Extension at [employer] with Diona Foley  - 1 x daily - 7 x weekly - 2 sets - 12 reps - Sit to Stand Without Arm Support  - 1 x daily - 7 x weekly - 2 sets - 10 reps - Tandem Stance in Corner  - 1 x daily - 7 x weekly - 1 sets - 2-3 reps -  30s hold - Supine Bridge  - 1 x daily - 7 x weekly - 2 sets - 12 reps - Standing Hip Abduction with Resistance at Ankles and Counter Support  - 1 x daily - 7 x weekly - 2 sets - 10 reps - Standing Hip Extension with Resistance at Ankles and Counter Support  - 1 x daily - 7 x weekly - 2 sets - 10 reps - Single Leg Stance  - 1 x daily - 7 x weekly - 1 sets - 3 reps - 5-15s hold - Standing Shoulder External Rotation with Resistance  - 1 x daily - 7 x weekly - 2 sets - 10 reps - Seated Shoulder Horizontal Abduction with Resistance  - 1 x daily - 7 x weekly - 2 sets - 10 reps       GOALS: Goals reviewed with patient? Yes   SHORT TERM GOALS: Target date: 10/18/2021   The patient will demonstrate knowledge of basic self care strategies and exercises for improved strength and mobility Baseline: Goal status: In progress   2.  The patient will have an improved BERG balance score to  49/56 indicating reduced risk of falls  Baseline:  Goal status: INITIAL   3.  5x sit to stand test to 15.5 sec indicating improved LE strength  Baseline:  Goal status: INITIAL   4.  Able to ambulate 600 feet in 4 min  Baseline:  Goal status: INITIAL       LONG TERM GOALS: Target date: 11/15/2021   The patient will be independent with a safe self progressive HEP needed for long term  strengthening needed for home and community mobility Baseline:  Goal status: INITIAL   2.  BERG score improved to 51/56 indicating decreased fall risk Baseline:  Goal status: INITIAL   3.  Patient will complete 5x sit to stand in  14 sec less indicating improved LE muscular endurance for activity tolerance  Baseline:  Goal status: INITIAL   4.  Gait 700 feet in 6 minutes with perceived exertion of 12/20 Baseline:  Goal status: INITIAL   5.  The patient will have improved LE strength to at least 4+/5 needed for standing, walking longer distances and negotiating curbs at home and in the community  Baseline:  Goal status: INITIAL     ASSESSMENT:   CLINICAL IMPRESSION:  Patient reports pain at a level now where she can do some LE strengthening (low intensity).  No UE support for balance needed at the railing.  Therapist closely monitoring to ensure low reps to avoid an exacerbation of fibromyalgia symptoms.      OBJECTIVE IMPAIRMENTS decreased activity tolerance, decreased endurance, difficulty walking, decreased strength, and impaired perceived functional ability.    ACTIVITY LIMITATIONS cleaning, meal prep, laundry, and shopping.    PERSONAL FACTORS Age and 1 comorbidity: fibromyalgia  are also affecting patient's functional outcome.      REHAB POTENTIAL: Good   CLINICAL DECISION MAKING: Stable/uncomplicated   EVALUATION COMPLEXITY: Low   PLAN: PT FREQUENCY: 2x/week   PT DURATION: 8 weeks   PLANNED INTERVENTIONS: Therapeutic exercises, Therapeutic activity, Neuromuscular re-education, Balance training, Gait training, Patient/Family education, Joint mobilization, Aquatic Therapy, Moist heat, Taping, and Manual therapy   PLAN FOR NEXT SESSION:  needs low intensity exercise secondary to reports of flare ups prefers ice instead of heat;   Addaday;  low level balance in small doses (fibroymyalgia);  single leg/narrow base of support; Nu-Step;  general LE strengthening,. Test 5x  sit  to stand and 2 min walk test.   Ruben Im, PT 10/23/21 3:27 PM Phone: [phone] Fax: [phone]  [facility] [address] [place] [address] [place], [place] [address] Phone # [phone] Fax [phone]

## 2021-10-25 ENCOUNTER — Encounter: Payer: Medicare HMO | Admitting: Physical Therapy

## 2021-10-29 ENCOUNTER — Ambulatory Visit: Payer: Medicare HMO | Attending: Family Medicine

## 2021-10-29 DIAGNOSIS — M6281 Muscle weakness (generalized): Secondary | ICD-10-CM | POA: Insufficient documentation

## 2021-10-29 DIAGNOSIS — R2681 Unsteadiness on feet: Secondary | ICD-10-CM | POA: Insufficient documentation

## 2021-10-29 NOTE — Therapy (Signed)
OUTPATIENT PHYSICAL THERAPY TREATMENT NOTE   Patient Name: Belinda Mcdowell MRN: [id number] DOB:03-20-1941, 81 y.o., female Today's Date: 10/29/2021  PCP: Dr. Lujean Amel REFERRING PROVIDER: Dr. Lujean Amel  END OF SESSION:   PT End of Session - 10/04/21 1607     Visit Number 5   Date for PT Re-Evaluation 11/15/21    Authorization Type [employer]    Progress Note Due on Visit 10    PT Start Time 1456 late   PT Stop Time 1530 ice    PT Time Calculation (min) 34 min    Activity Tolerance Patient tolerated treatment well             Past Medical History:  Diagnosis Date   Abnormal SPEP 02/21/2015   Allergic rhinitis, cause unspecified    Allergy    states she has environmental allergies; and takes zyrtec   Anemia, unspecified 11/18/2011   Prior on aranesp   Anxiety    Arthritis    Chronic fatigue 02/21/2015   Chronic headache disorder 02/21/2015   Degenerative joint disease 11/18/2011   diffuse   Depression    Fibromyalgia 11/18/2011   Generalized headaches    Hyperlipidemia    Increased urinary frequency 02/21/2015   Insomnia    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   Abnormal SPEP 02/21/2015   Chronic headache  disorder 02/21/2015   Increased urinary frequency 02/21/2015   Chronic fatigue 02/21/2015   Mouth pain 12/02/2014   General weakness 12/02/2014   Dysuria 04/19/2014   Right flank pain 04/19/2014   Urinary frequency 12/10/2013   Essential hypertension 03/19/2013   Preventative health care 12/08/2012   Conjunctivitis, allergic 06/10/2012   Exercise intolerance 02/25/2012   History of non-Hodgkin's lymphoma 11/18/2011   Fibromyalgia 11/18/2011   Mucous retention cyst of maxillary sinus 11/18/2011   Psoriasis 11/18/2011   Anemia, unspecified 11/18/2011   PUD (peptic ulcer disease) 11/18/2011   Degenerative joint disease 11/18/2011   Lumbar disc disease 11/18/2011   Allergic rhinitis    Depression    Insomnia    Hyperlipidemia    Anxiety    Palpitation-Previously thought to be atrial fibrillation now with Holter data to suggest PVCs     REFERRING DIAG: deconditioning; weakness  THERAPY DIAG:  Muscle weakness (generalized)  Unsteadiness on feet  PERTINENT HISTORY: fibromyalgia; history of cancer  PRECAUTIONS: fibromyalgia   SUBJECTIVE: I feel 80% better since the start of care.  My back pain is better overall.   PAIN:  Are you having pain? PAIN:  Are you having pain? Yes NPRS scale: 6/10 Pain location: Rt back and buttock Ice helps to  reduce the pain   OBJECTIVE: (objective measures completed at initial evaluation unless otherwise dated)  09/20/21 DIAGNOSTIC FINDINGS: none   COGNITION: Overall cognitive status: Within functional limits for tasks assessed               POSTURE: rounded shoulders and forward head   LE ROM:   WFLs   MMT:  LEs grossly 4/5 except hip abductors 4-/5 bil;  able to rise from chair without UE assist         Comments: Loss of balance with putting on rain coat, min assist to stabilize   FUNCTIONAL TESTs: no cane 5 times sit to stand: 16 Timed up and go (TUG): to be done next visit  2 minute walk test: 3 min walk test 12/20  Perceived exertion 500 feet Berg Balance Scale: 46/56  10/29/21: 5x sit to stand: 14.3 seconds  4 min walk test: 648 feet       TODAY'S TREATMENT:  10/29/21: Seated core: 4# plyo ball: hip to hip, shoulder to hip, Seated heel raise with 4# ball resting on thigh 10x right/left Standing at the bar: side to side weight shift; front back weight shift 10x each Standing at the bar: side stepping Manual: In Lt sidelying with Rt LE supported;soft tissue/MFR mobilization with Addaday  Ice pack to low back 10 min for pain control 5/30: Seated core: 4# plyo ball: hip to hip, shoulder to hip, Seated green loop: hip flexion 10x and knee extension  10x; HS curls 10x each side Seated green band clams 15x; Seated heel raise with 4# ball resting on thigh 10x right/left Standing at the bar: side to side weight shift; front back weight shift 10x each Standing at the bar: side stepping Manual: In Lt sidelying with Rt LE supported;soft tissue/MFR mobilization with Addaday  Ice pack to low back 10 min for pain control  5/22: Ball squeeze with TA contraction 3 sec hold 10x in hooklying Seated yellow 4# ball reach down and pull up 10x Green band clams submax isometrics 10x supine in hooklying Seated core: 4# plyo ball: hip to hip, shoulder to hip, rock to baby, ear to ear 10x each Hooklying: TA activation with marching x10 each Manual: In Lt sidelying with Rt LE supported;soft tissue/MFR mobilization with Addaday  Ice pack to low back 10 min for pain control     PATIENT EDUCATION: Education details: Access Code: [email or web address] Person educated: Patient Education method: Explanation, demo Education comprehension: verbalized understanding, return demo     HOME EXERCISE PROGRAM: From prior session of PT last year:  Access Code: [email or web address] URL: [email or web address] Date: 09/26/2021 Prepared by: Claiborne Billings  Exercises - Standing Terminal Knee Extension at [employer] with Diona Foley  - 1 x daily - 7 x weekly -  2 sets - 12 reps - Sit to Stand Without Arm Support  - 1 x daily - 7 x weekly - 2 sets - 10 reps - Tandem Stance in Corner  - 1 x daily - 7 x weekly - 1 sets - 2-3 reps - 30s hold - Supine Bridge  - 1 x daily - 7 x weekly - 2 sets - 12 reps - Standing Hip Abduction with Resistance at Ankles and Counter Support  - 1 x daily - 7 x weekly - 2 sets - 10 reps - Standing Hip Extension with Resistance at Ankles and Counter Support  - 1 x daily - 7 x weekly - 2 sets - 10 reps - Single Leg Stance  - 1  x daily - 7 x weekly - 1 sets - 3 reps - 5-15s hold - Standing Shoulder External Rotation with Resistance  - 1 x daily - 7 x weekly - 2 sets - 10 reps - Seated Shoulder Horizontal Abduction with Resistance  - 1 x daily - 7 x weekly - 2 sets - 10 reps       GOALS: Goals reviewed with patient? Yes   SHORT TERM GOALS: Target date: 10/18/2021   The patient will demonstrate knowledge of basic self care strategies and exercises for improved strength and mobility Baseline: Goal status: MET (10/29/21)   2.  The patient will have an improved BERG balance score to  49/56 indicating reduced risk of falls  Baseline:  Goal status: INITIAL   3.  5x sit to stand test to 15.5 sec indicating improved LE strength  Baseline: 14.3 seconds (10/29/21) Goal status: In progress   4.  Able to ambulate 600 feet in 4 min  Baseline: 648 feet (10/29/21) Goal status: MET       LONG TERM GOALS: Target date: 11/15/2021   The patient will be independent with a safe self progressive HEP needed for long term strengthening needed for home and community mobility Baseline:  Goal status: INITIAL   2.  BERG score improved to 51/56 indicating decreased fall risk Baseline:  Goal status: INITIAL   3.  Patient will complete 5x sit to stand in  14 sec less indicating improved LE muscular endurance for activity tolerance  Baseline:  Goal status: INITIAL   4.  Gait 700 feet in 6 minutes with perceived exertion of 12/20 Baseline:   Goal status: INITIAL   5.  The patient will have improved LE strength to at least 4+/5 needed for standing, walking longer distances and negotiating curbs at home and in the community  Baseline:  Goal status: INITIAL     ASSESSMENT:   CLINICAL IMPRESSION:  Pt reports 80% reduction in pain since the start of care.  Pt continues to be able to perform low to moderate level exercises in the clinic.  Patient reports pain at a level now where she can do some LE strengthening (low intensity). 5x sit to stand in improved to 14.3 seconds without UE support. Pt walked 648 feet in 4 minutes, meeting goal.  Fewer LE exercises done today due to walking.  No UE support for balance needed at the railing.  Therapist closely monitoring to ensure low reps to avoid an exacerbation of fibromyalgia symptoms.      OBJECTIVE IMPAIRMENTS decreased activity tolerance, decreased endurance, difficulty walking, decreased strength, and impaired perceived functional ability.    ACTIVITY LIMITATIONS cleaning, meal prep, laundry, and shopping.    PERSONAL FACTORS Age and 1 comorbidity: fibromyalgia  are also affecting patient's functional outcome.      REHAB POTENTIAL: Good   CLINICAL DECISION MAKING: Stable/uncomplicated   EVALUATION COMPLEXITY: Low   PLAN: PT FREQUENCY: 2x/week   PT DURATION: 8 weeks   PLANNED INTERVENTIONS: Therapeutic exercises, Therapeutic activity, Neuromuscular re-education, Balance training, Gait training, Patient/Family education, Joint mobilization, Aquatic Therapy, Moist heat, Taping, and Manual therapy   PLAN FOR NEXT SESSION:  needs low intensity exercise secondary to reports of flare ups   Addaday;  low level balance in small doses (fibroymyalgia);  single leg/narrow base of support; Nu-Step;  general LE strengthening. Test [name] to address STG  Sigurd Sos, PT 10/29/21 4:18 PM   [facility] [address] [place], [place]  [place] Phone #  [phone] Fax [phone]

## 2021-11-01 ENCOUNTER — Ambulatory Visit: Payer: Medicare HMO | Admitting: Physical Therapy

## 2021-11-04 NOTE — Therapy (Unsigned)
OUTPATIENT PHYSICAL THERAPY TREATMENT NOTE   Patient Name: Belinda Mcdowell MRN: [id number] DOB:1941-04-06, 81 y.o., female Today's Date: 11/05/2021  PCP: Dr. Lujean Amel REFERRING PROVIDER: Dr. Lujean Amel  END OF SESSION:   PT End of Session - 10/04/21 1607     Visit Number 5   Date for PT Re-Evaluation 11/15/21    Authorization Type Aet[employer]    Progress Note Due on Visit 10    PT Start Time 1456 late   PT Stop Time 1530 ice    PT Time Calculation (min) 34 min    Activity Tolerance Patient tolerated treatment well             Past Medical History:  Diagnosis Date   Abnormal SPEP 02/21/2015   Allergic rhinitis, cause unspecified    Allergy    states she has environmental allergies; and takes zyrtec   Anemia, unspecified 11/18/2011   Prior on aranesp   Anxiety    Arthritis    Chronic fatigue 02/21/2015   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   Abnormal SPEP 02/21/2015   Chronic headache  disorder 02/21/2015   Increased urinary frequency 02/21/2015   Chronic fatigue 02/21/2015   Mouth pain 12/02/2014   General weakness 12/02/2014   Dysuria 04/19/2014   Right flank pain 04/19/2014   Urinary frequency 12/10/2013   Essential hypertension 03/19/2013   Preventative health care 12/08/2012   Conjunctivitis, allergic 06/10/2012   Exercise intolerance 02/25/2012   History of non-Hodgkin's lymphoma 11/18/2011   Fibromyalgia 11/18/2011   Mucous retention cyst of maxillary sinus 11/18/2011   Psoriasis 11/18/2011   Anemia, unspecified 11/18/2011   PUD (peptic ulcer disease) 11/18/2011   Degenerative joint disease 11/18/2011   Lumbar disc disease 11/18/2011   Allergic rhinitis    Depression    Insomnia    Hyperlipidemia    Anxiety    Palpitation-Previously thought to be atrial fibrillation now with Holter data to suggest PVCs     REFERRING DIAG: deconditioning; weakness  THERAPY DIAG:  Unsteadiness on feet  Muscle weakness (generalized)  PERTINENT HISTORY: fibromyalgia; history of cancer  PRECAUTIONS: fibromyalgia   SUBJECTIVE: The gradual exercise is what I need and the ice and vibrations are also very helpful.  PAIN:  Are you having pain? PAIN:  Are you having pain? Yes NPRS scale: 7/10 Pain location: Rt back and buttock Ice helps to reduce  the pain   OBJECTIVE: (objective measures completed at initial evaluation unless otherwise dated)  09/20/21 DIAGNOSTIC FINDINGS: none   COGNITION: Overall cognitive status: Within functional limits for tasks assessed               POSTURE: rounded shoulders and forward head   LE ROM:   WFLs   MMT:  LEs grossly 4/5 except hip abductors 4-/5 bil;  able to rise from chair without UE assist         Comments: Loss of balance with putting on rain coat, min assist to stabilize   FUNCTIONAL TESTs: no cane 5 times sit to stand: 16 Timed up and go (TUG): to be done next visit  2 minute walk test: 3 min walk test  12/20 Perceived exertion 500 feet Berg Balance Scale: 46/56  10/29/21: 5x sit to stand: 14.3 seconds  4 min walk test: 648 feet       TODAY'S TREATMENT:   11/05/21: Seated core: 4# plyo ball: hip to hip, shoulder to hip, ear to ear Seated heel raise with 4# ball resting on thigh 10x right/left Standing at the bar: side to side weight shift; front back weight shift 10x each Marching at [employer] 10x Standing at the bar: side stepping 10x with green loop In Lt sidelying with Rt LE supported;soft tissue/MFR mobilization with Addaday to gluteals, hamstrings and proximal quads Ice post to back in hooklying  10/29/21: Seated core: 4# plyo ball: hip to hip, shoulder to hip, Seated heel raise with 4# ball resting on thigh 10x right/left Standing at the bar: side to side weight shift; front back weight shift 10x each Standing at the bar: side stepping Manual: In Lt sidelying with Rt LE supported;soft tissue/MFR mobilization with Addaday  Ice pack to low back 10 min for pain control 5/30: Seated core: 4# plyo ball: hip to hip, shoulder to hip, Seated green loop: hip flexion 10x and knee extension  10x; HS curls 10x each side Seated green band clams 15x; Seated heel raise with 4# ball resting on thigh 10x right/left Standing at the bar: side to side weight shift; front back weight shift 10x each Standing at the bar: side stepping Manual: In Lt sidelying with Rt LE supported;soft tissue/MFR mobilization with Addaday  Ice pack to low back 10 min for pain contr     PATIENT EDUCATION: Education details: Access Code: [email or web address] Person educated: Patient Education method: Explanation, demo Education comprehension: verbalized understanding, return demo     [place]: From prior session of PT last year:  Access Code: [email or web address] URL: [email or web address] Date: 09/26/2021 Prepared by: Claiborne Billings  Exercises - Standing Terminal Knee Extension at [employer] with Diona Foley  - 1 x daily - 7 x  weekly - 2 sets - 12 reps - Sit to Stand Without Arm Support  - 1 x daily - 7 x weekly - 2 sets - 10 reps - Tandem Stance in Corner  - 1 x daily - 7 x weekly - 1 sets - 2-3 reps - 30s hold - Supine Bridge  - 1 x daily - 7 x weekly - 2 sets - 12 reps - Standing Hip Abduction with Resistance at Ankles and Counter Support  - 1 x daily - 7 x weekly - 2 sets - 10 reps - Standing Hip Extension with Resistance at Ankles and Counter Support  - 1 x daily - 7 x weekly - 2 sets - 10 reps - Single Leg Stance  - 1 x daily -  7 x weekly - 1 sets - 3 reps - 5-15s hold - Standing Shoulder External Rotation with Resistance  - 1 x daily - 7 x weekly - 2 sets - 10 reps - Seated Shoulder Horizontal Abduction with Resistance  - 1 x daily - 7 x weekly - 2 sets - 10 reps       GOALS: Goals reviewed with patient? Yes   SHORT TERM GOALS: Target date: 10/18/2021   The patient will demonstrate knowledge of basic self care strategies and exercises for improved strength and mobility Baseline: Goal status: MET (10/29/21)   2.  The patient will have an improved BERG balance score to  49/56 indicating reduced risk of falls  Baseline:  Goal status: INITIAL   3.  5x sit to stand test to 15.5 sec indicating improved LE strength  Baseline: 14.3 seconds (10/29/21) Goal status: In progress   4.  Able to ambulate 600 feet in 4 min  Baseline: 648 feet (10/29/21) Goal status: MET       LONG TERM GOALS: Target date: 11/15/2021   The patient will be independent with a safe self progressive HEP needed for long term strengthening needed for home and community mobility Baseline:  Goal status: INITIAL   2.  BERG score improved to 51/56 indicating decreased fall risk Baseline:  Goal status: INITIAL   3.  Patient will complete 5x sit to stand in  14 sec less indicating improved LE muscular endurance for activity tolerance  Baseline:  Goal status: INITIAL   4.  Gait 700 feet in 6 minutes with perceived exertion of  12/20 Baseline:  Goal status: INITIAL   5.  The patient will have improved LE strength to at least 4+/5 needed for standing, walking longer distances and negotiating curbs at home and in the community  Baseline:  Goal status: INITIAL     ASSESSMENT:   CLINICAL IMPRESSION:  Pt arrives with moderate pain in her Lt low back and gluteal area. Pt was able to add just a few new exercises to keep with the gradual nature of her Rx. No pain with TE today. Pt continues to report feeling "best" after the soft tissue mobilization and ice. This gives relief for just greater than 24 hours, maybe more.    OBJECTIVE IMPAIRMENTS decreased activity tolerance, decreased endurance, difficulty walking, decreased strength, and impaired perceived functional ability.    ACTIVITY LIMITATIONS cleaning, meal prep, laundry, and shopping.    PERSONAL FACTORS Age and 1 comorbidity: fibromyalgia  are also affecting patient's functional outcome.      REHAB POTENTIAL: Good   CLINICAL DECISION MAKING: Stable/uncomplicated   EVALUATION COMPLEXITY: Low   PLAN: PT FREQUENCY: 2x/week   PT DURATION: 8 weeks   PLANNED INTERVENTIONS: Therapeutic exercises, Therapeutic activity, Neuromuscular re-education, Balance training, Gait training, Patient/Family education, Joint mobilization, Aquatic Therapy, Moist heat, Taping, and Manual therapy   PLAN FOR NEXT SESSION:  needs low intensity exercise secondary to reports of flare ups   Addaday;  low level balance in small doses (fibroymyalgia);  single leg/narrow base of support; Nu-Step;  general LE strengthening. Test [name] to address STG  Myrene Galas, PTA 11/05/21 4:08 PM   [facility] [address], [place], [place] [address] Phone # [phone] Fax [phone]

## 2021-11-05 ENCOUNTER — Encounter: Payer: Self-pay | Admitting: Physical Therapy

## 2021-11-05 ENCOUNTER — Ambulatory Visit: Payer: Medicare HMO | Admitting: Physical Therapy

## 2021-11-05 DIAGNOSIS — R2681 Unsteadiness on feet: Secondary | ICD-10-CM

## 2021-11-05 DIAGNOSIS — M6281 Muscle weakness (generalized): Secondary | ICD-10-CM

## 2021-11-12 ENCOUNTER — Ambulatory Visit: Payer: Medicare HMO

## 2021-11-12 DIAGNOSIS — R2681 Unsteadiness on feet: Secondary | ICD-10-CM

## 2021-11-12 DIAGNOSIS — M6281 Muscle weakness (generalized): Secondary | ICD-10-CM | POA: Diagnosis not present

## 2021-11-12 NOTE — Therapy (Signed)
OUTPATIENT PHYSICAL THERAPY TREATMENT NOTE   Patient Name: Belinda Mcdowell MRN: [id number] DOB:September 17, 1940, 81 y.o., female Today's Date: 11/12/2021  PCP: Dr. Lujean Amel REFERRING PROVIDER: Dr. Lujean Amel Progress Note Reporting Period 09/20/21 to 11/12/21  See note below for Objective Data and Assessment of Progress/Goals.     END OF SESSION:   PT End of Session - 11/12/21 1653     Visit Number 10    Date for PT Re-Evaluation 11/15/21    Authorization Type [employer]    Progress Note Due on Visit 10    PT Start Time 1614    PT Stop Time 1704    PT Time Calculation (min) 50 min    Activity Tolerance Patient tolerated treatment well    Behavior During Therapy WFL for tasks assessed/performed              Past Medical History:  Diagnosis Date   Abnormal SPEP 02/21/2015   Allergic rhinitis, cause unspecified    Allergy    states she has environmental allergies; and takes zyrtec   Anemia, unspecified 11/18/2011   Prior on aranesp   Anxiety    Arthritis    Chronic fatigue 02/21/2015   Chronic headache disorder 02/21/2015   Degenerative joint disease 11/18/2011   diffuse   Depression    Fibromyalgia 11/18/2011   Generalized headaches    Hyperlipidemia    Increased urinary frequency 02/21/2015   Insomnia    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   Abnormal SPEP 02/21/2015   Chronic headache disorder 02/21/2015   Increased urinary frequency 02/21/2015   Chronic fatigue 02/21/2015   Mouth pain 12/02/2014   General weakness 12/02/2014   Dysuria 04/19/2014   Right flank pain 04/19/2014   Urinary frequency 12/10/2013   Essential hypertension 03/19/2013   Preventative health care 12/08/2012   Conjunctivitis, allergic 06/10/2012   Exercise intolerance 02/25/2012   History of non-Hodgkin's lymphoma 11/18/2011   Fibromyalgia 11/18/2011   Mucous retention cyst of maxillary sinus 11/18/2011   Psoriasis 11/18/2011   Anemia, unspecified 11/18/2011   PUD (peptic ulcer disease) 11/18/2011   Degenerative joint disease 11/18/2011   Lumbar disc disease 11/18/2011   Allergic rhinitis    Depression    Insomnia    Hyperlipidemia    Anxiety    Palpitation-Previously thought to be atrial fibrillation now with Holter data to suggest PVCs     REFERRING DIAG: deconditioning; weakness  THERAPY DIAG:  Unsteadiness on feet  Muscle weakness (generalized)  PERTINENT HISTORY: fibromyalgia; history of cancer  PRECAUTIONS: fibromyalgia   SUBJECTIVE: The hip is still bothering me but I feel like I'm  stronger.    PAIN:  Are you having pain? PAIN:  Are you having pain? Yes NPRS scale: 7/10 Pain location: Rt back and buttock Ice helps to reduce the pain   OBJECTIVE: (objective measures completed at initial evaluation unless otherwise dated)  09/20/21 DIAGNOSTIC FINDINGS: none   COGNITION: Overall cognitive status: Within functional limits for tasks assessed               POSTURE: rounded shoulders and forward head   LE ROM:   WFLs   MMT:  LEs grossly 4/5 except hip abductors 4-/5 bil;  able to rise from chair without UE assist      Comments: Loss of balance with putting on rain coat, min assist to  stabilize   FUNCTIONAL TESTs: no cane 5 times sit to stand: 16 Timed up and go (TUG): to be done next visit  2 minute walk test: 3 min walk test 12/20 Perceived exertion 500 feet Berg Balance Scale: 46/56  10/29/21: 5x sit to stand: 14.3 seconds  4 min walk test: 648 feet  11/12/21:  [name] Balance:   50/56-      TODAY'S TREATMENT:  11/12/21: [name] Balance test performed- see above score Seated core: 4# plyo ball: hip to hip, shoulder to hip, ear to ear Seated heel raise with 4# ball resting on thigh 10x right/left Standing at the bar: side to side weight shift; front back weight shift-standing on balance pad x 1 min each Marching at [employer] 10x  Manual: In Lt sidelying with Rt LE supported;soft tissue/MFR mobilization with Addaday  Ice pack to low back 10 min for pain control  11/05/21: Seated core: 4# plyo ball: hip to hip, shoulder to hip, ear to ear Seated heel raise with 4# ball resting on thigh 10x right/left Standing at the bar: side to side weight shift; front back weight shift 10x each Marching at [employer] 10x Standing at the bar: side stepping 10x with green loop In Lt sidelying with Rt LE supported;soft tissue/MFR mobilization with Addaday to gluteals, hamstrings and proximal quads Ice post to back in hooklying  10/29/21: Seated core: 4# plyo ball: hip to hip, shoulder to hip, Seated heel raise with 4# ball resting on thigh 10x right/left Standing at the bar: side to side weight shift; front back weight shift 10x each Standing at the bar: side stepping Manual: In Lt sidelying with Rt LE supported;soft tissue/MFR mobilization with Addaday  Ice pack to low back 10 min for pain control  PATIENT EDUCATION: Education details: Access Code: [email or web address] Person educated: Patient Education method: Explanation, demo Education comprehension: verbalized understanding, return demo     HOME EXERCISE PROGRAM: From prior session of PT last year:  Access Code: [email or web address] URL:  [email or web address] Date: 09/26/2021 Prepared by: Claiborne Billings  Exercises - Standing Terminal Knee Extension at [employer] with Diona Foley  - 1 x daily - 7 x weekly - 2 sets - 12 reps - Sit to Stand Without Arm Support  - 1 x daily - 7 x weekly - 2 sets - 10 reps - Tandem Stance in Corner  - 1 x daily - 7 x weekly - 1 sets - 2-3 reps - 30s hold - Supine Bridge  - 1 x daily - 7 x weekly - 2 sets - 12 reps - Standing Hip Abduction with Resistance at Ankles and Counter Support  - 1 x daily - 7 x weekly - 2 sets - 10 reps - Standing Hip Extension with Resistance at Ankles and Counter Support  -  1 x daily - 7 x weekly - 2 sets - 10 reps - Single Leg Stance  - 1 x daily - 7 x weekly - 1 sets - 3 reps - 5-15s hold - Standing Shoulder External Rotation with Resistance  - 1 x daily - 7 x weekly - 2 sets - 10 reps - Seated Shoulder Horizontal Abduction with Resistance  - 1 x daily - 7 x weekly - 2 sets - 10 reps       GOALS: Goals reviewed with patient? Yes   SHORT TERM GOALS: Target date: 10/18/2021   The patient will demonstrate knowledge of basic self care strategies and exercises for improved strength and mobility Baseline: Goal status: MET (10/29/21)   2.  The patient will have an improved BERG balance score to  49/56 indicating reduced risk of falls  Baseline:  50/56 (11/12/21) Goal status: MET   3.  5x sit to stand test to 15.5 sec indicating improved LE strength  Baseline: 14.3 seconds (10/29/21) Goal status: In progress   4.  Able to ambulate 600 feet in 4 min  Baseline: 648 feet (10/29/21) Goal status: MET       LONG TERM GOALS: Target date: 11/15/2021   The patient will be independent with a safe self progressive HEP needed for long term strengthening needed for home and community mobility Baseline:  Goal status: INITIAL   2.  BERG score improved to 51/56 indicating decreased fall risk Baseline:  Goal status: INITIAL   3.  Patient will complete 5x sit to stand in  14 sec less  indicating improved LE muscular endurance for activity tolerance  Baseline:  Goal status: INITIAL   4.  Gait 700 feet in 6 minutes with perceived exertion of 12/20 Baseline:  Goal status: INITIAL   5.  The patient will have improved LE strength to at least 4+/5 needed for standing, walking longer distances and negotiating curbs at home and in the community  Baseline:  Goal status: INITIAL     ASSESSMENT:   CLINICAL IMPRESSION:  Pt arrives with continued report of 7/10 Rt gluteal and low back pain.  Pt continues to do well with gentle strength and progression of balance exercises.  PT monitored pt for safety and pain throughout session.  Berg score is improved to 50/56 indicating lower falls risk since the start of care.  Pt with Rt gluteal and lumbar pain and has pain relief after session when she receives Addaday and ice at the end of the session.  Patient will benefit from skilled PT to address the below impairments and improve overall function.    OBJECTIVE IMPAIRMENTS decreased activity tolerance, decreased endurance, difficulty walking, decreased strength, and impaired perceived functional ability.    ACTIVITY LIMITATIONS cleaning, meal prep, laundry, and shopping.    PERSONAL FACTORS Age and 1 comorbidity: fibromyalgia  are also affecting patient's functional outcome.      REHAB POTENTIAL: Good   CLINICAL DECISION MAKING: Stable/uncomplicated   EVALUATION COMPLEXITY: Low   PLAN: PT FREQUENCY: 2x/week   PT DURATION: 8 weeks   PLANNED INTERVENTIONS: Therapeutic exercises, Therapeutic activity, Neuromuscular re-education, Balance training, Gait training, Patient/Family education, Joint mobilization, Aquatic Therapy, Moist heat, Taping, and Manual therapy   PLAN FOR NEXT SESSION:  needs low intensity exercise secondary to reports of flare ups   Addaday and ice at end of session.  ERO needed next session.  Pt has already scheduled additional appts   Sigurd Sos, PT 11/12/21  4:56 PM  [facility] [address] [place], [place] [address] Phone # [phone] Fax [phone]

## 2021-11-15 ENCOUNTER — Ambulatory Visit: Payer: Medicare HMO | Admitting: Physical Therapy

## 2021-11-15 DIAGNOSIS — R2681 Unsteadiness on feet: Secondary | ICD-10-CM | POA: Diagnosis not present

## 2021-11-15 DIAGNOSIS — M6281 Muscle weakness (generalized): Secondary | ICD-10-CM | POA: Diagnosis not present

## 2021-11-15 NOTE — Therapy (Signed)
OUTPATIENT PHYSICAL THERAPY TREATMENT NOTE/Recertification   Patient Name: Belinda Mcdowell MRN: [id number] DOB:14-Jan-1941, 81 y.o., female Today's Date: 11/15/2021  PCP: Dr. Lujean Amel REFERRING PROVIDER: Dr. Lujean Amel    END OF SESSION:   PT End of Session - 11/15/21 1530     Visit Number 11    Date for PT Re-Evaluation 01/10/22    Authorization Type Aet[employer]    Progress Note Due on Visit 37    PT Start Time 1531    PT Stop Time 1612    PT Time Calculation (min) 41 min    Activity Tolerance Patient tolerated treatment well              Past Medical History:  Diagnosis Date   Abnormal SPEP 02/21/2015   Allergic rhinitis, cause unspecified    Allergy    states she has environmental allergies; and takes zyrtec   Anemia, unspecified 11/18/2011   Prior on aranesp   Anxiety    Arthritis    Chronic fatigue 02/21/2015   Chronic headache disorder 02/21/2015   Degenerative joint disease 11/18/2011   diffuse   Depression    Fibromyalgia 11/18/2011   Generalized headaches    Hyperlipidemia    Increased urinary frequency 02/21/2015   Insomnia    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   Abnormal SPEP 02/21/2015   Chronic  headache disorder 02/21/2015   Increased urinary frequency 02/21/2015   Chronic fatigue 02/21/2015   Mouth pain 12/02/2014   General weakness 12/02/2014   Dysuria 04/19/2014   Right flank pain 04/19/2014   Urinary frequency 12/10/2013   Essential hypertension 03/19/2013   Preventative health care 12/08/2012   Conjunctivitis, allergic 06/10/2012   Exercise intolerance 02/25/2012   History of non-Hodgkin's lymphoma 11/18/2011   Fibromyalgia 11/18/2011   Mucous retention cyst of maxillary sinus 11/18/2011   Psoriasis 11/18/2011   Anemia, unspecified 11/18/2011   PUD (peptic ulcer disease) 11/18/2011   Degenerative joint disease 11/18/2011   Lumbar disc disease 11/18/2011   Allergic rhinitis    Depression    Insomnia    Hyperlipidemia    Anxiety    Palpitation-Previously thought to be atrial fibrillation now with Holter data to suggest PVCs     REFERRING DIAG: deconditioning; weakness  THERAPY DIAG:  Unsteadiness on feet  Muscle weakness (generalized)  PERTINENT HISTORY: fibromyalgia; history of cancer  PRECAUTIONS: fibromyalgia   SUBJECTIVE: I'm much better.  No cane today. I can't believe I can walk without the cane! The Addaday and ice help so much no pain even the next day.  PAIN:  Are you having pain? PAIN:  Are you  having pain? Yes NPRS scale: 6/10 Pain location: Rt back and buttock Ice helps to reduce the pain   OBJECTIVE: (objective measures completed at initial evaluation unless otherwise dated)  09/20/21 DIAGNOSTIC FINDINGS: none   COGNITION: Overall cognitive status: Within functional limits for tasks assessed               POSTURE: rounded shoulders and forward head   LE ROM:   WFLs   MMT:  LEs grossly 4/5 except hip abductors 4-/5 bil;  able to rise from chair without UE assist   6/22: hip abductors and trunk/core muscles 4/5    Comments: Loss of balance with putting on rain coat, min assist to stabilize   FUNCTIONAL TESTs: no cane 5 times  sit to stand: 16  2 minute walk test: 3 min walk test 12/20 Perceived exertion 500 feet Berg Balance Scale: 46/56  10/29/21: 5x sit to stand: 14.3 seconds  4 min walk test: 648 feet  11/12/21:  Berg Balance:   50/56-   6/22:   5x sit to stand 13.49 sec no UE assist TUG: 12.86 sec no cane      TODAY'S TREATMENT:   6/22: 5x sit to stand TUG  Deferred longer walk since she doesn't have her cane and fears it would increase her pain Seated core: 4# plyo ball: hip to hip, shoulder to hip, ear to ear 15x Seated heel raise with 4# ball resting on thigh 10x right/left Standing at the bar: side to side weight shift; front back weight shift-standing on balance pad x 1 min each Marching at [employer] 10x  Manual: In prone soft tissue/MFR mobilization with Addaday  Ice pack to low back 10 min for pain control (prone)      11/12/21: [name] Balance test performed- see above score Seated core: 4# plyo ball: hip to hip, shoulder to hip, ear to ear Seated heel raise with 4# ball resting on thigh 10x right/left Standing at the bar: side to side weight shift; front back weight shift-standing on balance pad x 1 min each Marching at [employer] 10x  Manual: In Lt sidelying with Rt LE supported;soft tissue/MFR mobilization with Addaday  Ice pack to low back 10 min for pain control   PATIENT EDUCATION: Education details: Access Code: [email or web address] Person educated: Patient Education method: Explanation, demo Education comprehension: verbalized understanding, return demo     HOME EXERCISE PROGRAM: From prior session of PT last year:  Access Code: [email or web address] URL: [email or web address] Date: 09/26/2021 Prepared by: Claiborne Billings  Exercises - Standing Terminal Knee Extension at [employer] with Diona Foley  - 1 x daily - 7 x weekly - 2 sets - 12 reps - Sit to Stand Without Arm Support  - 1 x daily - 7 x weekly - 2 sets - 10 reps - Tandem Stance in Corner  - 1 x daily - 7 x weekly - 1 sets - 2-3 reps - 30s hold -  Supine Bridge  - 1 x daily - 7 x weekly - 2 sets - 12 reps - Standing Hip Abduction with Resistance at Ankles and Counter Support  - 1 x daily - 7 x weekly - 2 sets - 10 reps - Standing Hip Extension with Resistance at Ankles and Counter Support  - 1 x daily - 7 x weekly - 2 sets - 10 reps - Single Leg Stance  - 1 x daily - 7 x weekly - 1 sets - 3 reps - 5-15s hold - Standing Shoulder External Rotation with Resistance  -  1 x daily - 7 x weekly - 2 sets - 10 reps - Seated Shoulder Horizontal Abduction with Resistance  - 1 x daily - 7 x weekly - 2 sets - 10 reps       GOALS: Goals reviewed with patient? Yes   SHORT TERM GOALS: Target date: 10/18/2021   The patient will demonstrate knowledge of basic self care strategies and exercises for improved strength and mobility Baseline: Goal status: MET (10/29/21)   2.  The patient will have an improved BERG balance score to  49/56 indicating reduced risk of falls  Baseline:  50/56 (11/12/21) Goal status: MET   3.  5x sit to stand test to 15.5 sec indicating improved LE strength  Baseline: 14.3 seconds (10/29/21) Goal status: goal met 6/22   4.  Able to ambulate 600 feet in 4 min  Baseline: 648 feet (10/29/21) Goal status: MET       LONG TERM GOALS: Target date: 11/15/2021   The patient will be independent with a safe self progressive HEP needed for long term strengthening needed for home and community mobility Baseline:  Goal status: INITIAL   2.  BERG score improved to 51/56 indicating decreased fall risk Baseline:  Goal status: INITIAL   3.  Patient will complete 5x sit to stand in  14 sec less indicating improved LE muscular endurance for activity tolerance  Baseline:  Goal status: goal met 6/22   4.  Gait 700 feet in 6 minutes with perceived exertion of 12/20 Baseline:  Goal status: INITIAL   5.  The patient will have improved LE strength to at least 4+/5 needed for standing, walking longer distances and negotiating curbs at home  and in the community  Baseline:  Goal status: INITIAL 6. Timed up and Go improved to 12 sec or less indicating improved gait speed and agility     ASSESSMENT:   CLINICAL IMPRESSION:   The patient  is progressing with improvements in strength and balance.  We have had to make some modifications to intensity of exercise secondary to fibromyalgia with is easily exacerbated.  Improvements noted in 5x sit to stand test and decreased cane usage/dependency.  She is unable to tolerate extended walks secondary to pain flare up.  She would benefit from PT for a progression of balance ex challenge and strengthening to meet remaining long term goals.     OBJECTIVE IMPAIRMENTS decreased activity tolerance, decreased endurance, difficulty walking, decreased strength, and impaired perceived functional ability.    ACTIVITY LIMITATIONS cleaning, meal prep, laundry, and shopping.    PERSONAL FACTORS Age and 1 comorbidity: fibromyalgia  are also affecting patient's functional outcome.      REHAB POTENTIAL: Good   CLINICAL DECISION MAKING: Stable/uncomplicated   EVALUATION COMPLEXITY: Low   PLAN: PT FREQUENCY: 2x/week   PT DURATION: 8 weeks   PLANNED INTERVENTIONS: Therapeutic exercises, Therapeutic activity, Neuromuscular re-education, Balance training, Gait training, Patient/Family education, Joint mobilization, Aquatic Therapy, Moist heat, Taping, and Manual therapy   PLAN FOR NEXT SESSION:  needs low intensity exercise secondary to reports of flare ups  ; standing balance; LE and core strengthening; Addaday and ice at end of session for pain control   Ruben Im, PT 11/15/21 4:12 PM Phone: [phone] Fax: [place], [address], [place] [address] Phone # [phone] Fax [phone]

## 2021-11-19 ENCOUNTER — Ambulatory Visit: Payer: Medicare HMO | Admitting: Physical Therapy

## 2021-11-25 NOTE — Therapy (Deleted)
OUTPATIENT PHYSICAL THERAPY TREATMENT NOTE/Recertification   Patient Name: Belinda Mcdowell MRN: [id number] DOB:08/12/1940, 81 y.o., female Today's Date: 11/25/2021  PCP: Dr. Lujean Amel REFERRING PROVIDER: Dr. 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   Abnormal SPEP 02/21/2015   Chronic headache disorder 02/21/2015   Increased urinary frequency 02/21/2015   Chronic fatigue 02/21/2015   Mouth pain 12/02/2014   General weakness 12/02/2014   Dysuria 04/19/2014   Right flank pain 04/19/2014   Urinary frequency 12/10/2013   Essential hypertension 03/19/2013   Preventative health care  12/08/2012   Conjunctivitis, allergic 06/10/2012   Exercise intolerance 02/25/2012   History of non-Hodgkin's lymphoma 11/18/2011   Fibromyalgia 11/18/2011   Mucous retention cyst of maxillary sinus 11/18/2011   Psoriasis 11/18/2011   Anemia, unspecified 11/18/2011   PUD (peptic ulcer disease) 11/18/2011   Degenerative joint disease 11/18/2011   Lumbar disc disease 11/18/2011   Allergic rhinitis    Depression    Insomnia    Hyperlipidemia    Anxiety    Palpitation-Previously thought to be atrial fibrillation now with Holter data to suggest PVCs     REFERRING DIAG: deconditioning; weakness  THERAPY DIAG:  Muscle weakness (generalized)  Unsteadiness on feet  PERTINENT HISTORY: fibromyalgia; history of cancer  PRECAUTIONS: fibromyalgia   SUBJECTIVE: I'm much better.  No cane today. I can't believe I can walk without the cane! The Addaday and ice help so much no pain even the next day.  PAIN:  Are you having pain? PAIN:  Are you having pain? Yes NPRS scale: 6/10 Pain location: Rt back and buttock Ice helps to reduce the pain   OBJECTIVE: (objective measures completed at initial evaluation unless otherwise dated)  09/20/21 DIAGNOSTIC FINDINGS: none   COGNITION: Overall cognitive status: Within functional limits for tasks assessed               POSTURE: rounded shoulders and forward head   LE ROM:   WFLs   MMT:  LEs  grossly 4/5 except hip abductors 4-/5 bil;  able to rise from chair without UE assist   6/22: hip abductors and trunk/core muscles 4/5    Comments: Loss of balance with putting on rain coat, min assist to stabilize   FUNCTIONAL TESTs: no cane 5 times sit to stand: 16  2 minute walk test: 3 min walk test 12/20 Perceived exertion 500 feet Berg Balance Scale: 46/56  10/29/21: 5x sit to stand: 14.3 seconds  4 min walk test: 648 feet  11/12/21:  Berg Balance:   50/56-   6/22:   5x sit to stand 13.49 sec no UE assist TUG: 12.86 sec no cane       TODAY'S TREATMENT:   11/26/21:  6/22: 5x sit to stand TUG  Deferred longer walk since she doesn't have her cane and fears it would increase her pain Seated core: 4# plyo ball: hip to hip, shoulder to hip, ear to ear 15x Seated heel raise with 4# ball resting on thigh 10x right/left Standing at the bar: side to side weight shift; front back weight shift-standing on balance pad x 1 min each Marching at [employer] 10x  Manual: In prone soft tissue/MFR mobilization with Addaday  Ice pack to low back 10 min for pain control (prone)      11/12/21: [name] Balance test performed- see above score Seated core: 4# plyo ball: hip to hip, shoulder to hip, ear to ear Seated heel raise with 4# ball resting on thigh 10x right/left Standing at the bar: side to side weight shift; front back weight shift-standing on balance pad x 1 min each Marching at [employer] 10x  Manual: In Lt sidelying with Rt LE supported;soft tissue/MFR mobilization with Addaday  Ice pack to low back 10 min for pain control   PATIENT EDUCATION: Education details: Access Code: [email or web address] Person educated: Patient Education method: Explanation, demo Education comprehension: verbalized understanding, return demo     HOME EXERCISE PROGRAM: From prior session of PT last year:  Access Code: [email or web address] URL: [email or web address] Date: 09/26/2021 Prepared by: Claiborne Billings  Exercises - Standing Terminal Knee Extension at [employer] with Diona Foley  - 1 x daily - 7 x weekly - 2 sets - 12 reps - Sit to Stand Without Arm Support  - 1 x daily - 7 x weekly - 2 sets - 10 reps - Tandem Stance in Corner  - 1 x daily - 7 x weekly - 1 sets - 2-3 reps - 30s hold - Supine Bridge  - 1 x daily - 7 x weekly - 2 sets - 12 reps - Standing Hip Abduction with Resistance at Ankles and Counter Support  - 1 x daily - 7 x weekly - 2 sets - 10 reps - Standing Hip Extension with Resistance at Ankles and Counter Support  - 1 x daily - 7 x weekly - 2 sets - 10 reps -  Single Leg Stance  - 1 x daily - 7 x weekly - 1 sets - 3 reps - 5-15s hold - Standing Shoulder External Rotation with Resistance  - 1 x daily - 7 x weekly - 2 sets - 10 reps - Seated Shoulder Horizontal Abduction with Resistance  - 1 x daily - 7 x weekly - 2 sets - 10 reps       GOALS: Goals reviewed with patient? Yes   SHORT TERM GOALS: Target date: 10/18/2021   The patient will demonstrate knowledge of basic self care strategies and exercises for improved strength and mobility Baseline:  Goal status: MET (10/29/21)   2.  The patient will have an improved BERG balance score to  49/56 indicating reduced risk of falls  Baseline:  50/56 (11/12/21) Goal status: MET   3.  5x sit to stand test to 15.5 sec indicating improved LE strength  Baseline: 14.3 seconds (10/29/21) Goal status: goal met 6/22   4.  Able to ambulate 600 feet in 4 min  Baseline: 648 feet (10/29/21) Goal status: MET       LONG TERM GOALS: Target date: 11/15/2021   The patient will be independent with a safe self progressive HEP needed for long term strengthening needed for home and community mobility Baseline:  Goal status: INITIAL   2.  BERG score improved to 51/56 indicating decreased fall risk Baseline:  Goal status: INITIAL   3.  Patient will complete 5x sit to stand in  14 sec less indicating improved LE muscular endurance for activity tolerance  Baseline:  Goal status: goal met 6/22   4.  Gait 700 feet in 6 minutes with perceived exertion of 12/20 Baseline:  Goal status: INITIAL   5.  The patient will have improved LE strength to at least 4+/5 needed for standing, walking longer distances and negotiating curbs at home and in the community  Baseline:  Goal status: INITIAL 6. Timed up and Go improved to 12 sec or less indicating improved gait speed and agility     ASSESSMENT:   CLINICAL IMPRESSION:   The patient  is progressing with improvements in strength and balance.  We have had to make some  modifications to intensity of exercise secondary to fibromyalgia with is easily exacerbated.  Improvements noted in 5x sit to stand test and decreased cane usage/dependency.  She is unable to tolerate extended walks secondary to pain flare up.  She would benefit from PT for a progression of balance ex challenge and strengthening to meet remaining long term goals.     OBJECTIVE IMPAIRMENTS decreased activity tolerance, decreased endurance, difficulty walking, decreased strength, and impaired perceived functional ability.    ACTIVITY LIMITATIONS cleaning, meal prep, laundry, and shopping.    PERSONAL FACTORS Age and 1 comorbidity: fibromyalgia  are also affecting patient's functional outcome.      REHAB POTENTIAL: Good   CLINICAL DECISION MAKING: Stable/uncomplicated   EVALUATION COMPLEXITY: Low   PLAN: PT FREQUENCY: 2x/week   PT DURATION: 8 weeks   PLANNED INTERVENTIONS: Therapeutic exercises, Therapeutic activity, Neuromuscular re-education, Balance training, Gait training, Patient/Family education, Joint mobilization, Aquatic Therapy, Moist heat, Taping, and Manual therapy   PLAN FOR NEXT SESSION:  needs low intensity exercise secondary to reports of flare ups  ; standing balance; LE and core strengthening; Addaday and ice at end of session for pain control  Myrene Galas, PTA 11/25/21 6:55 PM   Phone: [phone] Fax: [facility] [address] [place], [place] [address] Phone # [phone] Fax [phone]

## 2021-11-26 ENCOUNTER — Ambulatory Visit: Payer: Medicare HMO | Admitting: Physical Therapy

## 2021-11-29 ENCOUNTER — Ambulatory Visit: Payer: Medicare HMO | Attending: Family Medicine | Admitting: Physical Therapy

## 2021-11-29 DIAGNOSIS — R2681 Unsteadiness on feet: Secondary | ICD-10-CM | POA: Insufficient documentation

## 2021-11-29 DIAGNOSIS — M6281 Muscle weakness (generalized): Secondary | ICD-10-CM | POA: Diagnosis not present

## 2021-11-29 NOTE — Therapy (Signed)
OUTPATIENT PHYSICAL THERAPY TREATMENT NOTE/Recertification   Patient Name: Belinda Mcdowell MRN: [id number] DOB:1941-01-10, 81 y.o., female Today's Date: 11/29/2021  PCP: Dr. Lujean Mcdowell REFERRING PROVIDER: Dr. Lujean Mcdowell    END OF SESSION:   PT End of Session - 11/29/21 1621     Visit Number 12    Date for PT Re-Evaluation 01/10/22    Authorization Type [employer]    Progress Note Due on Visit 20    PT Start Time 1617    PT Stop Time 1700    PT Time Calculation (min) 43 min    Activity Tolerance Patient tolerated treatment well              Past Medical History:  Diagnosis Date   Abnormal SPEP 02/21/2015   Allergic rhinitis, cause unspecified    Allergy    states she has environmental allergies; and takes zyrtec   Anemia, unspecified 11/18/2011   Prior on aranesp   Anxiety    Arthritis    Chronic fatigue 02/21/2015   Chronic headache disorder 02/21/2015   Degenerative joint disease 11/18/2011   diffuse   Depression    Fibromyalgia 11/18/2011   Generalized headaches    Hyperlipidemia    Increased urinary frequency 02/21/2015   Insomnia    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   Abnormal SPEP 02/21/2015   Chronic  headache disorder 02/21/2015   Increased urinary frequency 02/21/2015   Chronic fatigue 02/21/2015   Mouth pain 12/02/2014   General weakness 12/02/2014   Dysuria 04/19/2014   Right flank pain 04/19/2014   Urinary frequency 12/10/2013   Essential hypertension 03/19/2013   Preventative health care 12/08/2012   Conjunctivitis, allergic 06/10/2012   Exercise intolerance 02/25/2012   History of non-Hodgkin's lymphoma 11/18/2011   Fibromyalgia 11/18/2011   Mucous retention cyst of maxillary sinus 11/18/2011   Psoriasis 11/18/2011   Anemia, unspecified 11/18/2011   PUD (peptic ulcer disease) 11/18/2011   Degenerative joint disease 11/18/2011   Lumbar disc disease 11/18/2011   Allergic rhinitis    Depression    Insomnia    Hyperlipidemia    Anxiety    Palpitation-Previously thought to be atrial fibrillation now with Holter data to suggest PVCs     REFERRING DIAG: deconditioning; weakness  THERAPY DIAG:  Unsteadiness on feet  Muscle weakness (generalized)  PERTINENT HISTORY: fibromyalgia; history of cancer  PRECAUTIONS: fibromyalgia   SUBJECTIVE: I'm always going to have some pain.  I can manage at this level.    No cane today.  Are you having pain? PAIN:  Are you having pain? Yes NPRS scale: 6/10 Pain location: Rt back and  buttock Ice helps to reduce the pain   OBJECTIVE: (objective measures completed at initial evaluation unless otherwise dated)  09/20/21 DIAGNOSTIC FINDINGS: none   COGNITION: Overall cognitive status: Within functional limits for tasks assessed               POSTURE: rounded shoulders and forward head   LE ROM:   WFLs   MMT:  LEs grossly 4/5 except hip abductors 4-/5 bil;  able to rise from chair without UE assist   6/22: hip abductors and trunk/core muscles 4/5    Comments: Loss of balance with putting on rain coat, min assist to stabilize   FUNCTIONAL TESTs: no cane 5 times sit to stand: 16  2 minute walk test: 3 min walk test 12/20  Perceived exertion 500 feet Berg Balance Scale: 46/56  10/29/21: 5x sit to stand: 14.3 seconds  4 min walk test: 648 feet  11/12/21:  Berg Balance:   50/56-   6/22:   5x sit to stand 13.49 sec no UE assist TUG: 12.86 sec no cane      TODAY'S TREATMENT:   7/6: Nu-Step 10 min  L1 while discussing status Seated core: 4# plyo ball: hip to hip, shoulder to hip, ear to ear 15x Seated 4# on knee lift to tap low cone 10x right/left  Seated heel raise with 6# weightresting on thigh 10x right/left Standing at the bar: side to side weight shift; front back weight shift-standing on balance pad x 1 min each Marching at bar 10x Side stepping at the bar 2 laps  Manual: In prone soft tissue/MFR mobilization with Addaday to bil lumbar paraspinals and gluteals Ice pack to low back 10 min for pain control (prone)      6/22: 5x sit to stand TUG  Deferred longer walk since she doesn't have her cane and fears it would increase her pain Seated core: 4# plyo ball: hip to hip, shoulder to hip, ear to ear 15x Seated heel raise with 4# ball resting on thigh 10x right/left Standing at the bar: side to side weight shift; front back weight shift-standing on balance pad x 1 min each Marching at [employer] 10x  Manual: In prone soft tissue/MFR mobilization with Addaday  Ice pack to low back 10 min for pain control (prone)      11/12/21: Belinda Mcdowell Balance test performed- see above score Seated core: 4# plyo ball: hip to hip, shoulder to hip, ear to ear Seated heel raise with 4# ball resting on thigh 10x right/left Standing at the bar: side to side weight shift; front back weight shift-standing on balance pad x 1 min each Marching at [employer] 10x  Manual: In Lt sidelying with Rt LE supported;soft tissue/MFR mobilization with Addaday  Ice pack to low back 10 min for pain control   PATIENT EDUCATION: Education details: Access Code: [email or web address] Person educated: Patient Education method: Explanation,  demo Education comprehension: verbalized understanding, return demo     HOME EXERCISE PROGRAM: From prior session of PT last year:  Access Code: [email or web address] URL: [email or web address] Date: 09/26/2021 Prepared by: Claiborne Billings  Exercises - Standing Terminal Knee Extension at [employer] with Diona Foley  - 1 x daily - 7 x weekly - 2 sets - 12 reps - Sit to Stand Without Arm Support  - 1 x daily - 7 x weekly - 2 sets - 10 reps - Tandem Stance in Corner  - 1 x daily - 7 x weekly - 1 sets - 2-3 reps - 30s hold - Supine  Bridge  - 1 x daily - 7 x weekly - 2 sets - 12 reps - Standing Hip Abduction with Resistance at Ankles and Counter Support  - 1 x daily - 7 x weekly - 2 sets - 10 reps - Standing Hip Extension with Resistance at Ankles and Counter Support  - 1 x daily - 7 x weekly - 2 sets - 10 reps - Single Leg Stance  - 1 x daily - 7 x weekly - 1 sets - 3 reps - 5-15s hold - Standing Shoulder External Rotation with Resistance  - 1 x daily - 7 x weekly - 2 sets - 10 reps - Seated Shoulder Horizontal Abduction with Resistance  - 1 x daily - 7 x weekly - 2 sets - 10 reps       GOALS: Goals reviewed with patient? Yes   SHORT TERM GOALS: Target date: 10/18/2021   The patient will demonstrate knowledge of basic self care strategies and exercises for improved strength and mobility Baseline: Goal status: MET (10/29/21)   2.  The patient will have an improved BERG balance score to  49/56 indicating reduced risk of falls  Baseline:  50/56 (11/12/21) Goal status: MET   3.  5x sit to stand test to 15.5 sec indicating improved LE strength  Baseline: 14.3 seconds (10/29/21) Goal status: goal met 6/22   4.  Able to ambulate 600 feet in 4 min  Baseline: 648 feet (10/29/21) Goal status: MET       LONG TERM GOALS: Target date: 01/10/2022   The patient will be independent with a safe self progressive HEP needed for long term strengthening needed for home and community mobility Baseline:  Goal status:  INITIAL   2.  BERG score improved to 51/56 indicating decreased fall risk Baseline:  Goal status: INITIAL   3.  Patient will complete 5x sit to stand in  14 sec less indicating improved LE muscular endurance for activity tolerance  Baseline:  Goal status: goal met 6/22   4.  Gait 700 feet in 6 minutes with perceived exertion of 12/20 Baseline:  Goal status: INITIAL   5.  The patient will have improved LE strength to at least 4+/5 needed for standing, walking longer distances and negotiating curbs at home and in the community  Baseline:  Goal status: INITIAL 6. Timed up and Go improved to 12 sec or less indicating improved gait speed and agility     ASSESSMENT:   CLINICAL IMPRESSION:   The patient is able to tolerate balance and general strengthening much better when pain is well-controlled (manual therapy and ice particularly helpful).  She is tolerating a slow progression of ex volume and intensity to avoid a fibromyalgia flare up.  No loss of balance during session.  Therapist monitoring pain and providing supervision for safety.     OBJECTIVE IMPAIRMENTS decreased activity tolerance, decreased endurance, difficulty walking, decreased strength, and impaired perceived functional ability.    ACTIVITY LIMITATIONS cleaning, meal prep, laundry, and shopping.    PERSONAL FACTORS Age and 1 comorbidity: fibromyalgia  are also affecting patient's functional outcome.      REHAB POTENTIAL: Good   CLINICAL DECISION MAKING: Stable/uncomplicated   EVALUATION COMPLEXITY: Low   PLAN: PT FREQUENCY: 2x/week   PT DURATION: 8 weeks   PLANNED INTERVENTIONS: Therapeutic exercises, Therapeutic activity, Neuromuscular re-education, Balance training, Gait training, Patient/Family education, Joint mobilization, Aquatic Therapy, Moist heat, Taping, and Manual therapy   PLAN FOR NEXT SESSION:  needs low intensity  exercise secondary to reports of flare ups  ; standing balance; LE and core  strengthening; Addaday and ice at end of session for pain control  Ruben Im, PT 11/29/21 4:58 PM Phone: [phone] Fax: [facility] [address] [place], [place] [address] Phone # [phone] Fax [phone]

## 2021-12-03 ENCOUNTER — Ambulatory Visit: Payer: Medicare HMO | Admitting: Physical Therapy

## 2021-12-03 ENCOUNTER — Encounter: Payer: Self-pay | Admitting: Physical Therapy

## 2021-12-03 DIAGNOSIS — R2681 Unsteadiness on feet: Secondary | ICD-10-CM

## 2021-12-03 DIAGNOSIS — M6281 Muscle weakness (generalized): Secondary | ICD-10-CM | POA: Diagnosis not present

## 2021-12-03 NOTE — Therapy (Signed)
OUTPATIENT PHYSICAL THERAPY TREATMENT NOTE/Recertification   Patient Name: Belinda Mcdowell MRN: [id number] DOB:07/13/1940, 81 y.o., female Today's Date: 12/03/2021  PCP: Dr. Dibas Koirala REFERRING PROVIDER: Dr. Dibas Koirala    END OF SESSION:   PT End of Session - 12/03/21 1527     Visit Number 13    Date for PT Re-Evaluation 01/10/22    Authorization Type [employer]    Progress Note Due on Visit 20    PT Start Time 1527    PT Stop Time 1616    PT Time Calculation (min) 49 min    Activity Tolerance Patient tolerated treatment well    Behavior During Therapy WFL for tasks assessed/performed              Past Medical History:  Diagnosis Date   Abnormal SPEP 02/21/2015   Allergic rhinitis, cause unspecified    Allergy    states she has environmental allergies; and takes zyrtec   Anemia, unspecified 11/18/2011   Prior on aranesp   Anxiety    Arthritis    Chronic fatigue 02/21/2015   Chronic headache disorder 02/21/2015   Degenerative joint disease 11/18/2011   diffuse   Depression    Fibromyalgia 11/18/2011   Generalized headaches    Hyperlipidemia    Increased urinary frequency 02/21/2015   Insomnia    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   Abnormal SPEP 02/21/2015   Chronic headache disorder 02/21/2015   Increased urinary frequency 02/21/2015   Chronic fatigue 02/21/2015   Mouth pain 12/02/2014   General weakness 12/02/2014   Dysuria 04/19/2014   Right flank pain 04/19/2014   Urinary frequency 12/10/2013   Essential hypertension 03/19/2013   Preventative health care 12/08/2012   Conjunctivitis, allergic 06/10/2012   Exercise intolerance 02/25/2012   History of non-Hodgkin's lymphoma 11/18/2011   Fibromyalgia 11/18/2011   Mucous retention cyst of maxillary sinus 11/18/2011   Psoriasis 11/18/2011   Anemia, unspecified 11/18/2011   PUD (peptic ulcer disease) 11/18/2011   Degenerative joint disease 11/18/2011   Lumbar disc disease 11/18/2011   Allergic rhinitis    Depression    Insomnia    Hyperlipidemia    Anxiety    Palpitation-Previously thought to be atrial fibrillation now with Holter data to suggest PVCs     REFERRING DIAG: deconditioning; weakness  THERAPY DIAG:  Unsteadiness on feet  Muscle weakness (generalized)  PERTINENT HISTORY: fibromyalgia; history of cancer  PRECAUTIONS: fibromyalgia   SUBJECTIVE: For some reason pain is higher, not sure why.  Are you having pain? PAIN:  Are you having pain? Yes NPRS scale: 9/10 Pain location: Rt back and buttock   Ice helps to reduce the pain   OBJECTIVE: (objective measures completed at initial evaluation unless otherwise dated)  09/20/21 DIAGNOSTIC FINDINGS: none   COGNITION: Overall cognitive status: Within functional limits for tasks assessed               POSTURE: rounded shoulders and forward head   LE ROM:   WFLs   MMT:  LEs grossly 4/5 except hip abductors 4-/5 bil;  able to rise from chair without UE assist   6/22: hip abductors and trunk/core muscles 4/5    Comments: Loss of balance with putting on rain coat, min assist to stabilize   FUNCTIONAL TESTs: no cane 5 times sit to stand: 16  2  minute walk test: 3 min walk test 12/20 Perceived exertion 500 feet Berg Balance Scale: 46/56  10/29/21: 5x sit to stand: 14.3 seconds  4 min walk test: 648 feet  11/12/21:  Berg Balance:   50/56-   6/22:   5x sit to stand 13.49 sec no UE assist TUG: 12.86 sec no cane      TODAY'S TREATMENT:   12/03/21: Nu-Step 10 min  L1 while discussing status and pain Seated core: 4# plyo ball: hip to hip, shoulder to hip, ear to ear 10x Seated 4# on knee lift to tap low cone 10x right/left  Seated heel raise with 6# weightresting on thigh 10x right/left Standing at the bar: side to side weight shift; front back weight shift-standing balance pad x 1 min each Marching at bar 15x Side stepping at the bar 2 laps Manual: In prone soft tissue/MFR mobilization with Addaday to bil lumbar paraspinals and gluteals Ice pack to low back 10 min for pain control (prone)  7/6: Nu-Step 10 min  L1 while discussing status Seated core: 4# plyo ball: hip to hip, shoulder to hip, ear to ear 15x Seated 4# on knee lift to tap low cone 10x right/left  Seated heel raise with 6# weightresting on thigh 10x right/left Standing at the bar: side to side weight shift; front back weight shift-standing on balance pad x 1 min each Marching at bar 10x Side stepping at the bar 2 laps  Manual: In prone soft tissue/MFR mobilization with Addaday to bil lumbar paraspinals and gluteals Ice pack to low back 10 min for pain control (prone)      6/22: 5x sit to stand TUG  Deferred longer walk since she doesn't have her cane and fears it would increase her pain Seated core: 4# plyo ball: hip to hip, shoulder to hip, ear to ear 15x Seated heel raise with 4# ball resting on thigh 10x right/left Standing at the bar: side to side weight shift; front back weight shift-standing on balance pad x 1 min each Marching at [employer] 10x  Manual: In prone soft tissue/MFR mobilization with Addaday  Ice pack to low back 10 min for pain  control (prone)      PATIENT EDUCATION: Education details: Access Code: [email or web address] Person educated: Patient Education method: Explanation, demo Education comprehension: verbalized understanding, return demo     HOME EXERCISE PROGRAM: From prior session of PT last year:  Access Code: [email or web address] URL: [email or web address] Date: 09/26/2021 Prepared by: Claiborne Billings  Exercises - Standing Terminal Knee Extension at Wall with Diona Foley  - 1 x daily - 7 x weekly - 2 sets - 12 reps - Sit to Stand Without Arm Support  - 1 x daily - 7 x weekly - 2 sets - 10 reps - Tandem Stance in Corner  -  1 x daily - 7 x weekly - 1 sets - 2-3 reps - 30s hold - Supine Bridge  - 1 x daily - 7 x weekly - 2 sets - 12 reps - Standing Hip Abduction with Resistance at Ankles and Counter Support  - 1 x daily - 7 x weekly - 2 sets - 10 reps - Standing Hip Extension with Resistance at Ankles and Counter Support  - 1 x daily - 7 x weekly - 2 sets - 10 reps - Single Leg Stance  - 1 x daily - 7 x weekly - 1 sets - 3 reps - 5-15s hold - Standing Shoulder External Rotation with Resistance  - 1 x daily - 7 x weekly - 2 sets - 10 reps - Seated Shoulder Horizontal Abduction with Resistance  - 1 x daily - 7 x weekly - 2 sets - 10 reps       GOALS: Goals reviewed with patient? Yes   SHORT TERM GOALS: Target date: 10/18/2021   The patient will demonstrate knowledge of basic self care strategies and exercises for improved strength and mobility Baseline: Goal status: MET (10/29/21)   2.  The patient will have an improved BERG balance score to  49/56 indicating reduced risk of falls  Baseline:  50/56 (11/12/21) Goal status: MET   3.  5x sit to stand test to 15.5 sec indicating improved LE strength  Baseline: 14.3 seconds (10/29/21) Goal status: goal met 6/22   4.  Able to ambulate 600 feet in 4 min  Baseline: 648 feet (10/29/21) Goal status: MET       LONG TERM GOALS: Target date: 01/10/2022   The patient will be  independent with a safe self progressive HEP needed for long term strengthening needed for home and community mobility Baseline:  Goal status: INITIAL   2.  BERG score improved to 51/56 indicating decreased fall risk Baseline:  Goal status: INITIAL   3.  Patient will complete 5x sit to stand in  14 sec less indicating improved LE muscular endurance for activity tolerance  Baseline:  Goal status: goal met 6/22   4.  Gait 700 feet in 6 minutes with perceived exertion of 12/20 Baseline:  Goal status: INITIAL   5.  The patient will have improved LE strength to at least 4+/5 needed for standing, walking longer distances and negotiating curbs at home and in the community  Baseline:  Goal status: INITIAL 6. Timed up and Go improved to 12 sec or less indicating improved gait speed and agility     ASSESSMENT:   CLINICAL IMPRESSION:   Pt arrives with high level of pain but would like to try the exercises she did from last session.A few exercises pt could actually do a few more reps but a few she had to do slightly less.   OBJECTIVE IMPAIRMENTS decreased activity tolerance, decreased endurance, difficulty walking, decreased strength, and impaired perceived functional ability.    ACTIVITY LIMITATIONS cleaning, meal prep, laundry, and shopping.    PERSONAL FACTORS Age and 1 comorbidity: fibromyalgia  are also affecting patient's functional outcome.      REHAB POTENTIAL: Good   CLINICAL DECISION MAKING: Stable/uncomplicated   EVALUATION COMPLEXITY: Low   PLAN: PT FREQUENCY: 2x/week   PT DURATION: 8 weeks   PLANNED INTERVENTIONS: Therapeutic exercises, Therapeutic activity, Neuromuscular re-education, Balance training, Gait training, Patient/Family education, Joint mobilization, Aquatic Therapy, Moist heat, Taping, and Manual therapy   PLAN FOR NEXT SESSION:  needs low intensity exercise secondary   to reports of flare ups  ; standing balance; LE and core strengthening; Addaday and ice  at end of session for pain control  Jennifer Cochran, PTA 12/03/21 4:05 PM   Phone: [phone] Fax: [phone]  [facility] [address], [address] [place], [place] [address] Phone # [phone] Fax [phone]      

## 2021-12-06 ENCOUNTER — Ambulatory Visit: Payer: Medicare HMO | Admitting: Physical Therapy

## 2021-12-10 ENCOUNTER — Ambulatory Visit: Payer: Medicare HMO

## 2021-12-10 DIAGNOSIS — M6281 Muscle weakness (generalized): Secondary | ICD-10-CM | POA: Diagnosis not present

## 2021-12-10 DIAGNOSIS — R2681 Unsteadiness on feet: Secondary | ICD-10-CM

## 2021-12-10 NOTE — Therapy (Addendum)
OUTPATIENT PHYSICAL THERAPY TREATMENT NOTE/Recertification   Patient Name: Belinda Mcdowell MRN: [id number] DOB:[birth year], 81 y.o., female Today's Date: 12/10/2021  PCP: Dr. Lujean Amel REFERRING PROVIDER: Dr. Lujean Amel    END OF SESSION:   PT End of Session - 12/10/21 1703     Visit Number 14    Date for PT Re-Evaluation 01/10/22    Authorization Type [employer]    Progress Note Due on Visit 20    PT Start Time 1617    PT Stop Time 1704    PT Time Calculation (min) 47 min    Activity Tolerance Patient tolerated treatment well    Behavior During Therapy WFL for tasks assessed/performed               Past Medical History:  Diagnosis Date   Abnormal SPEP 02/21/2015   Allergic rhinitis, cause unspecified    Allergy    states she has environmental allergies; and takes zyrtec   Anemia, unspecified 11/18/2011   Prior on aranesp   Anxiety    Arthritis    Chronic fatigue 02/21/2015   Chronic headache disorder 02/21/2015   Degenerative joint disease 11/18/2011   diffuse   Depression    Fibromyalgia 11/18/2011   Generalized headaches    Hyperlipidemia    Increased urinary frequency 02/21/2015   Insomnia    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   Acquired  hypogammaglobulinemia ([place]) 03/02/2015   Abnormal SPEP 02/21/2015   Chronic headache disorder 02/21/2015   Increased urinary frequency 02/21/2015   Chronic fatigue 02/21/2015   Mouth pain 12/02/2014   General weakness 12/02/2014   Dysuria 04/19/2014   Right flank pain 04/19/2014   Urinary frequency 12/10/2013   Essential hypertension 03/19/2013   Preventative health care 12/08/2012   Conjunctivitis, allergic 06/10/2012   Exercise intolerance 02/25/2012   History of non-Hodgkin's lymphoma 11/18/2011   Fibromyalgia 11/18/2011   Mucous retention cyst of maxillary sinus 11/18/2011   Psoriasis 11/18/2011   Anemia, unspecified 11/18/2011   PUD (peptic ulcer disease) 11/18/2011   Degenerative joint disease 11/18/2011   Lumbar disc disease 11/18/2011   Allergic rhinitis    Depression    Insomnia    Hyperlipidemia    Anxiety    Palpitation-Previously thought to be atrial fibrillation now with Holter data to suggest PVCs     REFERRING DIAG: deconditioning; weakness  THERAPY DIAG:  Unsteadiness on feet  Muscle weakness (generalized)  PERTINENT HISTORY: fibromyalgia; history of cancer  PRECAUTIONS: fibromyalgia   SUBJECTIVE: For some reason pain is higher, not sure why.  Are you having pain? PAIN:  Are you having pain? Yes NPRS scale: 9/10 Pain location: Rt back and  buttock Ice helps to reduce the pain   OBJECTIVE: (objective measures completed at initial evaluation unless otherwise dated)  09/20/21 DIAGNOSTIC FINDINGS: none   COGNITION: Overall cognitive status: Within functional limits for tasks assessed               POSTURE: rounded shoulders and forward head   LE ROM:   WFLs   MMT:  LEs grossly 4/5 except hip abductors 4-/5 bil;  able to rise from chair without UE assist   6/22: hip abductors and trunk/core muscles 4/5    Comments: Loss of balance with putting on rain coat, min assist to stabilize   FUNCTIONAL TESTs: no cane 5 times sit to stand: 16  2  minute walk test: 3 min walk test 12/20 Perceived exertion 500 feet Berg Balance Scale: 46/56  10/29/21: 5x sit to stand: 14.3 seconds  4 min walk test: 648 feet  11/12/21:  Berg Balance:   50/56-   6/22:   5x sit to stand 13.49 sec no UE assist TUG: 12.86 sec no cane      TODAY'S TREATMENT:  12/10/21: Nu-Step 10 min  L1 while discussing status and pain Seated core: 4# plyo ball: hip to hip, shoulder to hip, ear to ear 10x Seated 4# on knee lift to tap low cone 10x right/left  Seated heel raise with 6# weightresting on thigh 10x right/left Standing at the bar: side to side weight shift; front back weight shift-standing balance pad x 1 min each Marching at bar 15x Side stepping at the bar 2 laps Manual: In Lt sidelyling.  Addaday to Rt gluteals.  Ice pack to low back 10 min for pain control supine  12/03/21: Nu-Step 10 min  L1 while discussing status and pain Seated core: 4# plyo ball: hip to hip, shoulder to hip, ear to ear 10x Seated 4# on knee lift to tap low cone 10x right/left  Seated heel raise with 6# weightresting on thigh 10x right/left Standing at the bar: side to side weight shift; front back weight shift-standing balance pad x 1 min each Marching at bar 15x Side stepping at the bar 2 laps Manual: In prone soft tissue/MFR mobilization with Addaday to bil lumbar paraspinals and gluteals Ice pack to low back 10 min for pain control (prone)  7/6: Nu-Step 10 min  L1 while discussing status Seated core: 4# plyo ball: hip to hip, shoulder to hip, ear to ear 15x Seated 4# on knee lift to tap low cone 10x right/left  Seated heel raise with 6# weightresting on thigh 10x right/left Standing at the bar: side to side weight shift; front back weight shift-standing on balance pad x 1 min each Marching at bar 10x Side stepping at the bar 2 laps  Manual: In prone soft tissue/MFR mobilization with Addaday to bil lumbar paraspinals and gluteals Ice pack to low back 10 min for pain  control (prone)  PATIENT EDUCATION: Education details: Access Code: [email or web address] Person educated: Patient Education method: Explanation, demo Education comprehension: verbalized understanding, return demo     HOME EXERCISE PROGRAM: From prior session of PT last year:  Access Code: [email or web address] URL: [email or web address] Date: 09/26/2021 Prepared by: Claiborne Billings  Exercises - Standing Terminal Knee Extension at [employer] with Diona Foley  - 1 x daily - 7 x weekly - 2 sets - 12 reps - Sit to Stand Without Arm Support  - 1 x daily - 7 x weekly - 2 sets - 10 reps - Tandem Stance in Corner  - 1  x daily - 7 x weekly - 1 sets - 2-3 reps - 30s hold - Supine Bridge  - 1 x daily - 7 x weekly - 2 sets - 12 reps - Standing Hip Abduction with Resistance at Ankles and Counter Support  - 1 x daily - 7 x weekly - 2 sets - 10 reps - Standing Hip Extension with Resistance at Ankles and Counter Support  - 1 x daily - 7 x weekly - 2 sets - 10 reps - Single Leg Stance  - 1 x daily - 7 x weekly - 1 sets - 3 reps - 5-15s hold - Standing Shoulder External Rotation with Resistance  - 1 x daily - 7 x weekly - 2 sets - 10 reps - Seated Shoulder Horizontal Abduction with Resistance  - 1 x daily - 7 x weekly - 2 sets - 10 reps       GOALS: Goals reviewed with patient? Yes   SHORT TERM GOALS: Target date: 10/18/2021   The patient will demonstrate knowledge of basic self care strategies and exercises for improved strength and mobility Baseline: Goal status: MET (10/29/21)   2.  The patient will have an improved BERG balance score to  49/56 indicating reduced risk of falls  Baseline:  50/56 (11/12/21) Goal status: MET   3.  5x sit to stand test to 15.5 sec indicating improved LE strength  Baseline: 14.3 seconds (10/29/21) Goal status: goal met 6/22   4.  Able to ambulate 600 feet in 4 min  Baseline: 648 feet (10/29/21) Goal status: MET       LONG TERM GOALS: Target date: 01/10/2022   The patient will be independent  with a safe self progressive HEP needed for long term strengthening needed for home and community mobility Baseline:  Goal status: INITIAL   2.  BERG score improved to 51/56 indicating decreased fall risk Baseline:  Goal status: INITIAL   3.  Patient will complete 5x sit to stand in  14 sec less indicating improved LE muscular endurance for activity tolerance  Baseline:  Goal status: goal met 6/22   4.  Gait 700 feet in 6 minutes with perceived exertion of 12/20 Baseline:  Goal status: INITIAL   5.  The patient will have improved LE strength to at least 4+/5 needed for standing, walking longer distances and negotiating curbs at home and in the community  Baseline:  Goal status: INITIAL 6. Timed up and Go improved to 12 sec or less indicating improved gait speed and agility     ASSESSMENT:   CLINICAL IMPRESSION:   Pt tolerated session very well today and reports that she is significantly better overall regarding function.  Pt remains challenged by current level of exercise and PT monitored throughout session for pain, technique and safety.  Patient will benefit from skilled PT to address the below impairments and improve overall function.   OBJECTIVE IMPAIRMENTS decreased activity tolerance, decreased endurance, difficulty walking, decreased strength, and impaired perceived functional ability.    ACTIVITY LIMITATIONS cleaning, meal prep, laundry, and shopping.    PERSONAL FACTORS Age and 1 comorbidity: fibromyalgia  are also affecting patient's functional outcome.      REHAB POTENTIAL: Good   CLINICAL DECISION MAKING: Stable/uncomplicated   EVALUATION COMPLEXITY: Low   PLAN: PT FREQUENCY: 2x/week   PT DURATION: 8 weeks   PLANNED INTERVENTIONS: Therapeutic exercises, Therapeutic activity, Neuromuscular re-education, Balance training, Gait training, Patient/Family education, Joint mobilization, Aquatic Therapy, Moist heat, Taping, and Manual therapy  PLAN FOR NEXT  SESSION:  needs low intensity exercise secondary to reports of flare ups  Keep at current level of exercise with only minor incremental advancements   Sigurd Sos, PT 12/10/21 5:05 PM   Phone: [phone] Fax: [place], [address] [place], [place] [address] Phone # [phone] Fax [phone]

## 2021-12-13 ENCOUNTER — Ambulatory Visit: Payer: Medicare HMO | Admitting: Physical Therapy

## 2021-12-17 ENCOUNTER — Ambulatory Visit: Payer: Medicare HMO

## 2021-12-17 DIAGNOSIS — M6281 Muscle weakness (generalized): Secondary | ICD-10-CM | POA: Diagnosis not present

## 2021-12-17 DIAGNOSIS — R2681 Unsteadiness on feet: Secondary | ICD-10-CM

## 2021-12-17 NOTE — Therapy (Addendum)
OUTPATIENT PHYSICAL THERAPY TREATMENT NOTE/Recertification   Patient Name: Belinda Mcdowell MRN: [id number] DOB:[birth year], 81 y.o., female Today's Date: 12/17/2021  PCP: Dr. Lujean Amel REFERRING PROVIDER: Dr. Lujean Amel    END OF SESSION:   PT End of Session - 12/17/21 1658     Visit Number 15    Date for PT Re-Evaluation 01/10/22    Authorization Type [employer]    Progress Note Due on Visit 88    PT Start Time 1615    PT Stop Time 1705    PT Time Calculation (min) 50 min    Activity Tolerance Patient tolerated treatment well    Behavior During Therapy WFL for tasks assessed/performed                Past Medical History:  Diagnosis Date   Abnormal SPEP 02/21/2015   Allergic rhinitis, cause unspecified    Allergy    states she has environmental allergies; and takes zyrtec   Anemia, unspecified 11/18/2011   Prior on aranesp   Anxiety    Arthritis    Chronic fatigue 02/21/2015   Chronic headache disorder 02/21/2015   Degenerative joint disease 11/18/2011   diffuse   Depression    Fibromyalgia 11/18/2011   Generalized headaches    Hyperlipidemia    Increased urinary frequency 02/21/2015   Insomnia    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   Abnormal SPEP 02/21/2015   Chronic headache disorder 02/21/2015   Increased urinary frequency 02/21/2015   Chronic fatigue 02/21/2015   Mouth pain 12/02/2014   General weakness 12/02/2014   Dysuria 04/19/2014   Right flank pain 04/19/2014   Urinary frequency 12/10/2013   Essential hypertension 03/19/2013   Preventative health care 12/08/2012   Conjunctivitis, allergic 06/10/2012   Exercise intolerance 02/25/2012   History of non-Hodgkin's lymphoma 11/18/2011   Fibromyalgia 11/18/2011   Mucous retention cyst of maxillary sinus 11/18/2011   Psoriasis 11/18/2011   Anemia, unspecified 11/18/2011   PUD (peptic ulcer disease) 11/18/2011   Degenerative joint disease 11/18/2011   Lumbar disc disease 11/18/2011   Allergic rhinitis    Depression    Insomnia    Hyperlipidemia    Anxiety    Palpitation-Previously thought to be atrial fibrillation now with Holter data to suggest PVCs     REFERRING DIAG: deconditioning; weakness  THERAPY DIAG:  Unsteadiness on feet  Muscle weakness (generalized)  PERTINENT HISTORY: fibromyalgia; history of cancer  PRECAUTIONS: fibromyalgia   SUBJECTIVE: For some reason pain is higher, not sure why.  Are you having pain? PAIN:  Are you having pain? Yes NPRS scale: 9/10 Pain location: Rt back  and buttock Ice helps to reduce the pain   OBJECTIVE: (objective measures completed at initial evaluation unless otherwise dated)  09/20/21 DIAGNOSTIC FINDINGS: none   COGNITION: Overall cognitive status: Within functional limits for tasks assessed               POSTURE: rounded shoulders and forward head   LE ROM:   WFLs   MMT:  LEs grossly 4/5 except hip abductors 4-/5 bil;  able to rise from chair without UE assist   6/22: hip abductors and trunk/core muscles 4/5    Comments: Loss of balance with putting on rain coat, min assist to stabilize   FUNCTIONAL TESTs: no cane 5 times sit to stand: 16  2  minute walk test: 3 min walk test 12/20 Perceived exertion 500 feet Berg Balance Scale: 46/56  10/29/21: 5x sit to stand: 14.3 seconds  4 min walk test: 648 feet  11/12/21:  Berg Balance:   50/56-   6/22:   5x sit to stand 13.49 sec no UE assist TUG: 12.86 sec no cane      TODAY'S TREATMENT:  12/17/21: Nu-Step 10 min  L1 while discussing status and pain Seated core: 4# plyo ball: hip to hip, shoulder to hip, ear to ear 10x Seated 4# on knee lift to tap low cone 10x right/left  Seated heel raise with 6# weightresting on thigh 10x right/left Standing at the bar: side to side weight shift; front back weight shift-standing balance pad x 1 min each Marching at bar 15x Side stepping at the bar 2 laps Manual: In Lt sidelyling.  Addaday to Rt gluteals.  Ice pack to low back 10 min for pain control supine  12/10/21: Nu-Step 10 min  L1 while discussing status and pain Seated core: 4# plyo ball: hip to hip, shoulder to hip, ear to ear 10x Seated 4# on knee lift to tap low cone 10x right/left  Seated heel raise with 6# weightresting on thigh 10x right/left Standing at the bar: side to side weight shift; front back weight shift-standing balance pad x 1 min each Marching at bar 15x Side stepping at the bar 2 laps Manual: In Lt sidelyling.  Addaday to Rt gluteals.  Ice pack to low back 10 min for pain control supine  12/03/21: Nu-Step 10 min  L1 while discussing status and pain Seated core: 4# plyo ball: hip to hip, shoulder to hip, ear to ear 10x Seated 4# on knee lift to tap low cone 10x right/left  Seated heel raise with 6# weightresting on thigh 10x right/left Standing at the bar: side to side weight shift; front back weight shift-standing balance pad x 1 min each Marching at bar 15x Side stepping at the bar 2 laps Manual: In prone soft tissue/MFR mobilization with Addaday to bil lumbar paraspinals and gluteals Ice pack to low back 10 min for pain control (prone)   PATIENT  EDUCATION: Education details: Access Code: [email or web address] Person educated: Patient Education method: Explanation, demo Education comprehension: verbalized understanding, return demo     HOME EXERCISE PROGRAM: From prior session of PT last year:  Access Code: [email or web address] URL: [email or web address] Date: 09/26/2021 Prepared by: Claiborne Billings  Exercises - Standing Terminal Knee Extension at [employer] with Diona Foley  - 1 x daily - 7 x weekly - 2 sets - 12 reps - Sit to Stand Without Arm Support  - 1 x daily - 7 x weekly - 2 sets - 10 reps - Tandem Stance in Corner  - 1 x daily -  7 x weekly - 1 sets - 2-3 reps - 30s hold - Supine Bridge  - 1 x daily - 7 x weekly - 2 sets - 12 reps - Standing Hip Abduction with Resistance at Ankles and Counter Support  - 1 x daily - 7 x weekly - 2 sets - 10 reps - Standing Hip Extension with Resistance at Ankles and Counter Support  - 1 x daily - 7 x weekly - 2 sets - 10 reps - Single Leg Stance  - 1 x daily - 7 x weekly - 1 sets - 3 reps - 5-15s hold - Standing Shoulder External Rotation with Resistance  - 1 x daily - 7 x weekly - 2 sets - 10 reps - Seated Shoulder Horizontal Abduction with Resistance  - 1 x daily - 7 x weekly - 2 sets - 10 reps       GOALS: Goals reviewed with patient? Yes   SHORT TERM GOALS: Target date: 10/18/2021   The patient will demonstrate knowledge of basic self care strategies and exercises for improved strength and mobility Baseline: Goal status: MET (10/29/21)   2.  The patient will have an improved BERG balance score to  49/56 indicating reduced risk of falls  Baseline:  50/56 (11/12/21) Goal status: MET   3.  5x sit to stand test to 15.5 sec indicating improved LE strength  Baseline: 14.3 seconds (10/29/21) Goal status: goal met 6/22   4.  Able to ambulate 600 feet in 4 min  Baseline: 648 feet (10/29/21) Goal status: MET       LONG TERM GOALS: Target date: 01/10/2022   The patient will be independent with a safe self  progressive HEP needed for long term strengthening needed for home and community mobility Baseline:  Goal status: In progress    2.  BERG score improved to 51/56 indicating decreased fall risk Baseline:  50/56 (10/29/21) Goal status: INITIAL   3.  Patient will complete 5x sit to stand in  14 sec less indicating improved LE muscular endurance for activity tolerance  Baseline:  Goal status: goal met 6/22   4.  Gait 700 feet in 6 minutes with perceived exertion of 12/20 Baseline:  Goal status: INITIAL   5.  The patient will have improved LE strength to at least 4+/5 needed for standing, walking longer distances and negotiating curbs at home and in the community  Baseline:  Goal status: INITIAL 6. Timed up and Go improved to 12 sec or less indicating improved gait speed and agility      ASSESSMENT:   CLINICAL IMPRESSION:   Pt reports >75% overall improvement in her symptoms since the start of care.  Pt highlights her mobility as the greatest gains.  PT monitored pt for safety, pain and technique.  Pt has improved in all of her balance testing throughout her sessions and final goals will be assessed prior to D/C next session.  Pt responds well to manual and ice to Rt gluteals/low back at the end of session.    OBJECTIVE IMPAIRMENTS decreased activity tolerance, decreased endurance, difficulty walking, decreased strength, and impaired perceived functional ability.    ACTIVITY LIMITATIONS cleaning, meal prep, laundry, and shopping.    PERSONAL FACTORS Age and 1 comorbidity: fibromyalgia  are also affecting patient's functional outcome.      REHAB POTENTIAL: Good   CLINICAL DECISION MAKING: Stable/uncomplicated   EVALUATION COMPLEXITY: Low   PLAN: PT FREQUENCY: 2x/week   PT DURATION: 8 weeks  PLANNED INTERVENTIONS: Therapeutic exercises, Therapeutic activity, Neuromuscular re-education, Balance training, Gait training, Patient/Family education, Joint mobilization, Aquatic  Therapy, Moist heat, Taping, and Manual therapy   PLAN FOR NEXT SESSION:  D/C PT next session to HEP.  Final goal assessment.    Sigurd Sos, PT 12/17/21 4:59 PM  PHYSICAL THERAPY DISCHARGE SUMMARY  Visits from Start of Care: 15  Current functional level related to goals / functional outcomes: See above for current status.     Remaining deficits: Pain and limited mobility secondary to chronic condition.  Pt will continue with her HEP for continued gains     Education / Equipment: HEP   Patient agrees to discharge. Patient goals were partially met. Patient is being discharged due to being pleased with the current functional level.  Phone: [phone] Fax: [phone]  [facility] [address], [address] [place], [place] [address] Phone # [phone] Fax [phone]

## 2021-12-20 ENCOUNTER — Ambulatory Visit: Payer: Medicare HMO | Admitting: Physical Therapy

## 2021-12-25 DIAGNOSIS — B338 Other specified viral diseases: Secondary | ICD-10-CM | POA: Diagnosis not present

## 2022-01-04 DIAGNOSIS — E039 Hypothyroidism, unspecified: Secondary | ICD-10-CM | POA: Diagnosis not present

## 2022-01-26 DIAGNOSIS — S63502A Unspecified sprain of left wrist, initial encounter: Secondary | ICD-10-CM | POA: Diagnosis not present

## 2022-01-26 DIAGNOSIS — S52122A Displaced fracture of head of left radius, initial encounter for closed fracture: Secondary | ICD-10-CM | POA: Diagnosis not present

## 2022-01-26 DIAGNOSIS — S52592A Other fractures of lower end of left radius, initial encounter for closed fracture: Secondary | ICD-10-CM | POA: Diagnosis not present

## 2022-01-26 DIAGNOSIS — Z9181 History of falling: Secondary | ICD-10-CM | POA: Diagnosis not present

## 2022-05-21 DIAGNOSIS — R197 Diarrhea, unspecified: Secondary | ICD-10-CM | POA: Diagnosis not present

## 2022-05-21 DIAGNOSIS — M797 Fibromyalgia: Secondary | ICD-10-CM | POA: Diagnosis not present

## 2022-05-21 DIAGNOSIS — R2689 Other abnormalities of gait and mobility: Secondary | ICD-10-CM | POA: Diagnosis not present

## 2022-05-21 DIAGNOSIS — G8929 Other chronic pain: Secondary | ICD-10-CM | POA: Diagnosis not present

## 2022-05-21 DIAGNOSIS — K219 Gastro-esophageal reflux disease without esophagitis: Secondary | ICD-10-CM | POA: Diagnosis not present

## 2022-05-21 DIAGNOSIS — R69 Illness, unspecified: Secondary | ICD-10-CM | POA: Diagnosis not present

## 2022-05-21 DIAGNOSIS — E039 Hypothyroidism, unspecified: Secondary | ICD-10-CM | POA: Diagnosis not present

## 2022-08-08 DIAGNOSIS — M549 Dorsalgia, unspecified: Secondary | ICD-10-CM | POA: Diagnosis not present

## 2022-08-08 DIAGNOSIS — R69 Illness, unspecified: Secondary | ICD-10-CM | POA: Diagnosis not present

## 2022-08-08 DIAGNOSIS — Z79899 Other long term (current) drug therapy: Secondary | ICD-10-CM | POA: Diagnosis not present

## 2022-08-08 DIAGNOSIS — M1990 Unspecified osteoarthritis, unspecified site: Secondary | ICD-10-CM | POA: Diagnosis not present

## 2022-08-08 DIAGNOSIS — M797 Fibromyalgia: Secondary | ICD-10-CM | POA: Diagnosis not present

## 2022-08-08 DIAGNOSIS — M79671 Pain in right foot: Secondary | ICD-10-CM | POA: Diagnosis not present

## 2022-08-08 DIAGNOSIS — M064 Inflammatory polyarthropathy: Secondary | ICD-10-CM | POA: Diagnosis not present

## 2022-08-08 DIAGNOSIS — T65891A Toxic effect of other specified substances, accidental (unintentional), initial encounter: Secondary | ICD-10-CM | POA: Diagnosis not present

## 2022-09-06 DIAGNOSIS — R2689 Other abnormalities of gait and mobility: Secondary | ICD-10-CM | POA: Diagnosis not present

## 2022-09-06 DIAGNOSIS — I10 Essential (primary) hypertension: Secondary | ICD-10-CM | POA: Diagnosis not present

## 2022-09-06 DIAGNOSIS — R69 Illness, unspecified: Secondary | ICD-10-CM | POA: Diagnosis not present

## 2022-09-06 DIAGNOSIS — M064 Inflammatory polyarthropathy: Secondary | ICD-10-CM | POA: Diagnosis not present

## 2022-09-06 DIAGNOSIS — R251 Tremor, unspecified: Secondary | ICD-10-CM | POA: Diagnosis not present

## 2022-09-06 DIAGNOSIS — Z0001 Encounter for general adult medical examination with abnormal findings: Secondary | ICD-10-CM | POA: Diagnosis not present

## 2022-09-06 DIAGNOSIS — E039 Hypothyroidism, unspecified: Secondary | ICD-10-CM | POA: Diagnosis not present

## 2022-09-06 DIAGNOSIS — Z79899 Other long term (current) drug therapy: Secondary | ICD-10-CM | POA: Diagnosis not present

## 2022-09-06 DIAGNOSIS — D329 Benign neoplasm of meninges, unspecified: Secondary | ICD-10-CM | POA: Diagnosis not present

## 2022-09-06 DIAGNOSIS — E559 Vitamin D deficiency, unspecified: Secondary | ICD-10-CM | POA: Diagnosis not present

## 2022-09-06 DIAGNOSIS — D801 Nonfamilial hypogammaglobulinemia: Secondary | ICD-10-CM | POA: Diagnosis not present

## 2022-09-06 DIAGNOSIS — M797 Fibromyalgia: Secondary | ICD-10-CM | POA: Diagnosis not present

## 2022-09-06 DIAGNOSIS — K219 Gastro-esophageal reflux disease without esophagitis: Secondary | ICD-10-CM | POA: Diagnosis not present

## 2022-09-11 ENCOUNTER — Other Ambulatory Visit: Payer: Self-pay | Admitting: Family Medicine

## 2022-09-11 DIAGNOSIS — D329 Benign neoplasm of meninges, unspecified: Secondary | ICD-10-CM

## 2022-09-11 DIAGNOSIS — E2839 Other primary ovarian failure: Secondary | ICD-10-CM

## 2022-09-24 DIAGNOSIS — Z79899 Other long term (current) drug therapy: Secondary | ICD-10-CM | POA: Diagnosis not present

## 2022-09-24 DIAGNOSIS — Z0000 Encounter for general adult medical examination without abnormal findings: Secondary | ICD-10-CM | POA: Diagnosis not present

## 2022-09-24 DIAGNOSIS — I10 Essential (primary) hypertension: Secondary | ICD-10-CM | POA: Diagnosis not present

## 2022-10-03 ENCOUNTER — Encounter: Payer: Self-pay | Admitting: Family Medicine

## 2022-11-06 ENCOUNTER — Ambulatory Visit
Admission: RE | Admit: 2022-11-06 | Discharge: 2022-11-06 | Disposition: A | Discharge status: Home / Self Care | Payer: Medicare HMO | Source: Ambulatory Visit | Attending: Family Medicine | Admitting: Family Medicine

## 2022-11-06 DIAGNOSIS — D329 Benign neoplasm of meninges, unspecified: Secondary | ICD-10-CM

## 2022-12-31 DIAGNOSIS — R251 Tremor, unspecified: Secondary | ICD-10-CM | POA: Diagnosis not present

## 2022-12-31 DIAGNOSIS — M064 Inflammatory polyarthropathy: Secondary | ICD-10-CM | POA: Diagnosis not present

## 2022-12-31 DIAGNOSIS — Z79899 Other long term (current) drug therapy: Secondary | ICD-10-CM | POA: Diagnosis not present

## 2022-12-31 DIAGNOSIS — I10 Essential (primary) hypertension: Secondary | ICD-10-CM | POA: Diagnosis not present

## 2022-12-31 DIAGNOSIS — M069 Rheumatoid arthritis, unspecified: Secondary | ICD-10-CM | POA: Diagnosis not present

## 2023-03-18 DIAGNOSIS — E039 Hypothyroidism, unspecified: Secondary | ICD-10-CM | POA: Diagnosis not present

## 2023-04-08 ENCOUNTER — Inpatient Hospital Stay: Admission: RE | Admit: 2023-04-08 | Payer: Medicare HMO | Source: Ambulatory Visit

## 2023-09-09 DIAGNOSIS — I872 Venous insufficiency (chronic) (peripheral): Secondary | ICD-10-CM | POA: Diagnosis not present

## 2023-09-09 DIAGNOSIS — I10 Essential (primary) hypertension: Secondary | ICD-10-CM | POA: Diagnosis not present

## 2023-09-09 DIAGNOSIS — L659 Nonscarring hair loss, unspecified: Secondary | ICD-10-CM | POA: Diagnosis not present

## 2023-10-13 DIAGNOSIS — F329 Major depressive disorder, single episode, unspecified: Secondary | ICD-10-CM | POA: Diagnosis not present

## 2023-10-13 DIAGNOSIS — T65891A Toxic effect of other specified substances, accidental (unintentional), initial encounter: Secondary | ICD-10-CM | POA: Diagnosis not present

## 2023-10-13 DIAGNOSIS — M797 Fibromyalgia: Secondary | ICD-10-CM | POA: Diagnosis not present

## 2023-10-13 DIAGNOSIS — Z79899 Other long term (current) drug therapy: Secondary | ICD-10-CM | POA: Diagnosis not present

## 2023-10-13 DIAGNOSIS — M064 Inflammatory polyarthropathy: Secondary | ICD-10-CM | POA: Diagnosis not present

## 2023-10-13 DIAGNOSIS — M549 Dorsalgia, unspecified: Secondary | ICD-10-CM | POA: Diagnosis not present

## 2023-10-13 DIAGNOSIS — M79671 Pain in right foot: Secondary | ICD-10-CM | POA: Diagnosis not present

## 2023-10-13 DIAGNOSIS — M1990 Unspecified osteoarthritis, unspecified site: Secondary | ICD-10-CM | POA: Diagnosis not present

## 2023-10-23 DIAGNOSIS — H35363 Drusen (degenerative) of macula, bilateral: Secondary | ICD-10-CM | POA: Diagnosis not present

## 2023-10-23 DIAGNOSIS — Z0100 Encounter for examination of eyes and vision without abnormal findings: Secondary | ICD-10-CM | POA: Diagnosis not present

## 2023-10-23 DIAGNOSIS — H35371 Puckering of macula, right eye: Secondary | ICD-10-CM | POA: Diagnosis not present

## 2023-10-23 DIAGNOSIS — H43813 Vitreous degeneration, bilateral: Secondary | ICD-10-CM | POA: Diagnosis not present

## 2023-10-23 DIAGNOSIS — Z961 Presence of intraocular lens: Secondary | ICD-10-CM | POA: Diagnosis not present

## 2024-01-14 ENCOUNTER — Ambulatory Visit (INDEPENDENT_AMBULATORY_CARE_PROVIDER_SITE_OTHER): Admitting: Family

## 2024-01-14 ENCOUNTER — Encounter: Payer: Self-pay | Admitting: Family

## 2024-01-14 VITALS — BP 152/106 | HR 76 | Temp 97.7°F | Resp 20 | Ht 64.0 in | Wt 174.4 lb

## 2024-01-14 DIAGNOSIS — H6121 Impacted cerumen, right ear: Secondary | ICD-10-CM

## 2024-01-14 DIAGNOSIS — I10 Essential (primary) hypertension: Secondary | ICD-10-CM

## 2024-01-14 DIAGNOSIS — F411 Generalized anxiety disorder: Secondary | ICD-10-CM

## 2024-01-14 DIAGNOSIS — F320 Major depressive disorder, single episode, mild: Secondary | ICD-10-CM | POA: Diagnosis not present

## 2024-01-14 DIAGNOSIS — M797 Fibromyalgia: Secondary | ICD-10-CM | POA: Diagnosis not present

## 2024-01-14 DIAGNOSIS — M25551 Pain in right hip: Secondary | ICD-10-CM

## 2024-01-14 DIAGNOSIS — E039 Hypothyroidism, unspecified: Secondary | ICD-10-CM

## 2024-01-14 DIAGNOSIS — G8929 Other chronic pain: Secondary | ICD-10-CM

## 2024-01-14 DIAGNOSIS — M545 Low back pain, unspecified: Secondary | ICD-10-CM

## 2024-01-14 MED ORDER — DEBROX 6.5 % OT SOLN: 5.0000 [drp] | Freq: Two times a day (BID) | OTIC | 0 refills | Status: AC

## 2024-01-14 MED ORDER — LOSARTAN POTASSIUM 25 MG PO TABS: 25.0000 mg | ORAL_TABLET | Freq: Every day | ORAL | 3 refills | Status: DC

## 2024-01-14 NOTE — Progress Notes (Signed)
 Provider: Roxan Plough FNP-C   Tikia Skilton, Roxan BROCKS, NP  Patient Care Team: Deb Loudin, Roxan BROCKS, NP as PCP - General (Family Medicine) Lenon Faden, MD as Consulting Physician (Rheumatology) Lonn Hicks, MD as Consulting Physician (Hematology and Oncology)  Extended Emergency Contact Information Primary Emergency Contact: Cleatus Kindler  [place] Home Phone: [phone] Relation: Daughter Secondary Emergency Contact: Iris Kemps  [place] Home Phone: [phone] Relation: Relative  Code Status:  Full Code  Goals of care: Advanced Directive information    01/14/2024   10:13 AM  Advanced Directives  Does Patient Have a Medical Advance Directive? Yes  Type of Estate agent of [place];Living will  Does patient want to make changes to medical advance directive? No - Patient declined  Copy of Healthcare Power of Attorney in Chart? No - copy requested     Chief Complaint  Patient presents with   Establish Care    New Patient Appointment    Discussed the use of AI scribe software for clinical note transcription with the patient, who gave verbal consent to proceed.  History of Present Illness   Belinda Mcdowell is an 83 year old female who presents for geriatric care to address chronic pain and medication management. She is accompanied by her daughter, who is advocating for her care.  She experiences chronic fibromyalgia, resulting in severe pain and prolonged periods of immobility. The pain has increased in frequency with age, leading to many days spent in bed. She takes a significant amount of anti-inflammatories but finds them ineffective in improving her quality of life. Her primary care physician has been unable to prescribe opioids, leaving her without adequate pain relief. Tramadol is ineffective for her pain, and she rarely uses it, preferring ibuprofen.  She has a history of high cholesterol and was previously on a  statin, which she discontinued due to severe muscle weakness. She also stopped her blood pressure medication, amlodipine, due to significant swelling in her legs and wrists. She has not been offered alternative medications for these conditions and currently uses hibiscus tea as a natural remedy for blood pressure control.  She has a history of non-Hodgkin's lymphoma, which she believes is under control, although she has not seen an oncologist for several years. She expresses reluctance to undergo further chemotherapy if the lymphoma recurs.  Her current medications include methotrexate 10 mg weekly, omeprazole daily, trazodone  150 mg at bedtime, tizanidine  4 mg as needed, alprazolam  0.5 mg twice daily, tramadol 100 mg as needed, levothyroxine 25 mcg daily, Cymbalta 30 mg twice daily, Wellbutrin 150 mg daily, and gabapentin 300 mg as needed.  She experiences chronic depression, managed with Wellbutrin and Cymbalta, although Cymbalta has not been effective for her fibromyalgia pain. She has a history of silicone injections in her face, leading to long-term methotrexate use to manage adverse reactions.  She reports constant pain from her gluteal region to her hip and lower back, which can be debilitating. She has not had any imaging or orthopedic evaluation for this pain. She maintains some mobility through physical therapy exercises at home but finds it challenging to engage in regular physical activity due to fatigue and pain.  Her social history includes close support from her daughter. She has a long history of yoga practice but currently struggles with fatigue and lack of motivation to engage in exercise. No memory problems, no numbness or tingling in legs, reports knee pain, denies allergies to medications.   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 fib   Sinus bradycardia    UTI (lower urinary tract infection)    Past Surgical History:  Procedure Laterality Date   ABDOMINAL HYSTERECTOMY     for dysfunctional bleeding   APPENDECTOMY      No Known Allergies  Allergies as of 01/14/2024   No Known Allergies      Medication List        Accurate as of January 14, 2024  9:34 PM. If you have any questions, ask your nurse or doctor.          STOP taking these medications    amLODipine 10 MG tablet Commonly known as: NORVASC Stopped by: Adelita Hone C Caci Orren   cetirizine  10 MG tablet Commonly known as: ZYRTEC  Stopped by: Amaro Mangold C Evalyne Cortopassi   famotidine  20 MG tablet Commonly known as: Pepcid  Stopped by: Ruqaya Strauss C Farren Nelles   methotrexate 2.5 MG tablet Commonly known as: RHEUMATREX Stopped by: Kang Ishida C Treyvion Durkee   PARoxetine  20 MG tablet Commonly known as: Paxil  Stopped by: Lilac Hoff C Destenie Ingber       TAKE these medications    ALPRAZolam  0.5 MG tablet Commonly known as: Xanax  Take 1 tablet (0.5 mg total) by mouth 2 (two) times daily as needed for anxiety.   buPROPion 150 MG 24 hr tablet Commonly known as: WELLBUTRIN XL Take 150 mg by mouth every morning.   Debrox 6.5 % OTIC solution Generic drug: carbamide peroxide Place 5 drops  into the right ear 2 (two) times daily for 4 days. Started by: Bryam Taborda C Tamora Huneke   DULoxetine 30 MG capsule Commonly known as: CYMBALTA Take 30 mg by mouth 2 (two) times daily.   folic acid  400 MCG tablet Commonly known as: FOLVITE  Take 400 mcg by mouth daily.   gabapentin 300 MG capsule Commonly known as: NEURONTIN Take 600 mg by mouth daily.   ibuprofen 600 MG tablet Commonly known as: ADVIL Take 600 mg by mouth as needed.   levothyroxine 25 MCG tablet Commonly known as: SYNTHROID Take 25 mcg by mouth daily.   losartan  25 MG tablet Commonly known as: Cozaar  Take 1 tablet (25 mg total) by mouth daily. Started by: Yahayra Geis C Hakeem Frazzini   methotrexate 2.5 MG tablet Commonly known as: RHEUMATREX Take 10 mg by mouth once a week. Tuesdays   omeprazole 40 MG capsule Commonly known as: PRILOSEC Take 40 mg by mouth every morning.   primidone 50 MG tablet Commonly known as: MYSOLINE Take 50 mg by mouth 2 (two) times daily.   tiZANidine   4 MG tablet Commonly known as: Zanaflex  Take 1 tablet (4 mg total) by mouth every 8 (eight) hours as needed.   traMADol 50 MG tablet Commonly known as: ULTRAM Take 100 mg by mouth every 6 (six) hours as needed.   traZODone  150 MG tablet Commonly known as: DESYREL  Take 150 mg by mouth at bedtime. What changed: Another medication with the same name was removed. Continue taking this medication, and follow the directions you see here. Changed by: Roxan BROCKS Micheala Morissette        Review of Systems  Constitutional:  Negative for appetite change, chills, fatigue, fever and unexpected weight change.  HENT:  Negative for congestion, dental problem, ear discharge, ear pain, facial swelling, hearing loss, nosebleeds, postnasal drip, rhinorrhea, sinus pressure, sinus pain, sneezing, sore throat, tinnitus and trouble swallowing.   Eyes:  Negative for pain, discharge, redness, itching and visual disturbance.  Respiratory:  Negative for cough, chest tightness,  shortness of breath and wheezing.   Cardiovascular:  Negative for chest pain, palpitations and leg swelling.  Gastrointestinal:  Negative for abdominal distention, abdominal pain, blood in stool, constipation, diarrhea, nausea and vomiting.  Endocrine: Negative for cold intolerance, heat intolerance, polydipsia, polyphagia and polyuria.  Genitourinary:  Negative for difficulty urinating, dysuria, flank pain, frequency and urgency.  Musculoskeletal:  Positive for arthralgias, back pain, gait problem and myalgias. Negative for joint swelling, neck pain and neck stiffness.  Skin:  Negative for color change, pallor, rash and wound.  Neurological:  Negative for dizziness, syncope, speech difficulty, weakness, light-headedness, numbness and headaches.  Hematological:  Does not bruise/bleed easily.  Psychiatric/Behavioral:  Negative for agitation, behavioral problems, confusion, hallucinations, self-injury, sleep disturbance and suicidal ideas. The patient is not nervous/anxious.     Immunization History  Administered Date(s) Administered   Fluad Quad(high Dose 65+) 02/04/2019   Influenza Split 02/27/2012   Influenza,inj,Quad PF,6+ Mos 02/25/2013, 03/17/2014, 03/02/2015   Influenza-Unspecified 02/20/2016, 02/15/2022, 03/18/2023   Pneumococcal Conjugate-13 10/26/2003, 06/10/2013   Pneumococcal Polysaccharide-23 05/27/2004   Td 06/09/2014   Tdap 10/25/2004   Unspecified SARS-COV-2 Vaccination 06/30/2019, 07/21/2019, 03/30/2020, 03/18/2023   Zoster, Live 01/26/2003   Pertinent  Health Maintenance Due  Topic Date Due   DEXA SCAN  Completed      12/08/2012    2:26 PM 10/04/2014    4:05 PM 01/14/2024   10:12 AM  Fall Risk  Falls in the past year? No  Yes  0  Was there an injury with Fall?  Yes  0  Was there an injury with Fall? - Comments  fell and broke foot in 3 parts; only fx and fall last year    Fall Risk Category Calculator   0  Patient at Risk for Falls Due to   No Fall Risks  Fall  risk Follow up  Education provided  Falls evaluation completed  Fall risk Follow up - Comments  discussed dexa scan and the patient agreed       Data saved with a previous flowsheet row definition   Functional Status Survey:    Vitals:   01/14/24 1027 01/14/24 1035  BP: (!) 160/110 (!) 152/106  Pulse: 76   Resp: 20   Temp: 97.7 F (36.5 C)   SpO2: 93%   Weight: 174 lb 6.4 oz (79.1 kg)   Height: 5' 4 (1.626 m)    Body mass index is 29.94 kg/m. Physical Exam VITALS: T- 97.7, P- 76, BP- 160/100, SaO2- 93% MEASUREMENTS: Weight- 174. GENERAL: Alert, cooperative, well developed, no  acute distress. HEENT: Normocephalic, normal oropharynx, moist mucous membranes. Right ear canal occluded with cerumen, left ear canal clear. NECK: Supple, no tenderness. CHEST: Clear to auscultation bilaterally. No wheezes, rhonchi, or crackles. CARDIOVASCULAR: Normal heart rate and rhythm. S1 and S2 normal without murmurs. ABDOMEN: Soft, non-tender, non-distended, without organomegaly. Normal bowel sounds. EXTREMITIES: No cyanosis or edema. MUSCULOSKELETAL: Left knee pain, right knee normal. Lower back tenderness. NEUROLOGICAL: Cranial nerves grossly intact. Moves all extremities without gross motor or sensory deficit.      Labs reviewed: No results for input(s): NA, K, CL, CO2, GLUCOSE, BUN, CREATININE, CALCIUM , MG, PHOS in the last 8760 hours. No results for input(s): AST, ALT, ALKPHOS, BILITOT, PROT, ALBUMIN in the last 8760 hours. No results for input(s): WBC, NEUTROABS, HGB, HCT, MCV, PLT in the last 8760 hours. Lab Results  Component Value Date   TSH 2.279 02/21/2015   No results found for: HGBA1C Lab Results  Component Value Date   CHOL 126 12/02/2014   HDL 40.30 12/02/2014   LDLCALC 55 12/02/2014   LDLDIRECT 148.7 06/30/2012   TRIG 156.0 (H) 12/02/2014   CHOLHDL 3 12/02/2014    Significant Diagnostic Results in last 30 days:  No  results found.  Assessment/Plan Assessment and Plan    Fibromyalgia with chronic pain syndrome Chronic fibromyalgia with frequent flare-ups leading to significant pain and reduced mobility. Current management with ibuprofen and tramadol is inadequate. Tramadol is ineffective, and ibuprofen is preferred. Gabapentin is used for additional pain relief but causes sedation. Cymbalta prescribed for pain and depression but ineffective for fibromyalgia pain. Referral to pain management is necessary for better management of chronic pain. - Refer to pain management specialist for comprehensive pain management. - Continue ibuprofen as needed for pain. - Consider alternative pain management strategies at the pain clinic.  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 Pain is constant and affects mobility. No prior imaging or orthopedic evaluation. - Order x-ray of the right hip and lumbar spine to assess for underlying pathology. - Consider referral to orthopedic specialist based on x-ray findings.  Left knee osteoarthritis Chronic left knee pain due to osteoarthritis. Pain is significant, affecting mobility.  Hypertension Hypertension previously managed with amlodipine, which caused significant leg and wrist swelling. Blood pressure remains elevated at 160/100 mmHg. No history of kidney issues. Losartan  is considered as an alternative due to its lower risk of causing edema. - Prescribe losartan  25 mg once daily. - Recheck blood pressure in two weeks to assess efficacy of losartan .  Hyperlipidemia, statin intolerance Statin intolerance due to muscle weakness.  Hypothyroidism Hypothyroidism managed with levothyroxine. - Continue current levothyroxine regimen.  Major depressive disorder and insomnia Chronic depression managed with Wellbutrin, which is effective. Cymbalta was added for fibromyalgia  pain but is ineffective for this purpose. Insomnia is present, with altered sleep patterns, staying awake at night and sleeping during the day. - Continue Wellbutrin for depression. - Discuss sleep hygiene and encourage regular sleep patterns.  Non-Hodgkin's lymphoma in remission Non-Hodgkin's lymphoma in remission. She has not followed up with oncology for several years. She expresses a preference not to pursue further chemotherapy if recurrence occurs. - Encourage annual follow-up with oncologist for monitoring.  Impacted cerumen, right ear Impacted cerumen in the right ear, obstructing view of the eardrum. - Recommend Debrox ear drops, 5 drops in the morning and evening for 4 days. - Schedule follow-up for ear cleaning after  using Debrox.  General Health Maintenance Immunizations are up to date, including COVID boosters, shingles, and pneumonia vaccines. Next flu shot is due in September or October. - Ensure flu vaccination is administered in September or October.   Family/ staff Communication: Reviewed plan of care with patient verbalized understanding   Labs/tests ordered:  - dg lumbar  - Dg right hip   Next Appointment : Return in about 1 week (around 01/21/2024) for right ear lavage .   Spent 45 minutes of Face to face and non-face to face with patient  >50% time spent counseling; reviewing medical record; tests; labs; documentation and developing future plan of care.   Roxan JAYSON Plough, NP

## 2024-01-20 DIAGNOSIS — M1990 Unspecified osteoarthritis, unspecified site: Secondary | ICD-10-CM | POA: Diagnosis not present

## 2024-01-20 DIAGNOSIS — M797 Fibromyalgia: Secondary | ICD-10-CM | POA: Diagnosis not present

## 2024-01-20 DIAGNOSIS — Z79899 Other long term (current) drug therapy: Secondary | ICD-10-CM | POA: Diagnosis not present

## 2024-01-20 DIAGNOSIS — M549 Dorsalgia, unspecified: Secondary | ICD-10-CM | POA: Diagnosis not present

## 2024-01-20 DIAGNOSIS — T65891A Toxic effect of other specified substances, accidental (unintentional), initial encounter: Secondary | ICD-10-CM | POA: Diagnosis not present

## 2024-01-22 ENCOUNTER — Encounter: Admitting: Family

## 2024-01-23 NOTE — Progress Notes (Signed)
   This encounter was created in error - please disregard. No show

## 2024-03-01 DIAGNOSIS — M797 Fibromyalgia: Secondary | ICD-10-CM | POA: Diagnosis not present

## 2024-03-01 DIAGNOSIS — M533 Sacrococcygeal disorders, not elsewhere classified: Secondary | ICD-10-CM | POA: Diagnosis not present

## 2024-03-03 NOTE — Progress Notes (Addendum)
 Belinda Mcdowell                                          MRN: [id number]   06/08/2024   The [facility] Quality Team Specialist reviewed this patient medical record for the purposes of chart review for care gap closure. The following were reviewed: chart review for care gap closure-controlling blood pressure.    [facility] Quality Team

## 2024-03-10 DIAGNOSIS — M533 Sacrococcygeal disorders, not elsewhere classified: Secondary | ICD-10-CM | POA: Diagnosis not present

## 2024-04-10 ENCOUNTER — Other Ambulatory Visit: Payer: Self-pay | Admitting: Family

## 2024-04-10 DIAGNOSIS — I10 Essential (primary) hypertension: Secondary | ICD-10-CM

## 2024-05-03 DIAGNOSIS — R5383 Other fatigue: Secondary | ICD-10-CM | POA: Diagnosis not present

## 2024-05-03 DIAGNOSIS — T65894D Toxic effect of other specified substances, undetermined, subsequent encounter: Secondary | ICD-10-CM | POA: Diagnosis not present

## 2024-05-03 DIAGNOSIS — Z6836 Body mass index (BMI) 36.0-36.9, adult: Secondary | ICD-10-CM | POA: Diagnosis not present

## 2024-05-03 DIAGNOSIS — M2559 Pain in other specified joint: Secondary | ICD-10-CM | POA: Diagnosis not present

## 2024-05-03 DIAGNOSIS — M1991 Primary osteoarthritis, unspecified site: Secondary | ICD-10-CM | POA: Diagnosis not present

## 2024-05-03 DIAGNOSIS — M797 Fibromyalgia: Secondary | ICD-10-CM | POA: Diagnosis not present

## 2024-05-03 DIAGNOSIS — M7989 Other specified soft tissue disorders: Secondary | ICD-10-CM | POA: Diagnosis not present

## 2024-05-03 DIAGNOSIS — E669 Obesity, unspecified: Secondary | ICD-10-CM | POA: Diagnosis not present

## 2024-05-03 DIAGNOSIS — R7989 Other specified abnormal findings of blood chemistry: Secondary | ICD-10-CM | POA: Diagnosis not present

## 2024-05-03 DIAGNOSIS — M5136 Other intervertebral disc degeneration, lumbar region with discogenic back pain only: Secondary | ICD-10-CM | POA: Diagnosis not present

## 2024-05-04 DIAGNOSIS — Z79899 Other long term (current) drug therapy: Secondary | ICD-10-CM | POA: Diagnosis not present

## 2024-05-10 DIAGNOSIS — M533 Sacrococcygeal disorders, not elsewhere classified: Secondary | ICD-10-CM | POA: Diagnosis not present

## 2024-05-10 DIAGNOSIS — G8929 Other chronic pain: Secondary | ICD-10-CM | POA: Diagnosis not present

## 2024-05-10 DIAGNOSIS — M545 Low back pain, unspecified: Secondary | ICD-10-CM | POA: Diagnosis not present
